# Patient Record
Sex: Male | Born: 1975 | Race: Black or African American | Hispanic: No | Marital: Single | Smoking: Never smoker
Health system: Southern US, Community
[De-identification: ages and names within clinical notes are randomized; demographics above are authoritative.]

## PROBLEM LIST (undated history)

## (undated) DIAGNOSIS — I10 Essential (primary) hypertension: Secondary | ICD-10-CM

## (undated) DIAGNOSIS — J45909 Unspecified asthma, uncomplicated: Secondary | ICD-10-CM

## (undated) DIAGNOSIS — E119 Type 2 diabetes mellitus without complications: Secondary | ICD-10-CM

## (undated) HISTORY — DX: Type 2 diabetes mellitus without complications: E11.9

## (undated) HISTORY — PX: DENTAL SURGERY: SHX609

## (undated) HISTORY — DX: Unspecified asthma, uncomplicated: J45.909

---

## 2015-01-03 ENCOUNTER — Encounter (HOSPITAL_COMMUNITY): Payer: Self-pay | Admitting: Family Medicine

## 2015-01-03 ENCOUNTER — Emergency Department (HOSPITAL_COMMUNITY)
Admission: EM | Admit: 2015-01-03 | Discharge: 2015-01-03 | Disposition: A | Discharge status: Home / Self Care | Payer: Self-pay | Attending: Emergency Medicine | Admitting: Emergency Medicine

## 2015-01-03 DIAGNOSIS — Z79899 Other long term (current) drug therapy: Secondary | ICD-10-CM | POA: Insufficient documentation

## 2015-01-03 DIAGNOSIS — I10 Essential (primary) hypertension: Secondary | ICD-10-CM | POA: Insufficient documentation

## 2015-01-03 HISTORY — DX: Essential (primary) hypertension: I10

## 2015-01-03 LAB — I-STAT CHEM 8, ED
BUN: 11 mg/dL (ref 6–20)
CHLORIDE: 100 mmol/L — AB (ref 101–111)
Calcium, Ion: 1.1 mmol/L — ABNORMAL LOW (ref 1.12–1.23)
Creatinine, Ser: 1.1 mg/dL (ref 0.61–1.24)
GLUCOSE: 102 mg/dL — AB (ref 65–99)
HEMATOCRIT: 44 % (ref 39.0–52.0)
Hemoglobin: 15 g/dL (ref 13.0–17.0)
POTASSIUM: 3.5 mmol/L (ref 3.5–5.1)
SODIUM: 138 mmol/L (ref 135–145)
TCO2: 24 mmol/L (ref 0–100)

## 2015-01-03 MED ORDER — LISINOPRIL-HYDROCHLOROTHIAZIDE 10-12.5 MG PO TABS: 1.0000 | ORAL_TABLET | Freq: Every day | ORAL | Status: DC

## 2015-01-03 MED ORDER — LISINOPRIL 10 MG PO TABS
10.0000 mg | ORAL_TABLET | Freq: Every day | ORAL | Status: DC
  Administered 2015-01-03: 10 mg via ORAL
  Filled 2015-01-03: qty 1

## 2015-01-03 NOTE — ED Provider Notes (Signed)
CSN: [id number]     Arrival date & time 01/03/15  1025 History   First MD Initiated Contact with Patient 01/03/15 1342     Chief Complaint  Patient presents with  . Hypertension   HPI Patient presents to the emergency room for evaluation of high blood pressure. Patient has a history of hypertension. He was living in [place]. When he was living there he was on medications for blood pressure. Patient states he has not been on them for a long period of time. He cannot recall what medications he used to take. He took his blood pressure at [facility] on Friday. His systolic blood pressure was greater than 200. Patient decided to come in for evaluation. Denies having any complaints. He denies chest pain or shortness of breath. He does not have any trouble with numbness or weakness or blurred vision. He otherwise feels fine but knows he shouldn't be taking medications for his blood pressure. He does not have a doctor in this area. Past Medical History  Diagnosis Date  . Hypertension    History reviewed. No pertinent past surgical history. History reviewed. No pertinent family history. History  Substance Use Topics  . Smoking status: Never Smoker   . Smokeless tobacco: Not on file  . Alcohol Use: No    Review of Systems  All other systems reviewed and are negative.     Allergies  Review of patient's allergies indicates no known allergies.  Home Medications   Prior to Admission medications   Medication Sig Start Date End Date Taking? Authorizing Provider  albuterol (PROVENTIL HFA;VENTOLIN HFA) 108 (90 BASE) MCG/ACT inhaler Inhale 1 puff into the lungs every 6 (six) hours as needed for wheezing or shortness of breath.   Yes Historical Provider, MD  budesonide-formoterol (SYMBICORT) 160-4.5 MCG/ACT inhaler Inhale 2 puffs into the lungs 2 (two) times daily.   Yes Historical Provider, MD  lisinopril-hydrochlorothiazide (PRINZIDE,ZESTORETIC) 10-12.5 MG per tablet Take 1 tablet by mouth daily.  01/03/15   Dorie Rank, MD   BP 199/114 mmHg  Pulse 77  Temp(Src) 97.8 F (36.6 C) (Oral)  Resp 18  SpO2 98% Physical Exam  Constitutional: He appears well-developed and well-nourished. No distress.  HENT:  Head: Normocephalic and atraumatic.  Right Ear: External ear normal.  Left Ear: External ear normal.  Eyes: Conjunctivae are normal. Right eye exhibits no discharge. Left eye exhibits no discharge. No scleral icterus.  Neck: Neck supple. No tracheal deviation present.  Cardiovascular: Normal rate, regular rhythm and normal heart sounds.   Pulmonary/Chest: Effort normal and breath sounds normal. No stridor. No respiratory distress. He has no wheezes. He has no rales.  Musculoskeletal: He exhibits no edema.  Neurological: He is alert. Cranial nerve deficit: no gross deficits.  Skin: Skin is warm and dry.  Patches of dry skin  Psychiatric: He has a normal mood and affect.  Nursing note and vitals reviewed.   ED Course  Procedures (including critical care time) Labs Review Labs Reviewed  I-STAT CHEM 8, ED - Abnormal; Notable for the following:    Chloride 100 (*)    Glucose, Bld 102 (*)    Calcium, Ion 1.10 (*)    All other components within normal limits    Imaging Review No results found.   EKG Interpretation   Date/Time:  Monday January 03 2015 14:34:00 EDT Ventricular Rate:  73 PR Interval:  190 QRS Duration: 94 QT Interval:  443 QTC Calculation: 488 R Axis:   -35 Text Interpretation:  Sinus rhythm Left ventricular hypertrophy Borderline  prolonged QT interval No old tracing to compare Confirmed by Diyana Starrett  MD-J,  Oiva Dibari ([id number]) on 01/03/2015 2:56:51 PM      MDM   Final diagnoses:  Essential hypertension    Pt with  asymptomatic hypertension.  Was supposed to be on meds but stopped taking them.  Will give dose of lisinopril.  Rx lisinopril HCTZ.  Referral to [facility].    Dorie Rank, MD 01/03/15 [phone]

## 2015-01-03 NOTE — ED Notes (Signed)
Pt is in stable condition upon d/c and ambulates from ED. 

## 2015-01-03 NOTE — ED Notes (Signed)
Pt sts he checked BP at [facility] and was above 200 sys. sts he feels fine. Denies any other problems.

## 2015-01-03 NOTE — Discharge Instructions (Signed)

## 2016-08-07 ENCOUNTER — Encounter (HOSPITAL_COMMUNITY): Payer: Self-pay | Admitting: Emergency Medicine

## 2016-08-07 ENCOUNTER — Emergency Department (HOSPITAL_COMMUNITY)
Admission: EM | Admit: 2016-08-07 | Discharge: 2016-08-07 | Disposition: A | Discharge status: Home / Self Care | Payer: PRIVATE HEALTH INSURANCE | Attending: Emergency Medicine | Admitting: Emergency Medicine

## 2016-08-07 DIAGNOSIS — Z760 Encounter for issue of repeat prescription: Secondary | ICD-10-CM | POA: Insufficient documentation

## 2016-08-07 DIAGNOSIS — I10 Essential (primary) hypertension: Secondary | ICD-10-CM | POA: Diagnosis not present

## 2016-08-07 MED ORDER — LISINOPRIL-HYDROCHLOROTHIAZIDE 20-25 MG PO TABS: 1.0000 | ORAL_TABLET | Freq: Every day | ORAL | 0 refills | Status: DC

## 2016-08-07 MED ORDER — AMLODIPINE BESYLATE 10 MG PO TABS: 10.0000 mg | ORAL_TABLET | Freq: Every day | ORAL | 0 refills | Status: DC

## 2016-08-07 MED ORDER — CLONIDINE HCL 0.1 MG PO TABS: 0.1000 mg | ORAL_TABLET | Freq: Two times a day (BID) | ORAL | 1 refills | Status: DC

## 2016-08-07 NOTE — ED Notes (Signed)
Pt states all

## 2016-08-07 NOTE — Progress Notes (Signed)
Edward Carr [name], RN, BSN, [name] set up appointment with [name], NP on 1/24 @ 0900.  Spoke with pt at bedside and advised to please arrive 15 min early and take a picture ID and your current medications.  Pt verbalizes understanding of keeping appointment.

## 2016-08-07 NOTE — ED Triage Notes (Signed)
Pt states "Im here to renew my medicine" I had a primary doctor but he shut down in September. Pt states "i just need to fill three blood pressure pills". "ive been out of them since last night". Pt denies symptoms, just requesting  Prescriptions to be filled. Pt takes amlodipine, clonidine, and lisinopril. Pt also requesting resources to find PCP.

## 2016-08-07 NOTE — ED Provider Notes (Signed)
[facility] DEPT Provider Note   CSN: [id number] Arrival date & time: 08/07/16  [id number]     History   Chief Complaint Chief Complaint  Patient presents with  . Medication Refill    HPI Tyvion Torrealba is a 41 y.o. male.  Patient presents to the ED with a chief complaint of needing his BP medications refilled.  He states that he ran out last night.  He doesn't have a PCP.  He denies any symptoms.  There are no modifying factors.   The history is provided by the patient. No language interpreter was used.    Past Medical History:  Diagnosis Date  . Hypertension     There are no active problems to display for this patient.   History reviewed. No pertinent surgical history.     Home Medications    Prior to Admission medications   Medication Sig Start Date End Date Taking? Authorizing Provider  albuterol (PROVENTIL HFA;VENTOLIN HFA) 108 (90 BASE) MCG/ACT inhaler Inhale 1 puff into the lungs every 6 (six) hours as needed for wheezing or shortness of breath.    Historical Provider, MD  budesonide-formoterol (SYMBICORT) 160-4.5 MCG/ACT inhaler Inhale 2 puffs into the lungs 2 (two) times daily.    Historical Provider, MD  lisinopril-hydrochlorothiazide (PRINZIDE,ZESTORETIC) 10-12.5 MG per tablet Take 1 tablet by mouth daily. 01/03/15   Dorie Rank, MD    Family History No family history on file.  Social History Social History  Substance Use Topics  . Smoking status: Never Smoker  . Smokeless tobacco: Not on file  . Alcohol use No     Allergies   Patient has no known allergies.   Review of Systems Review of Systems  Constitutional: Negative for chills and fever.  Respiratory: Negative for shortness of breath.   Cardiovascular: Negative for chest pain.  Gastrointestinal: Negative for constipation, diarrhea, nausea and vomiting.  Genitourinary: Negative for dysuria.     Physical Exam Updated Vital Signs BP (!) 165/105   Pulse 90   Temp 98.2 F (36.8 C) (Oral)    Resp 16   Ht 5\' 5"  (1.651 m)   SpO2 100%   Physical Exam  Constitutional: He is oriented to person, place, and time. No distress.  HENT:  Head: Normocephalic and atraumatic.  Eyes: Conjunctivae and EOM are normal. Pupils are equal, round, and reactive to light.  Neck: No tracheal deviation present.  Cardiovascular: Normal rate.   Pulmonary/Chest: Effort normal. No respiratory distress.  Abdominal: Soft.  Musculoskeletal: Normal range of motion.  Neurological: He is alert and oriented to person, place, and time.  Skin: Skin is warm and dry. He is not diaphoretic.  Psychiatric: Judgment normal.  Nursing note and vitals reviewed.    ED Treatments / Results  Labs (all labs ordered are listed, but only abnormal results are displayed) Labs Reviewed - No data to display  EKG  EKG Interpretation None       Radiology No results found.  Procedures Procedures (including critical care time)  Medications Ordered in ED Medications - No data to display   Initial Impression / Assessment and Plan / ED Course  I have reviewed the triage vital signs and the nursing notes.  Pertinent labs & imaging results that were available during my care of the patient were reviewed by me and considered in my medical decision making (see chart for details).  Clinical Course     Patient here for medication refill.  Case management saw patient and has set up PCP  follow-up.  Meds refilled.  Final Clinical Impressions(s) / ED Diagnoses   Final diagnoses:  Medication refill    New Prescriptions New Prescriptions   No medications on file     Montine Circle, PA-C 08/07/16 [place], MD 08/07/16 2008

## 2016-08-22 ENCOUNTER — Encounter: Payer: Self-pay | Admitting: Family Medicine

## 2016-08-22 ENCOUNTER — Ambulatory Visit (INDEPENDENT_AMBULATORY_CARE_PROVIDER_SITE_OTHER): Payer: PRIVATE HEALTH INSURANCE | Admitting: Family Medicine

## 2016-08-22 DIAGNOSIS — Z23 Encounter for immunization: Secondary | ICD-10-CM

## 2016-08-22 DIAGNOSIS — J45909 Unspecified asthma, uncomplicated: Secondary | ICD-10-CM | POA: Diagnosis not present

## 2016-08-22 DIAGNOSIS — I10 Essential (primary) hypertension: Secondary | ICD-10-CM

## 2016-08-22 DIAGNOSIS — L309 Dermatitis, unspecified: Secondary | ICD-10-CM

## 2016-08-22 HISTORY — DX: Essential (primary) hypertension: I10

## 2016-08-22 LAB — COMPLETE METABOLIC PANEL WITH GFR
ALBUMIN: 4 g/dL (ref 3.6–5.1)
ALK PHOS: 62 U/L (ref 40–115)
ALT: 15 U/L (ref 9–46)
AST: 16 U/L (ref 10–40)
BILIRUBIN TOTAL: 0.6 mg/dL (ref 0.2–1.2)
BUN: 16 mg/dL (ref 7–25)
CO2: 27 mmol/L (ref 20–31)
Calcium: 9.8 mg/dL (ref 8.6–10.3)
Chloride: 100 mmol/L (ref 98–110)
Creat: 1.2 mg/dL (ref 0.60–1.35)
GFR, EST NON AFRICAN AMERICAN: 76 mL/min (ref >=60)
GFR, Est African American: 88 mL/min (ref >=60)
Glucose, Bld: 97 mg/dL (ref 65–99)
Potassium: 4.1 mmol/L (ref 3.5–5.3)
SODIUM: 137 mmol/L (ref 135–146)
TOTAL PROTEIN: 7.2 g/dL (ref 6.1–8.1)

## 2016-08-22 LAB — CBC WITH DIFFERENTIAL/PLATELET
Basophils Absolute: 0 cells/uL (ref 0–200)
Basophils Relative: 0 %
EOS ABS: 258 {cells}/uL (ref 15–500)
Eosinophils Relative: 3 %
HEMATOCRIT: 40.7 % (ref 38.5–50.0)
Hemoglobin: 13.4 g/dL (ref 13.2–17.1)
LYMPHS PCT: 32 %
Lymphs Abs: 2752 cells/uL (ref 850–3900)
MCH: 27 pg (ref 27.0–33.0)
MCHC: 32.9 g/dL (ref 32.0–36.0)
MCV: 82.1 fL (ref 80.0–100.0)
MPV: 8.9 fL (ref 7.5–12.5)
Monocytes Absolute: 516 cells/uL (ref 200–950)
Monocytes Relative: 6 %
NEUTROS PCT: 59 %
Neutro Abs: 5074 cells/uL (ref 1500–7800)
Platelets: 323 10*3/uL (ref 140–400)
RBC: 4.96 MIL/uL (ref 4.20–5.80)
RDW: 15.5 % — AB (ref 11.0–15.0)
WBC: 8.6 10*3/uL (ref 3.8–10.8)

## 2016-08-22 LAB — POCT URINALYSIS DIP (DEVICE)
Glucose, UA: NEGATIVE mg/dL
Hgb urine dipstick: NEGATIVE
Ketones, ur: NEGATIVE mg/dL
LEUKOCYTES UA: NEGATIVE
Nitrite: NEGATIVE
PROTEIN: NEGATIVE mg/dL
Specific Gravity, Urine: 1.025 (ref 1.005–1.030)
Urobilinogen, UA: 0.2 mg/dL (ref 0.0–1.0)
pH: 5.5 (ref 5.0–8.0)

## 2016-08-22 LAB — TSH: TSH: 2.9 mIU/L (ref 0.40–4.50)

## 2016-08-22 LAB — LIPID PANEL
Cholesterol: 146 mg/dL (ref <200)
HDL: 38 mg/dL — AB (ref >40)
LDL CALC: 87 mg/dL (ref <100)
Total CHOL/HDL Ratio: 3.8 Ratio (ref <5.0)
Triglycerides: 107 mg/dL (ref <150)
VLDL: 21 mg/dL (ref <30)

## 2016-08-22 LAB — POCT GLYCOSYLATED HEMOGLOBIN (HGB A1C): HEMOGLOBIN A1C: 5.9

## 2016-08-22 MED ORDER — TRIAMCINOLONE ACETONIDE 0.1 % EX CREA: 1.0000 "application " | TOPICAL_CREAM | Freq: Two times a day (BID) | CUTANEOUS | 3 refills | Status: DC

## 2016-08-22 MED ORDER — ALBUTEROL SULFATE HFA 108 (90 BASE) MCG/ACT IN AERS: 1.0000 | INHALATION_SPRAY | Freq: Four times a day (QID) | RESPIRATORY_TRACT | 1 refills | Status: DC | PRN

## 2016-08-22 MED ORDER — CLONIDINE HCL 0.1 MG PO TABS: 0.1000 mg | ORAL_TABLET | Freq: Two times a day (BID) | ORAL | 1 refills | Status: DC

## 2016-08-22 MED ORDER — AMLODIPINE BESYLATE 10 MG PO TABS: 10.0000 mg | ORAL_TABLET | Freq: Every day | ORAL | 1 refills | Status: DC

## 2016-08-22 MED ORDER — LISINOPRIL-HYDROCHLOROTHIAZIDE 20-25 MG PO TABS: 1.0000 | ORAL_TABLET | Freq: Every day | ORAL | 1 refills | Status: DC

## 2016-08-22 NOTE — Progress Notes (Signed)
Subjective:    Patient ID: Edward Carr, male    DOB: Jan 12, 1976, 41 y.o.   MRN: [id number] Edward Carr, a 41 year old male with a history of hypertension, asthma, and eczema presents to establish care. He says that he was a patient of Dr. Jimmye Norman, general medicine prior to closing.    Hypertension  This is a chronic problem. The current episode started more than 1 year ago. The problem has been gradually improving since onset. The problem is controlled. Pertinent negatives include no blurred vision, chest pain, headaches, malaise/fatigue, neck pain, orthopnea, palpitations, peripheral edema, PND, shortness of breath or sweats. There are no associated agents to hypertension. Risk factors for coronary artery disease include obesity, male gender and sedentary lifestyle. Past treatments include ACE inhibitors and calcium channel blockers. The current treatment provides moderate improvement. Compliance problems include diet.  There is no history of angina, kidney disease, CAD/MI, CVA, heart failure, left ventricular hypertrophy or PVD. There is no history of chronic renal disease, coarctation of the aorta, sleep apnea or a thyroid problem.  Asthma  There is no chest tightness, cough, difficulty breathing, frequent throat clearing, hemoptysis, hoarse voice, shortness of breath, sputum production or wheezing. This is a chronic problem. The current episode started more than 1 year ago. The problem occurs rarely. The problem has been unchanged. Pertinent negatives include no appetite change, chest pain, dyspnea on exertion, ear congestion, ear pain, fever, headaches, malaise/fatigue, nasal congestion, orthopnea, PND, sneezing, sore throat, sweats or weight loss. His symptoms are aggravated by nothing. His symptoms are alleviated by nothing. He reports significant improvement on treatment. His past medical history is significant for asthma.   Past Medical History:  Diagnosis Date  . Asthma   .  Hypertension    Immunization History  Administered Date(s) Administered  . Influenza,inj,Quad PF,36+ Mos 08/22/2016  . Tdap 08/22/2016  No Known Allergies  Social History   Social History  . Marital status: Single    Spouse name: N/A  . Number of children: N/A  . Years of education: N/A   Occupational History  . Not on file.   Social History Main Topics  . Smoking status: Never Smoker  . Smokeless tobacco: Never Used  . Alcohol use No  . Drug use: No  . Sexual activity: No   Other Topics Concern  . Not on file   Social History Narrative  . No narrative on file    Review of Systems  Constitutional: Negative.  Negative for appetite change, fever, malaise/fatigue and weight loss.  HENT: Negative.  Negative for ear pain, hoarse voice, sneezing and sore throat.   Eyes: Negative.  Negative for blurred vision.  Respiratory: Negative.  Negative for cough, hemoptysis, sputum production, shortness of breath and wheezing.   Cardiovascular: Negative.  Negative for chest pain, dyspnea on exertion, palpitations, orthopnea and PND.  Gastrointestinal: Negative.   Endocrine: Negative.  Negative for polydipsia, polyphagia and polyuria.  Genitourinary: Negative.   Musculoskeletal: Negative.  Negative for neck pain.  Skin: Positive for rash.       History of eczema  Allergic/Immunologic: Negative.  Negative for immunocompromised state.  Neurological: Negative.  Negative for headaches.  Hematological: Negative.   Psychiatric/Behavioral: Negative.        Objective:   Physical Exam  Constitutional: He is oriented to person, place, and time. He appears well-developed and well-nourished.  HENT:  Head: Normocephalic and atraumatic.  Right Ear: External ear normal.  Left Ear: External ear normal.  Nose: Nose normal.  Eyes: Conjunctivae and EOM are normal. Pupils are equal, round, and reactive to light.  Neck: Normal range of motion. Neck supple.  Cardiovascular: Normal rate, regular  rhythm, normal heart sounds and intact distal pulses.   Pulmonary/Chest: Effort normal and breath sounds normal.  Abdominal: Soft. Bowel sounds are normal.  Increased abdominal girth  Musculoskeletal: Normal range of motion.  Neurological: He is alert and oriented to person, place, and time. He has normal reflexes.  Skin: Skin is warm and dry. Rash noted. Rash is maculopapular (primarily to face).  Psychiatric: He has a normal mood and affect. His behavior is normal. Judgment and thought content normal.       BP (!) 144/95 (BP Location: Left Arm, Patient Position: Sitting, Cuff Size: Large)   Pulse 87   Temp 98.3 F (36.8 C) (Oral)   Resp 18   Ht 5\' 6"  (1.676 m)   Wt (!) 303 lb 9.6 oz (137.7 kg)   SpO2 99%   BMI 49.00 kg/m  Assessment & Plan:  1. Essential hypertension Blood pressure above goal on current medication regimen. The patient is asked to make an attempt to improve diet and exercise patterns to aid in medical management of this problem. - COMPLETE METABOLIC PANEL WITH GFR - cloNIDine (CATAPRES) 0.1 MG tablet; Take 1 tablet (0.1 mg total) by mouth 2 (two) times daily.  Dispense: 180 tablet; Refill: 1 - lisinopril-hydrochlorothiazide (PRINZIDE,ZESTORETIC) 20-25 MG tablet; Take 1 tablet by mouth daily.  Dispense: 90 tablet; Refill: 1 - amLODipine (NORVASC) 10 MG tablet; Take 1 tablet (10 mg total) by mouth daily.  Dispense: 90 tablet; Refill: 1 - CBC with Differential - Lipid Panel  2. Mild asthma without complication, unspecified whether persistent - albuterol (PROVENTIL HFA;VENTOLIN HFA) 108 (90 Base) MCG/ACT inhaler; Inhale 1 puff into the lungs every 6 (six) hours as needed for wheezing or shortness of breath.  Dispense: 1 Inhaler; Refill: 1  3. Severe obesity (BMI >= 40) (HCC) Recommend a lowfat, low carbohydrate diet divided over 5-6 small meals, increase water intake to 6-8 glasses, and 150 minutes per week of cardiovascular exercise.   - HgB A1c - TSH - Lipid  Panel  4. Need for immunization against influenza  - Flu Vaccine QUAD 36+ mos IM (Fluarix)  5. Need for Tdap vaccination  - Tdap vaccine greater than or equal to 7yo IM  6. Eczema, unspecified type - triamcinolone cream (KENALOG) 0.1 %; Apply 1 application topically 2 (two) times daily.  Dispense: 30 g; Refill: 3   RTC: 3 months for follow up of hypertension   Emmerich Cryer M, FNP    The patient was given clear instructions to go to ER or return to medical center if symptoms do not improve, worsen or new problems develop. The patient verbalized understanding. Will notify patient with laboratory results.

## 2016-08-22 NOTE — Patient Instructions (Addendum)
Obesity, Adult Introduction Obesity is having too much body fat. If you have a BMI of 30 or more, you are obese. BMI is a number that explains how much body fat you have. Obesity is often caused by taking in (consuming) more calories than your body uses. Obesity can cause serious health problems. Changing your lifestyle can help to treat obesity. Follow these instructions at home: Eating and drinking  Follow advice from your doctor about what to eat and drink. Your doctor may tell you to:  Cut down on (limit) fast foods, sweets, and processed snack foods.  Choose low-fat options. For example, choose low-fat milk instead of whole milk.  Eat 5 or more servings of fruits or vegetables every day.  Eat at home more often. This gives you more control over what you eat.  Choose healthy foods when you eat out.  Learn what a healthy portion size is. A portion size is the amount of a certain food that is healthy for you to eat at one time. This is different for each person.  Keep low-fat snacks available.  Avoid sugary drinks. These include soda, fruit juice, iced tea that is sweetened with sugar, and flavored milk.  Eat a healthy breakfast.  Drink enough water to keep your pee (urine) clear or pale yellow.  Do not go without eating for long periods of time (do not fast).  Do not go on popular or trendy diets (fad diets). Physical Activity  Exercise often, as told by your doctor. Ask your doctor:  What types of exercise are safe for you.  How often you should exercise.  Warm up and stretch before being active.  Do slow stretching after being active (cool down).  Rest between times of being active. Lifestyle  Limit how much time you spend in front of your TV, computer, or video game system (be less sedentary).  Find ways to reward yourself that do not involve food.  Limit alcohol intake to no more than 1 drink a day for nonpregnant women and 2 drinks a day for men. One drink  equals 12 oz of beer, 5 oz of wine, or 1 oz of hard liquor. General instructions  Keep a weight loss journal. This can help you keep track of:  The food that you eat.  The exercise that you do.  Take over-the-counter and prescription medicines only as told by your doctor.  Take vitamins and supplements only as told by your doctor.  Think about joining a support group. Your doctor may be able to help with this.  Keep all follow-up visits as told by your doctor. This is important. Contact a doctor if:  You cannot meet your weight loss goal after you have changed your diet and lifestyle for 6 weeks. This information is not intended to replace advice given to you by your health care provider. Make sure you discuss any questions you have with your health care provider. Document Released: 10/08/2011 Document Revised: 12/22/2015 Document Reviewed: 05/04/2015  2017 Elsevier  Asthma, Adult Asthma is a condition of the lungs in which the airways tighten and narrow. Asthma can make it hard to breathe. Asthma cannot be cured, but medicine and lifestyle changes can help control it. Asthma may be started (triggered) by:  Animal skin flakes (dander).  Dust.  Cockroaches.  Pollen.  Mold.  Smoke.  Cleaning products.  Hair sprays or aerosol sprays.  Paint fumes or strong smells.  Cold air, weather changes, and winds.  Crying or laughing  hard.  Stress.  Certain medicines or drugs.  Foods, such as dried fruit, potato chips, and sparkling grape juice.  Infections or conditions (colds, flu).  Exercise.  Certain medical conditions or diseases.  Exercise or tiring activities. Follow these instructions at home:  Take medicine as told by your doctor.  Use a peak flow meter as told by your doctor. A peak flow meter is a tool that measures how well the lungs are working.  Record and keep track of the peak flow meter's readings.  Understand and use the asthma action plan. An  asthma action plan is a written plan for taking care of your asthma and treating your attacks.  To help prevent asthma attacks:  Do not smoke. Stay away from secondhand smoke.  Change your heating and air conditioning filter often.  Limit your use of fireplaces and wood stoves.  Get rid of pests (such as roaches and mice) and their droppings.  Throw away plants if you see mold on them.  Clean your floors. Dust regularly. Use cleaning products that do not smell.  Have someone vacuum when you are not home. Use a vacuum cleaner with a HEPA filter if possible.  Replace carpet with wood, tile, or vinyl flooring. Carpet can trap animal skin flakes and dust.  Use allergy-proof pillows, mattress covers, and box spring covers.  Wash bed sheets and blankets every week in hot water and dry them in a dryer.  Use blankets that are made of polyester or cotton.  Clean bathrooms and kitchens with bleach. If possible, have someone repaint the walls in these rooms with mold-resistant paint. Keep out of the rooms that are being cleaned and painted.  Wash hands often. Contact a doctor if:  You have make a whistling sound when breaking (wheeze), have shortness of breath, or have a cough even if taking medicine to prevent attacks.  The colored mucus you cough up (sputum) is thicker than usual.  The colored mucus you cough up changes from clear or white to yellow, green, gray, or bloody.  You have problems from the medicine you are taking such as:  A rash.  Itching.  Swelling.  Trouble breathing.  You need reliever medicines more than 2-3 times a week.  Your peak flow measurement is still at 50-79% of your personal best after following the action plan for 1 hour.  You have a fever. Get help right away if:  You seem to be worse and are not responding to medicine during an asthma attack.  You are short of breath even at rest.  You get short of breath when doing very little  activity.  You have trouble eating, drinking, or talking.  You have chest pain.  You have a fast heartbeat.  Your lips or fingernails start to turn blue.  You are light-headed, dizzy, or faint.  Your peak flow is less than 50% of your personal best. This information is not intended to replace advice given to you by your health care provider. Make sure you discuss any questions you have with your health care provider. Document Released: 01/02/2008 Document Revised: 12/22/2015 Document Reviewed: 02/12/2013 Elsevier Interactive Patient Education  2017 [employer]  Atopic Dermatitis Atopic dermatitis is a skin disorder that causes inflammation of the skin. This is the most common type of eczema. Eczema is a group of skin conditions that cause the skin to be itchy, red, and swollen. This condition is generally worse during the cooler winter months and often improves during  the warm summer months. Symptoms can vary from person to person. Atopic dermatitis usually starts showing signs in infancy and can last through adulthood. This condition cannot be passed from one person to another (non-contagious), but is more common in families. Atopic dermatitis may not always be present. When it is present, it is called a flare-up. What are the causes? The exact cause of this condition is not known. Flare-ups of the condition may be triggered by:  Contact with something you are sensitive or allergic to.  Stress.  Certain foods.  Extremely hot or cold weather.  Harsh chemicals and soaps.  Dry air.  Chlorine. What increases the risk? This condition is more likely to develop in people who have a personal history or family history of eczema, allergies, asthma, or hay fever. What are the signs or symptoms? Symptoms of this condition include:  Dry, scaly skin.  Red, itchy rash.  Itchiness, which can be severe. This may occur before the skin rash. This can make sleeping difficult.  Skin  thickening and cracking can occur over time. How is this diagnosed? This condition is diagnosed based on your symptoms, a medical history, and a physical exam. How is this treated? There is no cure for this condition, but symptoms can usually be controlled. Treatment focuses on:  Controlling the itching and scratching. You may be given medicines, such as antihistamines or steroid creams.  Limiting exposure to things that you are sensitive or allergic to (allergens).  Recognizing situations that cause stress and developing a plan to manage stress. If your atopic dermatitis does not get better with medicines or is all over your body (widespread) , a treatment using a specific type of light (phototherapy) may be used. Follow these instructions at home: Skin care  Keep your skin well-moisturized. This seals in moisture and help prevent dryness.  Use unscented lotions that have petroleum in them.  Avoid lotions that contain alcohol and water. They can dry the skin.  Keep baths or showers short (less than 5 minutes) in warm water. Do not use hot water.  Use mild, unscented cleansers for bathing. Avoid soap and bubble bath.  Apply a moisturizer to your skin right after a bath or shower.   Do not apply anything to your skin without checking with your health care provider. General instructions  Dress in clothes made of cotton or cotton blends. Dress lightly because heat increases itching.  When washing your clothes, rinse your clothes twice so all of the soap is removed.  Avoid any triggers that can cause a flare-up.  Try to manage your stress.  Keep your fingernails cut short.  Avoid scratching. Scratching makes the rash and itching worse. It may also result in a skin infection (impetigo) due to a break in the skin caused by scratching.  Take or apply over-the-counter and prescription medicines only as told by your health care provider.  Keep all follow-up visits as told by your  health care provider. This is important.  Do not be around people who have cold sores or fever blisters. If you get the infection, it may cause your atopic dermatitis to worsen. Contact a health care provider if:  Your itching interferes with sleep.  Your rash gets worse or is not better within one week of starting treatment.  You have a fever.  You have a rash flare-up after having contact with someone who has cold sores or fever blisters. Get help right away if:  You develop pus or soft  yellow scabs in the rash area. Summary  This condition causes a red rash and itchy, dry, scaly skin.  Treatment focuses on controlling the itching and scratching, limiting exposure to things that you are sensitive or allergic to (allergens), and recognizing situations that cause stress and developing a plan to manage stress.  Keep your skin well-moisturized.  Keep baths or showers less than 5 minutes. This information is not intended to replace advice given to you by your health care provider. Make sure you discuss any questions you have with your health care provider. Document Released: 07/13/2000 Document Revised: 12/22/2015 Document Reviewed: 02/16/2013 Elsevier Interactive Patient Education  2017 [employer].  Managing Your Hypertension Hypertension is commonly called high blood pressure. Blood pressure is a measurement of how strongly your blood is pressing against the walls of your arteries. Arteries are blood vessels that carry blood from your heart throughout your body. Blood pressure does not stay the same. It rises when you are active, excited, or nervous. It lowers when you are sleeping or relaxed. If the numbers that measure your blood pressure stay above normal most of the time, you are at risk for health problems. Hypertension is a long-term (chronic) condition in which blood pressure is elevated. This condition often has no signs or symptoms. The cause of the condition is usually not  known. What are blood pressure readings? A blood pressure reading is recorded as two numbers, such as "120 over 80" (or 120/80). The first ("top") number is called the systolic pressure. It is a measure of the pressure in your arteries as the heart beats. The second ("bottom") number is called the diastolic pressure. It is a measure of the pressure in your arteries as the heart relaxes between beats. What does my blood pressure reading mean? Blood pressure is classified into four stages. Based on your blood pressure reading, your health care provider may use the following stages to determine what type of treatment, if any, is needed. Systolic pressure and diastolic pressure are measured in a unit called mm Hg. Normal  Systolic pressure: below 123456.  Diastolic pressure: below 80. Prehypertension  Systolic pressure: 123456.  Diastolic pressure: [id number]. Hypertension stage 1  Systolic pressure: A999333.  Diastolic pressure: A999333. Hypertension stage 2  Systolic pressure: 0000000 or above.  Diastolic pressure: 123XX123 or above. What health risks are associated with hypertension? Managing your hypertension is an important responsibility. Uncontrolled hypertension can lead to:  A heart attack.  A stroke.  A weakened blood vessel (aneurysm).  Heart failure.  Kidney damage.  Eye damage.  Metabolic syndrome.  Memory and concentration problems. What changes can I make to manage my hypertension? Hypertension can be managed effectively by making lifestyle changes and possibly by taking medicines. Your health care provider will help you come up with a plan to bring your blood pressure within a normal range. Your plan should include the following: Monitoring  Monitor your blood pressure at home as told by your health care provider. Your personal target blood pressure may vary depending on your medical conditions, your age, and other factors.  Have your blood pressure rechecked as told by  your health care provider. Lifestyle  Lose weight if necessary.  Get at least 30-45 minutes of aerobic exercise at least 4 times a week.  Do not use any products that contain nicotine or tobacco, such as cigarettes and e-cigarettes. If you need help quitting, ask your health care provider.  Learn ways to reduce stress.  Control any chronic  conditions, such as high cholesterol or diabetes. Eating and drinking  Follow the DASH diet. This diet is high in fruits, vegetables, and whole grains. It is low in salt, red meat, and added sugars.  Keep your sodium intake below 2,300 mg per day.  Limit alcoholic beverages. Communication  Review all the medicines you take with your health care provider because there may be side effects or interactions.  Talk with your health care provider about your diet, exercise habits, and other lifestyle factors that may be contributing to hypertension.  See your health care provider regularly. Your health care provider can help you create and adjust your plan for managing hypertension. Will I need medicine to control my blood pressure? Your health care provider may prescribe medicine if lifestyle changes are not enough to get your blood pressure under control, and if one of the following is true:  You are 45-13 years of age, and your systolic blood pressure is 140 or higher.  You are 21 years of age or older, and your systolic blood pressure is 150 or higher.  Your diastolic blood pressure is 90 or higher.  You have diabetes, and your systolic blood pressure is over [id number] or your diastolic blood pressure is over 90.  You have kidney disease, and your blood pressure is above 140/90.  You have heart disease or a history of stroke, and your blood pressure is 140/90 or higher. Take medicines only as told by your health care provider. Follow the directions carefully. Blood pressure medicines must be taken as prescribed. The medicine does not work as well when  you skip doses. Skipping doses also puts you at risk for problems. Contact a health care provider if:  You think you are having a reaction to medicines you have taken.  You have repeated (recurrent) headaches.  You feel dizzy.  You have swelling in your ankles.  You have trouble with your vision. Get help right away if:  You develop a severe headache or confusion.  You have unusual weakness or numbness, or you feel faint.  You have severe pain in your chest or abdomen.  You vomit repeatedly.  You have trouble breathing. This information is not intended to replace advice given to you by your health care provider. Make sure you discuss any questions you have with your health care provider. Document Released: 04/09/2012 Document Revised: 03/20/2016 Document Reviewed: 10/14/2015 Elsevier Interactive Patient Education  2017 [employer].

## 2016-11-20 ENCOUNTER — Encounter: Payer: Self-pay | Admitting: Family Medicine

## 2016-11-20 ENCOUNTER — Ambulatory Visit (INDEPENDENT_AMBULATORY_CARE_PROVIDER_SITE_OTHER): Payer: Self-pay | Admitting: Family Medicine

## 2016-11-20 VITALS — BP 129/79 | HR 78 | Temp 98.0°F | Resp 16 | Ht 66.0 in | Wt 301.0 lb

## 2016-11-20 DIAGNOSIS — R7303 Prediabetes: Secondary | ICD-10-CM

## 2016-11-20 DIAGNOSIS — Z114 Encounter for screening for human immunodeficiency virus [HIV]: Secondary | ICD-10-CM

## 2016-11-20 DIAGNOSIS — L309 Dermatitis, unspecified: Secondary | ICD-10-CM

## 2016-11-20 DIAGNOSIS — J45909 Unspecified asthma, uncomplicated: Secondary | ICD-10-CM

## 2016-11-20 DIAGNOSIS — I10 Essential (primary) hypertension: Secondary | ICD-10-CM

## 2016-11-20 LAB — POCT GLYCOSYLATED HEMOGLOBIN (HGB A1C): Hemoglobin A1C: 5.9

## 2016-11-20 LAB — BASIC METABOLIC PANEL WITH GFR
BUN: 13 mg/dL (ref 7–25)
CALCIUM: 9.7 mg/dL (ref 8.6–10.3)
CHLORIDE: 101 mmol/L (ref 98–110)
CO2: 25 mmol/L (ref 20–31)
Creat: 1.18 mg/dL (ref 0.60–1.35)
GFR, Est African American: >89 mL/min (ref >=60)
GFR, Est Non African American: 78 mL/min (ref >=60)
GLUCOSE: 98 mg/dL (ref 65–99)
Potassium: 3.9 mmol/L (ref 3.5–5.3)
SODIUM: 138 mmol/L (ref 135–146)

## 2016-11-20 LAB — POCT URINALYSIS DIP (DEVICE)
GLUCOSE, UA: NEGATIVE mg/dL
Hgb urine dipstick: NEGATIVE
KETONES UR: NEGATIVE mg/dL
Leukocytes, UA: NEGATIVE
Nitrite: NEGATIVE
PH: 5.5 (ref 5.0–8.0)
PROTEIN: NEGATIVE mg/dL
Specific Gravity, Urine: 1.025 (ref 1.005–1.030)
Urobilinogen, UA: 1 mg/dL (ref 0.0–1.0)

## 2016-11-20 MED ORDER — ALBUTEROL SULFATE HFA 108 (90 BASE) MCG/ACT IN AERS: 1.0000 | INHALATION_SPRAY | Freq: Four times a day (QID) | RESPIRATORY_TRACT | 1 refills | Status: DC | PRN

## 2016-11-20 MED ORDER — TRIAMCINOLONE ACETONIDE 0.1 % EX CREA: 1.0000 "application " | TOPICAL_CREAM | Freq: Two times a day (BID) | CUTANEOUS | 3 refills | Status: DC

## 2016-11-20 NOTE — Progress Notes (Signed)
Subjective:    Patient ID: Edward Carr, male    DOB: 01-20-1976, 41 y.o.   MRN: [id number] Mr. Elieser Tetrick, a 41 year old male with a history of hypertension, asthma, and eczema presents for a follow up of chronic conditions.   Hypertension  This is a chronic problem. The current episode started more than 1 year ago. The problem has been gradually improving since onset. The problem is controlled. Pertinent negatives include no blurred vision, chest pain, malaise/fatigue, neck pain, orthopnea, peripheral edema or PND. There are no associated agents to hypertension. Risk factors for coronary artery disease include obesity, male gender and sedentary lifestyle. Past treatments include ACE inhibitors and calcium channel blockers. The current treatment provides moderate improvement. Compliance problems include diet.  There is no history of angina, kidney disease, CAD/MI, CVA, heart failure, left ventricular hypertrophy or PVD. There is no history of chronic renal disease, coarctation of the aorta, sleep apnea or a thyroid problem.  Asthma  There is no chest tightness, difficulty breathing, frequent throat clearing, hoarse voice or sputum production. This is a chronic problem. The current episode started more than 1 year ago. The problem occurs rarely. The problem has been unchanged. Pertinent negatives include no appetite change, chest pain, dyspnea on exertion, malaise/fatigue, orthopnea, PND or sneezing. His symptoms are aggravated by nothing. His symptoms are alleviated by nothing. He reports significant improvement on treatment.   Past Medical History:  Diagnosis Date  . Asthma   . Hypertension    Immunization History  Administered Date(s) Administered  . Influenza,inj,Quad PF,36+ Mos 08/22/2016  . Tdap 08/22/2016  No Known Allergies  Social History   Social History  . Marital status: Single    Spouse name: N/A  . Number of children: N/A  . Years of education: N/A   Occupational History   . Not on file.   Social History Main Topics  . Smoking status: Never Smoker  . Smokeless tobacco: Never Used  . Alcohol use No  . Drug use: No  . Sexual activity: No   Other Topics Concern  . Not on file   Social History Narrative  . No narrative on file    Review of Systems  Constitutional: Negative.  Negative for appetite change and malaise/fatigue.  HENT: Negative.  Negative for hoarse voice and sneezing.   Eyes: Negative.  Negative for blurred vision.  Respiratory: Negative.  Negative for apnea and sputum production.   Cardiovascular: Negative.  Negative for chest pain, dyspnea on exertion, orthopnea, leg swelling and PND.  Gastrointestinal: Negative.   Endocrine: Negative.  Negative for polydipsia, polyphagia and polyuria.  Genitourinary: Negative.   Musculoskeletal: Negative.  Negative for neck pain.  Skin:       History of eczema; primarily to face  Allergic/Immunologic: Negative.  Negative for immunocompromised state.  Neurological: Negative.   Hematological: Negative.   Psychiatric/Behavioral: Negative.        Objective:   Physical Exam  Constitutional: He is oriented to person, place, and time. He appears well-developed and well-nourished.  HENT:  Head: Normocephalic and atraumatic.  Right Ear: External ear normal.  Left Ear: External ear normal.  Nose: Nose normal.  Eyes: Conjunctivae and EOM are normal. Pupils are equal, round, and reactive to light.  Neck: Normal range of motion. Neck supple.  Cardiovascular: Normal rate, regular rhythm, normal heart sounds and intact distal pulses.   Pulmonary/Chest: Effort normal and breath sounds normal.  Abdominal: Soft. Bowel sounds are normal.  Increased abdominal girth  Musculoskeletal: Normal range of motion.  Neurological: He is alert and oriented to person, place, and time. He has normal reflexes.  Skin: Skin is warm and dry. Rash noted. Rash is maculopapular (primarily to face).  Hypopigmented areas to  face. Erythematous macular rash to face.   Psychiatric: He has a normal mood and affect. His behavior is normal. Judgment and thought content normal.       BP 129/79 (BP Location: Left Arm, Patient Position: Sitting, Cuff Size: Large)   Pulse 78   Temp 98 F (36.7 C) (Oral)   Resp 16   Ht 5\' 6"  (1.676 m)   Wt (!) 301 lb (136.5 kg)   SpO2 98%   BMI 48.58 kg/m  Assessment & Plan:  1. Essential hypertension Blood pressure is at goal on current medication regimen. Will continue medications at current dosages. - BASIC METABOLIC PANEL WITH GFR - Microalbumin/Creatinine Ratio, Urine  2. Prediabetes Recommend a lowfat, low carbohydrate diet divided over 5-6 small meals, increase water intake to 6-8 glasses, and 150 minutes per week of cardiovascular exercise.   - HgB E9H - BASIC METABOLIC PANEL WITH GFR - Microalbumin/Creatinine Ratio, Urine  3. Mild asthma without complication, unspecified whether persistent - albuterol (PROVENTIL HFA;VENTOLIN HFA) 108 (90 Base) MCG/ACT inhaler; Inhale 1 puff into the lungs every 6 (six) hours as needed for wheezing or shortness of breath.  Dispense: 1 Inhaler; Refill: 1  4. Eczema, unspecified type - triamcinolone cream (KENALOG) 0.1 %; Apply 1 application topically 2 (two) times daily.  Dispense: 30 g; Refill: 3 - Ambulatory referral to Dermatology  5. Screening for HIV (human immunodeficiency virus) - HIV antibody (with reflex)  6. Severe obesity (BMI >= 40) (HCC) Recommend a lowfat, low carbohydrate diet divided over 5-6 small meals, increase water intake to 6-8 glasses, and 150 minutes per week of cardiovascular exercise.      [name]  MSN, FNP-C [facility] [address] [place], [place] [address] [phone]

## 2016-11-20 NOTE — Patient Instructions (Addendum)
DASH Eating Plan DASH stands for "Dietary Approaches to Stop Hypertension." The DASH eating plan is a healthy eating plan that has been shown to reduce high blood pressure (hypertension). It may also reduce your risk for type 2 diabetes, heart disease, and stroke. The DASH eating plan may also help with weight loss. What are tips for following this plan? General guidelines   Avoid eating more than 2,300 mg (milligrams) of salt (sodium) a day. If you have hypertension, you may need to reduce your sodium intake to 1,500 mg a day.  Limit alcohol intake to no more than 1 drink a day for nonpregnant women and 2 drinks a day for men. One drink equals 12 oz of beer, 5 oz of wine, or 1 oz of hard liquor.  Work with your health care provider to maintain a healthy body weight or to lose weight. Ask what an ideal weight is for you.  Get at least 30 minutes of exercise that causes your heart to beat faster (aerobic exercise) most days of the week. Activities may include walking, swimming, or biking.  Work with your health care provider or diet and nutrition specialist (dietitian) to adjust your eating plan to your individual calorie needs. Reading food labels   Check food labels for the amount of sodium per serving. Choose foods with less than 5 percent of the Daily Value of sodium. Generally, foods with less than 300 mg of sodium per serving fit into this eating plan.  To find whole grains, look for the word "whole" as the first word in the ingredient list. Shopping   Buy products labeled as "low-sodium" or "no salt added."  Buy fresh foods. Avoid canned foods and premade or frozen meals. Cooking   Avoid adding salt when cooking. Use salt-free seasonings or herbs instead of table salt or sea salt. Check with your health care provider or pharmacist before using salt substitutes.  Do not fry foods. Cook foods using healthy methods such as baking, boiling, grilling, and broiling instead.  Cook with  heart-healthy oils, such as olive, canola, soybean, or sunflower oil. Meal planning    Eat a balanced diet that includes:  5 or more servings of fruits and vegetables each day. At each meal, try to fill half of your plate with fruits and vegetables.  Up to 6-8 servings of whole grains each day.  Less than 6 oz of lean meat, poultry, or fish each day. A 3-oz serving of meat is about the same size as a deck of cards. One egg equals 1 oz.  2 servings of low-fat dairy each day.  A serving of nuts, seeds, or beans 5 times each week.  Heart-healthy fats. Healthy fats called Omega-3 fatty acids are found in foods such as flaxseeds and coldwater fish, like sardines, salmon, and mackerel.  Limit how much you eat of the following:  Canned or prepackaged foods.  Food that is high in trans fat, such as fried foods.  Food that is high in saturated fat, such as fatty meat.  Sweets, desserts, sugary drinks, and other foods with added sugar.  Full-fat dairy products.  Do not salt foods before eating.  Try to eat at least 2 vegetarian meals each week.  Eat more home-cooked food and less restaurant, buffet, and fast food.  When eating at a restaurant, ask that your food be prepared with less salt or no salt, if possible. What foods are recommended? The items listed may not be a complete list. Talk   with your dietitian about what dietary choices are best for you. Grains  Whole-grain or whole-wheat bread. Whole-grain or whole-wheat pasta. Brown rice. [place]. Bulgur. Whole-grain and low-sodium cereals. Pita bread. Low-fat, low-sodium crackers. Whole-wheat flour tortillas. Vegetables  Fresh or frozen vegetables (raw, steamed, roasted, or grilled). Low-sodium or reduced-sodium tomato and vegetable juice. Low-sodium or reduced-sodium tomato sauce and tomato paste. Low-sodium or reduced-sodium canned vegetables. Fruits  All fresh, dried, or frozen fruit. Canned fruit in natural juice  (without added sugar). Meat and other protein foods  Skinless chicken or [place]. Ground chicken or [place]. Pork with fat trimmed off. Fish and seafood. Egg whites. Dried beans, peas, or lentils. Unsalted nuts, nut butters, and seeds. Unsalted canned beans. Lean cuts of beef with fat trimmed off. Low-sodium, lean deli meat. Dairy  Low-fat (1%) or fat-free (skim) milk. Fat-free, low-fat, or reduced-fat cheeses. Nonfat, low-sodium ricotta or cottage cheese. Low-fat or nonfat yogurt. Low-fat, low-sodium cheese. Fats and oils  Soft margarine without trans fats. Vegetable oil. Low-fat, reduced-fat, or light mayonnaise and salad dressings (reduced-sodium). Canola, safflower, olive, soybean, and sunflower oils. Avocado. Seasoning and other foods  Herbs. Spices. Seasoning mixes without salt. Unsalted popcorn and pretzels. Fat-free sweets. What foods are not recommended? The items listed may not be a complete list. Talk with your dietitian about what dietary choices are best for you. Grains  Baked goods made with fat, such as croissants, muffins, or some breads. Dry pasta or rice meal packs. Vegetables  Creamed or fried vegetables. Vegetables in a cheese sauce. Regular canned vegetables (not low-sodium or reduced-sodium). Regular canned tomato sauce and paste (not low-sodium or reduced-sodium). Regular tomato and vegetable juice (not low-sodium or reduced-sodium). Angie Fava. Olives. Fruits  Canned fruit in a light or heavy syrup. Fried fruit. Fruit in cream or butter sauce. Meat and other protein foods  Fatty cuts of meat. Ribs. Fried meat. Berniece Salines. Sausage. Bologna and other processed lunch meats. Salami. Fatback. Hotdogs. Bratwurst. Salted nuts and seeds. Canned beans with added salt. Canned or smoked fish. Whole eggs or egg yolks. Chicken or [place] with skin. Dairy  Whole or 2% milk, cream, and half-and-half. Whole or full-fat cream cheese. Whole-fat or sweetened yogurt. Full-fat cheese. Nondairy creamers.  Whipped toppings. Processed cheese and cheese spreads. Fats and oils  Butter. Stick margarine. Lard. Shortening. Ghee. Bacon fat. Tropical oils, such as coconut, palm kernel, or palm oil. Seasoning and other foods  Salted popcorn and pretzels. Onion salt, garlic salt, seasoned salt, table salt, and sea salt. Worcestershire sauce. Tartar sauce. Barbecue sauce. Teriyaki sauce. Soy sauce, including reduced-sodium. Steak sauce. Canned and packaged gravies. Fish sauce. Oyster sauce. Cocktail sauce. Horseradish that you find on the shelf. Ketchup. Mustard. Meat flavorings and tenderizers. Bouillon cubes. Hot sauce and Tabasco sauce. Premade or packaged marinades. Premade or packaged taco seasonings. Relishes. Regular salad dressings. Where to find more information:  National Heart, Lung, and [place]: https://wilson-eaton.com/  American Heart Association: www.heart.org Summary  The DASH eating plan is a healthy eating plan that has been shown to reduce high blood pressure (hypertension). It may also reduce your risk for type 2 diabetes, heart disease, and stroke.  With the DASH eating plan, you should limit salt (sodium) intake to 2,300 mg a day. If you have hypertension, you may need to reduce your sodium intake to 1,500 mg a day.  When on the DASH eating plan, aim to eat more fresh fruits and vegetables, whole grains, lean proteins, low-fat dairy, and heart-healthy fats.  Work  with your health care provider or diet and nutrition specialist (dietitian) to adjust your eating plan to your individual calorie needs. This information is not intended to replace advice given to you by your health care provider. Make sure you discuss any questions you have with your health care provider. Document Released: 07/05/2011 Document Revised: 07/09/2016 Document Reviewed: 07/09/2016 Elsevier Interactive Patient Education  2017 Elsevier Inc.  Prediabetes Prediabetes is the condition of having a blood sugar (blood  glucose) level that is higher than it should be, but not high enough for you to be diagnosed with type 2 diabetes. Having prediabetes puts you at risk for developing type 2 diabetes (type 2 diabetes mellitus). Prediabetes may be called impaired glucose tolerance or impaired fasting glucose. Prediabetes usually does not cause symptoms. Your health care provider can diagnose this condition with blood tests. You may be tested for prediabetes if you are overweight and if you have at least one other risk factor for prediabetes. Risk factors for prediabetes include:  Having a family member with type 2 diabetes.  Being overweight or obese.  Being older than age 19.  Being of American-Indian, African-American, Hispanic/Latino, or Asian/Pacific Islander descent.  Having an inactive (sedentary) lifestyle.  Having a history of gestational diabetes or polycystic ovarian syndrome (PCOS).  Having low levels of good cholesterol (HDL-C) or high levels of blood fats (triglycerides).  Having high blood pressure. What is blood glucose and how is blood glucose measured?   Blood glucose refers to the amount of glucose in your bloodstream. Glucose comes from eating foods that contain sugars and starches (carbohydrates) that the body breaks down into glucose. Your blood glucose level may be measured in mg/dL (milligrams per deciliter) or mmol/L (millimoles per liter).Your blood glucose may be checked with one or more of the following blood tests:  A fasting blood glucose (FBG) test. You will not be allowed to eat (you will fast) for at least 8 hours before a blood sample is taken.  A normal range for FBG is 70-100 mg/dl (3.9-5.6 mmol/L).  An A1c (hemoglobin A1c) blood test. This test provides information about blood glucose control over the previous 2?43months.  An oral glucose tolerance test (OGTT). This test measures your blood glucose twice:  After fasting. This is your baseline level.  Two hours after  you drink a beverage that contains glucose. You may be diagnosed with prediabetes:  If your FBG is 100?125 mg/dL (5.6-6.9 mmol/L).  If your A1c level is 5.7?6.4%.  If your OGGT result is 140?199 mg/dL (7.8-11 mmol/L). These blood tests may be repeated to confirm your diagnosis. What happens if blood glucose is too high? The pancreas produces a hormone (insulin) that helps move glucose from the bloodstream into cells. When cells in the body do not respond properly to insulin that the body makes (insulin resistance), excess glucose builds up in the blood instead of going into cells. As a result, high blood glucose (hyperglycemia) can develop, which can cause many complications. This is a symptom of prediabetes. What can happen if blood glucose stays higher than normal for a long time? Having high blood glucose for a long time is dangerous. Too much glucose in your blood can damage your nerves and blood vessels. Long-term damage can lead to complications from diabetes, which may include:  Heart disease.  Stroke.  Blindness.  Kidney disease.  Depression.  Poor circulation in the feet and legs, which could lead to surgical removal (amputation) in severe cases. How can prediabetes be  prevented from turning into type 2 diabetes?   To help prevent type 2 diabetes, take the following actions:  Be physically active.  Do moderate-intensity physical activity for at least 30 minutes on at least 5 days of the week, or as much as told by your health care provider. This could be brisk walking, biking, or water aerobics.  Ask your health care provider what activities are safe for you. A mix of physical activities may be best, such as walking, swimming, cycling, and strength training.  Lose weight as told by your health care provider.  Losing 5-7% of your body weight can reverse insulin resistance.  Your health care provider can determine how much weight loss is best for you and can help you  lose weight safely.  Follow a healthy meal plan. This includes eating lean proteins, complex carbohydrates, fresh fruits and vegetables, low-fat dairy products, and healthy fats.  Follow instructions from your health care provider about eating or drinking restrictions.  Make an appointment to see a diet and nutrition specialist (registered dietitian) to help you create a healthy eating plan that is right for you.  Do not smoke or use any tobacco products, such as cigarettes, chewing tobacco, and e-cigarettes. If you need help quitting, ask your health care provider.  Take over-the-counter and prescription medicines as told by your health care provider. You may be prescribed medicines that help lower the risk of type 2 diabetes. This information is not intended to replace advice given to you by your health care provider. Make sure you discuss any questions you have with your health care provider. Document Released: 11/07/2015 Document Revised: 12/22/2015 Document Reviewed: 09/06/2015 Elsevier Interactive Patient Education  2017 [place] called impaired glucose tolerance or impaired fasting glucose-is a condition that causes blood sugar (blood glucose) levels to be higher than normal. Following a healthy diet can help to keep prediabetes under control. It can also help to lower the risk of type 2 diabetes and heart disease, which are increased in people who have prediabetes. Along with regular exercise, a healthy diet:  Promotes weight loss.  Helps to control blood sugar levels.  Helps to improve the way that the body uses insulin. What do I need to know about this eating plan?  Use the glycemic index (GI) to plan your meals. The index tells you how quickly a food will raise your blood sugar. Choose low-GI foods. These foods take a longer time to raise blood sugar.  Pay close attention to the amount of carbohydrates in the food that you eat.  Carbohydrates increase blood sugar levels.  Keep track of how many calories you take in. Eating the right amount of calories will help you to achieve a healthy weight. Losing about 7 percent of your starting weight can help to prevent type 2 diabetes.  You may want to follow a Mediterranean diet. This diet includes a lot of vegetables, lean meats or fish, whole grains, fruits, and healthy oils and fats. What foods can I eat? Grains  Whole grains, such as whole-wheat or whole-grain breads, crackers, cereals, and pasta. Unsweetened oatmeal. Bulgur. Barley. Quinoa. Brown rice. Corn or whole-wheat flour tortillas or taco shells. Vegetables  Lettuce. Spinach. Peas. Beets. Cauliflower. Cabbage. Broccoli. Carrots. Tomatoes. Squash. Eggplant. Herbs. Peppers. Onions. Cucumbers. Brussels sprouts. Fruits  Berries. Bananas. Apples. Oranges. Grapes. Papaya. Mango. Pomegranate. Kiwi. Grapefruit. Cherries. Meats and Other Protein Sources  Seafood. Lean meats, such as chicken and [place] or lean cuts of  pork and beef. Tofu. Eggs. Nuts. Beans. Dairy  Low-fat or fat-free dairy products, such as yogurt, cottage cheese, and cheese. Beverages  Water. Tea. Coffee. Sugar-free or diet soda. Seltzer water. Milk. Milk alternatives, such as soy or almond milk. Condiments  Mustard. Relish. Low-fat, low-sugar ketchup. Low-fat, low-sugar barbecue sauce. Low-fat or fat-free mayonnaise. Sweets and Desserts  Sugar-free or low-fat pudding. Sugar-free or low-fat ice cream and other frozen treats. Fats and Oils  Avocado. Walnuts. Olive oil. The items listed above may not be a complete list of recommended foods or beverages. Contact your dietitian for more options.  What foods are not recommended? Grains  Refined white flour and flour products, such as bread, pasta, snack foods, and cereals. Beverages  Sweetened drinks, such as sweet iced tea and soda. Sweets and Desserts  Baked goods, such as cake, cupcakes, pastries,  cookies, and cheesecake. The items listed above may not be a complete list of foods and beverages to avoid. Contact your dietitian for more information.  This information is not intended to replace advice given to you by your health care provider. Make sure you discuss any questions you have with your health care provider. Document Released: 11/30/2014 Document Revised: 12/22/2015 Document Reviewed: 08/11/2014 Elsevier Interactive Patient Education  2017 [employer].

## 2016-11-21 LAB — HIV ANTIBODY (ROUTINE TESTING W REFLEX): HIV 1&2 Ab, 4th Generation: NONREACTIVE

## 2016-11-21 LAB — MICROALBUMIN / CREATININE URINE RATIO
Creatinine, Urine: 485 mg/dL — ABNORMAL HIGH (ref 20–370)
MICROALB UR: 1 mg/dL
MICROALB/CREAT RATIO: 2 ug/mg{creat} (ref <30)

## 2016-11-29 ENCOUNTER — Telehealth: Payer: Self-pay

## 2016-11-29 NOTE — Telephone Encounter (Signed)
Called [facility] in [place] and they state he was scheduled for 12/21/2016 but has cancelled the appointment. I called and spoke with patient advised that we sent this referral [facility] because they are the only dermatology around that would accept his insurance. Patient states he will call back and re-schedule the appointment. Thanks!

## 2017-02-19 ENCOUNTER — Encounter: Payer: Self-pay | Admitting: Family Medicine

## 2017-02-19 ENCOUNTER — Ambulatory Visit (INDEPENDENT_AMBULATORY_CARE_PROVIDER_SITE_OTHER): Payer: Self-pay | Admitting: Family Medicine

## 2017-02-19 VITALS — BP 148/104 | HR 88 | Temp 98.1°F | Resp 18 | Ht 66.0 in | Wt 294.0 lb

## 2017-02-19 DIAGNOSIS — I10 Essential (primary) hypertension: Secondary | ICD-10-CM

## 2017-02-19 LAB — POCT URINALYSIS DIP (DEVICE)
Bilirubin Urine: NEGATIVE
GLUCOSE, UA: NEGATIVE mg/dL
Hgb urine dipstick: NEGATIVE
Leukocytes, UA: NEGATIVE
NITRITE: NEGATIVE
PH: 5.5 (ref 5.0–8.0)
Protein, ur: NEGATIVE mg/dL
Specific Gravity, Urine: >=1.03 (ref 1.005–1.030)
Urobilinogen, UA: 0.2 mg/dL (ref 0.0–1.0)

## 2017-02-19 MED ORDER — CLONIDINE HCL 0.1 MG PO TABS: 0.1000 mg | ORAL_TABLET | Freq: Three times a day (TID) | ORAL | 1 refills | Status: DC

## 2017-02-19 NOTE — Progress Notes (Signed)
Subjective:    Patient ID: Tudor Chandley, male    DOB: Sep 17, 1975, 41 y.o.   MRN: [id number]  HPI  Mr. Jian Hodgman, a 41 year old male with a history of hypertension presents for a follow up. He says that he has been taking medications consistently. He does not exercise routinely or follow a lowfat, low sodium diet. He denies blurred vision, dizziness, chest pain, heart palpitation, shortness of breath or lower extremity edema. Briggs's cardiovascular risk factors include morbid obesity and sedentary lifestyle.   Past Medical History:  Diagnosis Date  . Asthma   . Hypertension    Immunization History  Administered Date(s) Administered  . Influenza,inj,Quad PF,36+ Mos 08/22/2016  . Tdap 08/22/2016  No Known Allergies  Social History   Social History  . Marital status: Single    Spouse name: N/A  . Number of children: N/A  . Years of education: N/A   Occupational History  . Not on file.   Social History Main Topics  . Smoking status: Never Smoker  . Smokeless tobacco: Never Used  . Alcohol use No  . Drug use: No  . Sexual activity: No   Other Topics Concern  . Not on file   Social History Narrative  . No narrative on file    Review of Systems  Constitutional: Negative.  Negative for appetite change and malaise/fatigue.  HENT: Negative.  Negative for hoarse voice and sneezing.   Eyes: Negative.  Negative for blurred vision.  Respiratory: Negative.  Negative for apnea and sputum production.   Cardiovascular: Negative.  Negative for chest pain, dyspnea on exertion, orthopnea, leg swelling and PND.  Gastrointestinal: Negative.   Endocrine: Negative.  Negative for polydipsia, polyphagia and polyuria.  Genitourinary: Negative.   Musculoskeletal: Negative.  Negative for neck pain.  Skin:       History of eczema; primarily to face  Allergic/Immunologic: Negative.  Negative for immunocompromised state.  Neurological: Negative.   Hematological: Negative.    Psychiatric/Behavioral: Negative.        Objective:   Physical Exam  Constitutional: He is oriented to person, place, and time. He appears well-developed and well-nourished.  Eyes: Pupils are equal, round, and reactive to light.  Cardiovascular: Normal rate, regular rhythm, normal heart sounds and intact distal pulses.   Pulmonary/Chest: Effort normal and breath sounds normal.  Abdominal: Soft. Bowel sounds are normal.  Increased abdominal girth  Musculoskeletal: Normal range of motion.  Neurological: He is alert and oriented to person, place, and time. He has normal reflexes.  Skin: Skin is warm and dry.  Psychiatric: He has a normal mood and affect. His behavior is normal. Judgment and thought content normal.       BP (!) 156/102 (BP Location: Right Arm, Patient Position: Sitting, Cuff Size: Large) Comment: manual  Pulse 88   Temp 98.1 F (36.7 C) (Oral)   Resp 18   Ht 5\' 6"  (1.676 m)   Wt 294 lb (133.4 kg)   SpO2 96%   BMI 47.45 kg/m  Assessment & Plan:   Essential hypertension Blood pressure is above goal on current medication regimen.  Repeated urinalysis, no proteinuria present.  Mr. Vandenberghe is taking all medications consistently and blood pressure is not controlled. Will increased Clonidine to 0.1 mg TID. Will continue Lisinopril-hydrochlorothiazide and metoprolol and previously prescribed.  Recommend a lowfat, low carbohydrate diet divided over 5-6 small meals, increase water intake to 6-8 glasses, and 150 minutes per week of cardiovascular exercise.   Continue medication,  monitor blood pressure at home. Continue DASH diet. Reminder to go to the ER if any CP, SOB, nausea, dizziness, severe HA, changes vision/speech, left arm numbness and tingling and jaw pain. - cloNIDine (CATAPRES) 0.1 MG tablet; Take 1 tablet (0.1 mg total) by mouth 3 (three) times daily.  Dispense: 180 tablet; Refill: 1   RTC: 1 week for bp check and 1 month for hypertension   [name]   MSN, FNP-C [facility] [address] [place], [place] [address] [phone]

## 2017-02-19 NOTE — Patient Instructions (Addendum)
Blood pressure is above goal, will increase Clonidine 0.1 mg to three times per day for greater blood pressure control.   Resume all other home medications  Will follow up in 1 month for blood pressure check   DASH Eating Plan DASH stands for "Dietary Approaches to Stop Hypertension." The DASH eating plan is a healthy eating plan that has been shown to reduce high blood pressure (hypertension). It may also reduce your risk for type 2 diabetes, heart disease, and stroke. The DASH eating plan may also help with weight loss. What are tips for following this plan? General guidelines  Avoid eating more than 2,300 mg (milligrams) of salt (sodium) a day. If you have hypertension, you may need to reduce your sodium intake to 1,500 mg a day.  Limit alcohol intake to no more than 1 drink a day for nonpregnant women and 2 drinks a day for men. One drink equals 12 oz of beer, 5 oz of wine, or 1 oz of hard liquor.  Work with your health care provider to maintain a healthy body weight or to lose weight. Ask what an ideal weight is for you.  Get at least 30 minutes of exercise that causes your heart to beat faster (aerobic exercise) most days of the week. Activities may include walking, swimming, or biking.  Work with your health care provider or diet and nutrition specialist (dietitian) to adjust your eating plan to your individual calorie needs. Reading food labels  Check food labels for the amount of sodium per serving. Choose foods with less than 5 percent of the Daily Value of sodium. Generally, foods with less than 300 mg of sodium per serving fit into this eating plan.  To find whole grains, look for the word "whole" as the first word in the ingredient list. Shopping  Buy products labeled as "low-sodium" or "no salt added."  Buy fresh foods. Avoid canned foods and premade or frozen meals. Cooking  Avoid adding salt when cooking. Use salt-free seasonings or herbs instead of table salt or sea  salt. Check with your health care provider or pharmacist before using salt substitutes.  Do not fry foods. Cook foods using healthy methods such as baking, boiling, grilling, and broiling instead.  Cook with heart-healthy oils, such as olive, canola, soybean, or sunflower oil. Meal planning   Eat a balanced diet that includes: ? 5 or more servings of fruits and vegetables each day. At each meal, try to fill half of your plate with fruits and vegetables. ? Up to 6-8 servings of whole grains each day. ? Less than 6 oz of lean meat, poultry, or fish each day. A 3-oz serving of meat is about the same size as a deck of cards. One egg equals 1 oz. ? 2 servings of low-fat dairy each day. ? A serving of nuts, seeds, or beans 5 times each week. ? Heart-healthy fats. Healthy fats called Omega-3 fatty acids are found in foods such as flaxseeds and coldwater fish, like sardines, salmon, and mackerel.  Limit how much you eat of the following: ? Canned or prepackaged foods. ? Food that is high in trans fat, such as fried foods. ? Food that is high in saturated fat, such as fatty meat. ? Sweets, desserts, sugary drinks, and other foods with added sugar. ? Full-fat dairy products.  Do not salt foods before eating.  Try to eat at least 2 vegetarian meals each week.  Eat more home-cooked food and less restaurant, buffet, and fast  food.  When eating at a restaurant, ask that your food be prepared with less salt or no salt, if possible. What foods are recommended? The items listed may not be a complete list. Talk with your dietitian about what dietary choices are best for you. Grains Whole-grain or whole-wheat bread. Whole-grain or whole-wheat pasta. Brown rice. [place]. Bulgur. Whole-grain and low-sodium cereals. Pita bread. Low-fat, low-sodium crackers. Whole-wheat flour tortillas. Vegetables Fresh or frozen vegetables (raw, steamed, roasted, or grilled). Low-sodium or reduced-sodium tomato  and vegetable juice. Low-sodium or reduced-sodium tomato sauce and tomato paste. Low-sodium or reduced-sodium canned vegetables. Fruits All fresh, dried, or frozen fruit. Canned fruit in natural juice (without added sugar). Meat and other protein foods Skinless chicken or [place]. Ground chicken or [place]. Pork with fat trimmed off. Fish and seafood. Egg whites. Dried beans, peas, or lentils. Unsalted nuts, nut butters, and seeds. Unsalted canned beans. Lean cuts of beef with fat trimmed off. Low-sodium, lean deli meat. Dairy Low-fat (1%) or fat-free (skim) milk. Fat-free, low-fat, or reduced-fat cheeses. Nonfat, low-sodium ricotta or cottage cheese. Low-fat or nonfat yogurt. Low-fat, low-sodium cheese. Fats and oils Soft margarine without trans fats. Vegetable oil. Low-fat, reduced-fat, or light mayonnaise and salad dressings (reduced-sodium). Canola, safflower, olive, soybean, and sunflower oils. Avocado. Seasoning and other foods Herbs. Spices. Seasoning mixes without salt. Unsalted popcorn and pretzels. Fat-free sweets. What foods are not recommended? The items listed may not be a complete list. Talk with your dietitian about what dietary choices are best for you. Grains Baked goods made with fat, such as croissants, muffins, or some breads. Dry pasta or rice meal packs. Vegetables Creamed or fried vegetables. Vegetables in a cheese sauce. Regular canned vegetables (not low-sodium or reduced-sodium). Regular canned tomato sauce and paste (not low-sodium or reduced-sodium). Regular tomato and vegetable juice (not low-sodium or reduced-sodium). Angie Fava. Olives. Fruits Canned fruit in a light or heavy syrup. Fried fruit. Fruit in cream or butter sauce. Meat and other protein foods Fatty cuts of meat. Ribs. Fried meat. Berniece Salines. Sausage. Bologna and other processed lunch meats. Salami. Fatback. Hotdogs. Bratwurst. Salted nuts and seeds. Canned beans with added salt. Canned or smoked fish. Whole eggs  or egg yolks. Chicken or [place] with skin. Dairy Whole or 2% milk, cream, and half-and-half. Whole or full-fat cream cheese. Whole-fat or sweetened yogurt. Full-fat cheese. Nondairy creamers. Whipped toppings. Processed cheese and cheese spreads. Fats and oils Butter. Stick margarine. Lard. Shortening. Ghee. Bacon fat. Tropical oils, such as coconut, palm kernel, or palm oil. Seasoning and other foods Salted popcorn and pretzels. Onion salt, garlic salt, seasoned salt, table salt, and sea salt. Worcestershire sauce. Tartar sauce. Barbecue sauce. Teriyaki sauce. Soy sauce, including reduced-sodium. Steak sauce. Canned and packaged gravies. Fish sauce. Oyster sauce. Cocktail sauce. Horseradish that you find on the shelf. Ketchup. Mustard. Meat flavorings and tenderizers. Bouillon cubes. Hot sauce and Tabasco sauce. Premade or packaged marinades. Premade or packaged taco seasonings. Relishes. Regular salad dressings. Where to find more information:  National Heart, Lung, and [place]: https://wilson-eaton.com/  American Heart Association: www.heart.org Summary  The DASH eating plan is a healthy eating plan that has been shown to reduce high blood pressure (hypertension). It may also reduce your risk for type 2 diabetes, heart disease, and stroke.  With the DASH eating plan, you should limit salt (sodium) intake to 2,300 mg a day. If you have hypertension, you may need to reduce your sodium intake to 1,500 mg a day.  When on the DASH  eating plan, aim to eat more fresh fruits and vegetables, whole grains, lean proteins, low-fat dairy, and heart-healthy fats.  Work with your health care provider or diet and nutrition specialist (dietitian) to adjust your eating plan to your individual calorie needs. This information is not intended to replace advice given to you by your health care provider. Make sure you discuss any questions you have with your health care provider. Document Released: 07/05/2011  Document Revised: 07/09/2016 Document Reviewed: 07/09/2016 Elsevier Interactive Patient Education  2017 [employer].

## 2017-03-22 ENCOUNTER — Ambulatory Visit: Payer: Self-pay

## 2017-03-22 VITALS — BP 118/80

## 2017-03-22 DIAGNOSIS — I10 Essential (primary) hypertension: Secondary | ICD-10-CM

## 2017-05-22 ENCOUNTER — Ambulatory Visit (INDEPENDENT_AMBULATORY_CARE_PROVIDER_SITE_OTHER): Payer: Self-pay | Admitting: Family Medicine

## 2017-05-22 ENCOUNTER — Encounter: Payer: Self-pay | Admitting: Family Medicine

## 2017-05-22 VITALS — BP 162/98 | HR 81 | Temp 98.5°F | Resp 16 | Ht 66.0 in | Wt 301.0 lb

## 2017-05-22 DIAGNOSIS — Z23 Encounter for immunization: Secondary | ICD-10-CM

## 2017-05-22 DIAGNOSIS — I10 Essential (primary) hypertension: Secondary | ICD-10-CM

## 2017-05-22 DIAGNOSIS — R9431 Abnormal electrocardiogram [ECG] [EKG]: Secondary | ICD-10-CM

## 2017-05-22 DIAGNOSIS — Z6841 Body Mass Index (BMI) 40.0 and over, adult: Secondary | ICD-10-CM

## 2017-05-22 LAB — COMPLETE METABOLIC PANEL WITH GFR
AG Ratio: 1.4 (calc) (ref 1.0–2.5)
ALKALINE PHOSPHATASE (APISO): 62 U/L (ref 40–115)
ALT: 18 U/L (ref 9–46)
AST: 19 U/L (ref 10–40)
Albumin: 4.2 g/dL (ref 3.6–5.1)
BUN: 14 mg/dL (ref 7–25)
CO2: 28 mmol/L (ref 20–32)
Calcium: 9.6 mg/dL (ref 8.6–10.3)
Chloride: 99 mmol/L (ref 98–110)
Creat: 1.22 mg/dL (ref 0.60–1.35)
GFR, EST NON AFRICAN AMERICAN: 75 mL/min/{1.73_m2} (ref >=60)
GFR, Est African American: 87 mL/min/{1.73_m2} (ref >=60)
GLOBULIN: 3 g/dL (ref 1.9–3.7)
GLUCOSE: 98 mg/dL (ref 65–99)
Potassium: 3.8 mmol/L (ref 3.5–5.3)
Sodium: 138 mmol/L (ref 135–146)
TOTAL PROTEIN: 7.2 g/dL (ref 6.1–8.1)
Total Bilirubin: 0.8 mg/dL (ref 0.2–1.2)

## 2017-05-22 LAB — POCT URINALYSIS DIP (DEVICE)
GLUCOSE, UA: NEGATIVE mg/dL
HGB URINE DIPSTICK: NEGATIVE
Leukocytes, UA: NEGATIVE
Nitrite: NEGATIVE
Protein, ur: 30 mg/dL — AB
Specific Gravity, Urine: >=1.03 (ref 1.005–1.030)
Urobilinogen, UA: 1 mg/dL (ref 0.0–1.0)
pH: 5.5 (ref 5.0–8.0)

## 2017-05-22 MED ORDER — CLONIDINE HCL 0.1 MG PO TABS
0.1000 mg | ORAL_TABLET | Freq: Once | ORAL | Status: AC
  Administered 2017-05-22: 0.1 mg via ORAL

## 2017-05-22 NOTE — Progress Notes (Signed)
Subjective:    Patient ID: Edward Carr, male    DOB: 27-Jan-1976, 41 y.o.   MRN: [id number]  HPI Edward Carr, a 41 year old male with a history of hypertension presents for a follow up of hypertension. Patient is not exercising routinely or following a lowfat, low cholesterol diet. He does not check blood pressures at home. Body mass index is 48.58 kg/m. He endorses periodic fatigue.Patient denies chest pain, dyspnea, irregular heart beat, lower extremity edema, palpitations, syncope and tachypnea.  Cardiovascular risk factors include: hypertension, obesity (BMI >= 30 kg/m2) and sedentary lifestyle.   Past Medical History:  Diagnosis Date  . Asthma   . Hypertension    Social History   Social History  . Marital status: Single    Spouse name: N/A  . Number of children: N/A  . Years of education: N/A   Occupational History  . Not on file.   Social History Main Topics  . Smoking status: Never Smoker  . Smokeless tobacco: Never Used  . Alcohol use No  . Drug use: No  . Sexual activity: No   Other Topics Concern  . Not on file   Social History Narrative  . No narrative on file   Immunization History  Administered Date(s) Administered  . Influenza,inj,Quad PF,6+ Mos 08/22/2016  . Tdap 08/22/2016    Review of Systems  Constitutional: Positive for unexpected weight change. Negative for diaphoresis, fatigue and fever.  HENT: Negative.   Eyes: Negative.  Negative for photophobia and visual disturbance.  Respiratory: Negative.   Cardiovascular: Negative.  Negative for leg swelling.  Gastrointestinal: Negative.   Endocrine: Negative for polydipsia, polyphagia and polyuria.  Genitourinary: Negative.   Musculoskeletal: Negative.   Skin: Negative.   Allergic/Immunologic: Negative for immunocompromised state.  Neurological: Negative.   Hematological: Negative.   Psychiatric/Behavioral: Negative.        Objective:   Physical Exam  Constitutional: He is oriented to  person, place, and time.  HENT:  Head: Normocephalic and atraumatic.  Right Ear: External ear normal.  Left Ear: External ear normal.  Nose: Nose normal.  Mouth/Throat: Oropharynx is clear and moist.  Eyes: Pupils are equal, round, and reactive to light. Conjunctivae and EOM are normal.  Neck: Normal range of motion. Neck supple.  Cardiovascular: Normal rate, regular rhythm, normal heart sounds and intact distal pulses.   Pulmonary/Chest: Effort normal and breath sounds normal.  Abdominal: Soft. Bowel sounds are normal.  Neurological: He is alert and oriented to person, place, and time. He has normal reflexes.  Skin: Skin is warm and dry.  Psychiatric: He has a normal mood and affect. His behavior is normal. Judgment and thought content normal.       BP (!) 162/98 Comment: manually  Pulse 81   Temp 98.5 F (36.9 C) (Oral)   Resp 16   Ht 5\' 6"  (1.676 m)   Wt (!) 301 lb (136.5 kg)   SpO2 96%   BMI 48.58 kg/m  Assessment & Plan:  1. Essential hypertension Clonidone 0.1 mg administered. Diastolic bp decreased. Discussed the importance of taking medication consistently.  - Continue medication, monitor blood pressure at home. Continue DASH diet. Reminder to go to the ER if any CP, SOB, nausea, dizziness, severe HA, changes vision/speech, left arm numbness and tingling and jaw pain.  Proteinuria, will continue ACE inhibitor.   - COMPLETE METABOLIC PANEL WITH GFR - EKG 12-Lead - cloNIDine (CATAPRES) tablet 0.1 mg; Take 1 tablet (0.1 mg total) by mouth once.  2. Morbid obesity with BMI of 45.0-49.9, adult (HCC) Goal to decrease weight by 10 pounds by 3 month follow up.  Discussed diet and exercise regimen at length Recommend a lowfat, low carbohydrate diet divided over 5-6 small meals, increase water intake to 6-8 glasses, and 150 minutes per week of cardiovascular exercise.   3. Abnormal EKG Reviewed EKG, consistent with previous EKG.   4. Influenza vaccination given - Flu  Vaccine QUAD 6+ mos PF IM (Fluarix Quad PF)   RTC: 3 months for hypertension and morbid obesity.  1 week for bp check   [name]  MSN, FNP-C [facility] [address] [employer] [place] [place], [place] [id number] [phone]

## 2017-05-22 NOTE — Patient Instructions (Addendum)
- Continue medication, monitor blood pressure at home. Continue DASH diet. Reminder to go to the ER if any CP, SOB, nausea, dizziness, severe HA, changes vision/speech, left arm numbness and tingling and jaw pain.  Start an 1800 calorie low fat, low carbohydrate diet.  Exercise 150 minutes per week.   Goal is a 10 pound weight loss in 3 months.           DASH Eating Plan DASH stands for "Dietary Approaches to Stop Hypertension." The DASH eating plan is a healthy eating plan that has been shown to reduce high blood pressure (hypertension). It may also reduce your risk for type 2 diabetes, heart disease, and stroke. The DASH eating plan may also help with weight loss. What are tips for following this plan? General guidelines  Avoid eating more than 2,300 mg (milligrams) of salt (sodium) a day. If you have hypertension, you may need to reduce your sodium intake to 1,500 mg a day.  Limit alcohol intake to no more than 1 drink a day for nonpregnant women and 2 drinks a day for men. One drink equals 12 oz of beer, 5 oz of wine, or 1 oz of hard liquor.  Work with your health care provider to maintain a healthy body weight or to lose weight. Ask what an ideal weight is for you.  Get at least 30 minutes of exercise that causes your heart to beat faster (aerobic exercise) most days of the week. Activities may include walking, swimming, or biking.  Work with your health care provider or diet and nutrition specialist (dietitian) to adjust your eating plan to your individual calorie needs. Reading food labels  Check food labels for the amount of sodium per serving. Choose foods with less than 5 percent of the Daily Value of sodium. Generally, foods with less than 300 mg of sodium per serving fit into this eating plan.  To find whole grains, look for the word "whole" as the first word in the ingredient list. Shopping  Buy products labeled as "low-sodium" or "no salt added."  Buy fresh  foods. Avoid canned foods and premade or frozen meals. Cooking  Avoid adding salt when cooking. Use salt-free seasonings or herbs instead of table salt or sea salt. Check with your health care provider or pharmacist before using salt substitutes.  Do not fry foods. Cook foods using healthy methods such as baking, boiling, grilling, and broiling instead.  Cook with heart-healthy oils, such as olive, canola, soybean, or sunflower oil. Meal planning   Eat a balanced diet that includes: ? 5 or more servings of fruits and vegetables each day. At each meal, try to fill half of your plate with fruits and vegetables. ? Up to 6-8 servings of whole grains each day. ? Less than 6 oz of lean meat, poultry, or fish each day. A 3-oz serving of meat is about the same size as a deck of cards. One egg equals 1 oz. ? 2 servings of low-fat dairy each day. ? A serving of nuts, seeds, or beans 5 times each week. ? Heart-healthy fats. Healthy fats called Omega-3 fatty acids are found in foods such as flaxseeds and coldwater fish, like sardines, salmon, and mackerel.  Limit how much you eat of the following: ? Canned or prepackaged foods. ? Food that is high in trans fat, such as fried foods. ? Food that is high in saturated fat, such as fatty meat. ? Sweets, desserts, sugary drinks, and other foods with added sugar. ?  Full-fat dairy products.  Do not salt foods before eating.  Try to eat at least 2 vegetarian meals each week.  Eat more home-cooked food and less restaurant, buffet, and fast food.  When eating at a restaurant, ask that your food be prepared with less salt or no salt, if possible. What foods are recommended? The items listed may not be a complete list. Talk with your dietitian about what dietary choices are best for you. Grains Whole-grain or whole-wheat bread. Whole-grain or whole-wheat pasta. Brown rice. [place]row. Bulgur. Whole-grain and low-sodium cereals. Pita bread. Low-fat,  low-sodium crackers. Whole-wheat flour tortillas. Vegetables Fresh or frozen vegetables (raw, steamed, roasted, or grilled). Low-sodium or reduced-sodium tomato and vegetable juice. Low-sodium or reduced-sodium tomato sauce and tomato paste. Low-sodium or reduced-sodium canned vegetables. Fruits All fresh, dried, or frozen fruit. Canned fruit in natural juice (without added sugar). Meat and other protein foods Skinless chicken or [place]. Ground chicken or [place]. Pork with fat trimmed off. Fish and seafood. Egg whites. Dried beans, peas, or lentils. Unsalted nuts, nut butters, and seeds. Unsalted canned beans. Lean cuts of beef with fat trimmed off. Low-sodium, lean deli meat. Dairy Low-fat (1%) or fat-free (skim) milk. Fat-free, low-fat, or reduced-fat cheeses. Nonfat, low-sodium ricotta or cottage cheese. Low-fat or nonfat yogurt. Low-fat, low-sodium cheese. Fats and oils Soft margarine without trans fats. Vegetable oil. Low-fat, reduced-fat, or light mayonnaise and salad dressings (reduced-sodium). Canola, safflower, olive, soybean, and sunflower oils. Avocado. Seasoning and other foods Herbs. Spices. Seasoning mixes without salt. Unsalted popcorn and pretzels. Fat-free sweets. What foods are not recommended? The items listed may not be a complete list. Talk with your dietitian about what dietary choices are best for you. Grains Baked goods made with fat, such as croissants, muffins, or some breads. Dry pasta or rice meal packs. Vegetables Creamed or fried vegetables. Vegetables in a cheese sauce. Regular canned vegetables (not low-sodium or reduced-sodium). Regular canned tomato sauce and paste (not low-sodium or reduced-sodium). Regular tomato and vegetable juice (not low-sodium or reduced-sodium). Angie Fava. Olives. Fruits Canned fruit in a light or heavy syrup. Fried fruit. Fruit in cream or butter sauce. Meat and other protein foods Fatty cuts of meat. Ribs. Fried meat. Berniece Salines. Sausage.  Bologna and other processed lunch meats. Salami. Fatback. Hotdogs. Bratwurst. Salted nuts and seeds. Canned beans with added salt. Canned or smoked fish. Whole eggs or egg yolks. Chicken or [place] with skin. Dairy Whole or 2% milk, cream, and half-and-half. Whole or full-fat cream cheese. Whole-fat or sweetened yogurt. Full-fat cheese. Nondairy creamers. Whipped toppings. Processed cheese and cheese spreads. Fats and oils Butter. Stick margarine. Lard. Shortening. Ghee. Bacon fat. Tropical oils, such as coconut, palm kernel, or palm oil. Seasoning and other foods Salted popcorn and pretzels. Onion salt, garlic salt, seasoned salt, table salt, and sea salt. Worcestershire sauce. Tartar sauce. Barbecue sauce. Teriyaki sauce. Soy sauce, including reduced-sodium. Steak sauce. Canned and packaged gravies. Fish sauce. Oyster sauce. Cocktail sauce. Horseradish that you find on the shelf. Ketchup. Mustard. Meat flavorings and tenderizers. Bouillon cubes. Hot sauce and Tabasco sauce. Premade or packaged marinades. Premade or packaged taco seasonings. Relishes. Regular salad dressings. Where to find more information:  National Heart, Lung, and [place]: https://wilson-eaton.com/  American Heart Association: www.heart.org Summary  The DASH eating plan is a healthy eating plan that has been shown to reduce high blood pressure (hypertension). It may also reduce your risk for type 2 diabetes, heart disease, and stroke.  With the DASH eating plan, you  should limit salt (sodium) intake to 2,300 mg a day. If you have hypertension, you may need to reduce your sodium intake to 1,500 mg a day.  When on the DASH eating plan, aim to eat more fresh fruits and vegetables, whole grains, lean proteins, low-fat dairy, and heart-healthy fats.  Work with your health care provider or diet and nutrition specialist (dietitian) to adjust your eating plan to your individual calorie needs. This information is not intended to  replace advice given to you by your health care provider. Make sure you discuss any questions you have with your health care provider. Document Released: 07/05/2011 Document Revised: 07/09/2016 Document Reviewed: 07/09/2016 Elsevier Interactive Patient Education  2017 [employer].

## 2017-05-29 ENCOUNTER — Ambulatory Visit: Payer: Self-pay

## 2017-05-29 VITALS — BP 136/78

## 2017-05-29 DIAGNOSIS — I10 Essential (primary) hypertension: Secondary | ICD-10-CM

## 2017-08-22 ENCOUNTER — Ambulatory Visit (INDEPENDENT_AMBULATORY_CARE_PROVIDER_SITE_OTHER): Payer: Self-pay | Admitting: Family Medicine

## 2017-08-22 ENCOUNTER — Encounter: Payer: Self-pay | Admitting: Family Medicine

## 2017-08-22 VITALS — BP 135/84 | HR 81 | Temp 98.3°F | Resp 16 | Ht 66.0 in | Wt 308.0 lb

## 2017-08-22 DIAGNOSIS — R7303 Prediabetes: Secondary | ICD-10-CM

## 2017-08-22 DIAGNOSIS — I10 Essential (primary) hypertension: Secondary | ICD-10-CM

## 2017-08-22 DIAGNOSIS — Z6841 Body Mass Index (BMI) 40.0 and over, adult: Secondary | ICD-10-CM

## 2017-08-22 LAB — POCT URINALYSIS DIP (DEVICE)
Glucose, UA: NEGATIVE mg/dL
Leukocytes, UA: NEGATIVE
Nitrite: NEGATIVE
Protein, ur: NEGATIVE mg/dL
UROBILINOGEN UA: 1 mg/dL (ref 0.0–1.0)
pH: 5.5 (ref 5.0–8.0)

## 2017-08-22 LAB — POCT GLYCOSYLATED HEMOGLOBIN (HGB A1C): HEMOGLOBIN A1C: 6.1

## 2017-08-22 NOTE — Patient Instructions (Addendum)
Blood pressure is at goal on current medication regimen.  No medication changes warranted on today. We have discussed target BP range and blood pressure goal. I have advised patient to check BP regularly and to call us back or report to clinic if the numbers are consistently higher than 140/90. We discussed the importance of compliance with medical therapy and DASH diet recommended, consequences of uncontrolled hypertension discussed.      Prediabetes Eating Plan Prediabetes-also called impaired glucose tolerance or impaired fasting glucose-is a condition that causes blood sugar (blood glucose) levels to be higher than normal. Following a healthy diet can help to keep prediabetes under control. It can also help to lower the risk of type 2 diabetes and heart disease, which are increased in people who have prediabetes. Along with regular exercise, a healthy diet:  Promotes weight loss.  Helps to control blood sugar levels.  Helps to improve the way that the body uses insulin.  What do I need to know about this eating plan?  Use the glycemic index (GI) to plan your meals. The index tells you how quickly a food will raise your blood sugar. Choose low-GI foods. These foods take a longer time to raise blood sugar.  Pay close attention to the amount of carbohydrates in the food that you eat. Carbohydrates increase blood sugar levels.  Keep track of how many calories you take in. Eating the right amount of calories will help you to achieve a healthy weight. Losing about 7 percent of your starting weight can help to prevent type 2 diabetes.  You may want to follow a Mediterranean diet. This diet includes a lot of vegetables, lean meats or fish, whole grains, fruits, and healthy oils and fats. What foods can I eat? Grains Whole grains, such as whole-wheat or whole-grain breads, crackers, cereals, and pasta. Unsweetened oatmeal. Bulgur. Barley. Quinoa. Brown rice. Corn or whole-wheat flour tortillas or  taco shells. Vegetables Lettuce. Spinach. Peas. Beets. Cauliflower. Cabbage. Broccoli. Carrots. Tomatoes. Squash. Eggplant. Herbs. Peppers. Onions. Cucumbers. Brussels sprouts. Fruits Berries. Bananas. Apples. Oranges. Grapes. Papaya. Mango. Pomegranate. Kiwi. Grapefruit. Cherries. Meats and Other Protein Sources Seafood. Lean meats, such as chicken and [place] or lean cuts of pork and beef. Tofu. Eggs. Nuts. Beans. Dairy Low-fat or fat-free dairy products, such as yogurt, cottage cheese, and cheese. Beverages Water. Tea. Coffee. Sugar-free or diet soda. Seltzer water. Milk. Milk alternatives, such as soy or almond milk. Condiments Mustard. Relish. Low-fat, low-sugar ketchup. Low-fat, low-sugar barbecue sauce. Low-fat or fat-free mayonnaise. Sweets and Desserts Sugar-free or low-fat pudding. Sugar-free or low-fat ice cream and other frozen treats. Fats and Oils Avocado. Walnuts. Olive oil. The items listed above may not be a complete list of recommended foods or beverages. Contact your dietitian for more options. What foods are not recommended? Grains Refined white flour and flour products, such as bread, pasta, snack foods, and cereals. Beverages Sweetened drinks, such as sweet iced tea and soda. Sweets and Desserts Baked goods, such as cake, cupcakes, pastries, cookies, and cheesecake. The items listed above may not be a complete list of foods and beverages to avoid. Contact your dietitian for more information. This information is not intended to replace advice given to you by your health care provider. Make sure you discuss any questions you have with your health care provider. Document Released: 11/30/2014 Document Revised: 12/22/2015 Document Reviewed: 08/11/2014 Elsevier Interactive Patient Education  2017 [employer].

## 2017-08-22 NOTE — Progress Notes (Signed)
Subjective:    Patient ID: Edward Carr, male    DOB: September 17, 1975, 42 y.o.   MRN: [id number]  HPI Edward Carr, a 42 year old male with a history of hypertension and prediabetes presents for a 3 month follow up. Patient is not exercising routinely or following a lowfat, low cholesterol diet. He does not check blood pressures at home. Body mass index is 49.71 kg/m. He endorses periodic fatigue.Patient denies chest pain, dyspnea, irregular heart beat, lower extremity edema, palpitations, syncope and tachypnea.  Cardiovascular risk factors include: hypertension, obesity (BMI >= 30 kg/m2) and sedentary lifestyle.   Past Medical History:  Diagnosis Date  . Asthma   . Hypertension    Social History   Socioeconomic History  . Marital status: Single    Spouse name: Not on file  . Number of children: Not on file  . Years of education: Not on file  . Highest education level: Not on file  Social Needs  . Financial resource strain: Not on file  . Food insecurity - worry: Not on file  . Food insecurity - inability: Not on file  . Transportation needs - medical: Not on file  . Transportation needs - non-medical: Not on file  Occupational History  . Not on file  Tobacco Use  . Smoking status: Never Smoker  . Smokeless tobacco: Never Used  Substance and Sexual Activity  . Alcohol use: No  . Drug use: No  . Sexual activity: No  Other Topics Concern  . Not on file  Social History Narrative  . Not on file   Immunization History  Administered Date(s) Administered  . Influenza,inj,Quad PF,6+ Mos 08/22/2016, 05/22/2017  . Tdap 08/22/2016    Review of Systems  Constitutional: Positive for unexpected weight change. Negative for diaphoresis, fatigue and fever.  HENT: Negative.   Eyes: Negative.  Negative for photophobia and visual disturbance.  Respiratory: Negative.   Cardiovascular: Negative.  Negative for leg swelling.  Gastrointestinal: Negative.   Endocrine: Negative for  polydipsia, polyphagia and polyuria.  Genitourinary: Negative.   Musculoskeletal: Negative.   Skin: Negative.   Allergic/Immunologic: Negative for immunocompromised state.  Neurological: Negative.   Hematological: Negative.   Psychiatric/Behavioral: Negative.        Objective:   Physical Exam  Constitutional: He is oriented to person, place, and time.  HENT:  Head: Normocephalic and atraumatic.  Right Ear: External ear normal.  Left Ear: External ear normal.  Nose: Nose normal.  Mouth/Throat: Oropharynx is clear and moist.  Eyes: Conjunctivae and EOM are normal. Pupils are equal, round, and reactive to light.  Neck: Normal range of motion. Neck supple.  Cardiovascular: Normal rate, regular rhythm, normal heart sounds and intact distal pulses.  Pulmonary/Chest: Effort normal and breath sounds normal.  Abdominal: Soft. Bowel sounds are normal.  Neurological: He is alert and oriented to person, place, and time. He has normal reflexes.  Skin: Skin is warm and dry.  Psychiatric: He has a normal mood and affect. His behavior is normal. Judgment and thought content normal.       BP 135/84 (BP Location: Left Arm, Patient Position: Sitting, Cuff Size: Large)   Pulse 81   Temp 98.3 F (36.8 C) (Oral)   Resp 16   Ht 5\' 6"  (1.676 m)   Wt (!) 308 lb (139.7 kg)   SpO2 98%   BMI 49.71 kg/m  Assessment & Plan:  1. Essential hypertension Blood pressure is at goal on current medication regimen.  No changes warranted  on today.  Reviewed urinalysis, no proteinuria present.  Will review renal functioning as results become available - Basic Metabolic Panel  2. Morbid obesity with BMI of 45.0-49.9, adult ([facility]) Goal to decrease weight by 10 pounds by 6 month follow up.  Discussed diet and exercise regimen at length Recommend a lowfat, low carbohydrate diet divided over 5-6 small meals, increase water intake to 6-8 glasses, and 150 minutes per week of cardiovascular exercise.   3.  Prediabetes The patient is asked to make an attempt to improve diet and exercise patterns to aid in medical management of this problem. - HgB A1c   RTC: 6 months for hypertension   [name]  MSN, FNP-C [facility] [address] [place], [place] [address] [phone]

## 2017-08-23 ENCOUNTER — Telehealth: Payer: Self-pay

## 2017-08-23 ENCOUNTER — Other Ambulatory Visit: Payer: Self-pay | Admitting: Family Medicine

## 2017-08-23 DIAGNOSIS — R7989 Other specified abnormal findings of blood chemistry: Secondary | ICD-10-CM

## 2017-08-23 LAB — BASIC METABOLIC PANEL
BUN / CREAT RATIO: 10 (ref 9–20)
BUN: 13 mg/dL (ref 6–20)
CALCIUM: 10 mg/dL (ref 8.7–10.2)
CHLORIDE: 98 mmol/L (ref 96–106)
CO2: 26 mmol/L (ref 20–29)
Creatinine, Ser: 1.35 mg/dL — ABNORMAL HIGH (ref 0.76–1.27)
GFR calc Af Amer: 76 mL/min/{1.73_m2} (ref >59)
GFR calc non Af Amer: 66 mL/min/{1.73_m2} (ref >59)
Glucose: 100 mg/dL — ABNORMAL HIGH (ref 65–99)
Potassium: 4.1 mmol/L (ref 3.5–5.2)
SODIUM: 143 mmol/L (ref 134–144)

## 2017-08-23 NOTE — Telephone Encounter (Signed)
-----   Message from Dorena Dew, [place] sent at 08/23/2017  8:27 AM EST ----- Regarding: Lab results Please inform patient that serum creatinine level, and indicator of kidney function is mildly elevated.  Will maintain tight control of hypertension.  Please take medications daily as prescribed.  He also has prediabetes.  Recommend a carbohydrate modify diet, which involves decreasing the amount of rice, pasta, cookies, cake, etc.  Also, cut out soda and sweet juices.  Recommend a low impact cardiovascular exercise routine 3-5 days/week for 30 minutes.  We will recheck creatinine level in 3 months.  Please schedule patient for lab appointment. Thanks

## 2017-08-23 NOTE — Telephone Encounter (Signed)
Called and spoke with patient. Advised that creatinine level is mildly elevated. Advised that this could be due to hypertension. Advised to maintain tight control of hypertension and to take medications daily as prescribed. Also advised that he has pre diabetes and to start a carb modified diet by decreasing rice, pasta, cookies, cake, soda, and sweet juices. Recommended patient start a low impact cardio exercise 3 to 5 times weekly for 30 minutes. Advised that we will recheck creatinine level in 3 months and asked that he keep next scheduled appointment. Thanks!

## 2017-09-17 ENCOUNTER — Encounter (HOSPITAL_COMMUNITY): Payer: Self-pay

## 2017-09-17 ENCOUNTER — Other Ambulatory Visit: Payer: Self-pay

## 2017-09-17 ENCOUNTER — Emergency Department (HOSPITAL_COMMUNITY)
Admission: EM | Admit: 2017-09-17 | Discharge: 2017-09-17 | Disposition: A | Discharge status: Home / Self Care | Payer: Self-pay | Attending: Emergency Medicine | Admitting: Emergency Medicine

## 2017-09-17 DIAGNOSIS — J45909 Unspecified asthma, uncomplicated: Secondary | ICD-10-CM

## 2017-09-17 DIAGNOSIS — Z79899 Other long term (current) drug therapy: Secondary | ICD-10-CM | POA: Insufficient documentation

## 2017-09-17 DIAGNOSIS — Z760 Encounter for issue of repeat prescription: Secondary | ICD-10-CM | POA: Insufficient documentation

## 2017-09-17 DIAGNOSIS — I10 Essential (primary) hypertension: Secondary | ICD-10-CM | POA: Insufficient documentation

## 2017-09-17 MED ORDER — ALBUTEROL SULFATE HFA 108 (90 BASE) MCG/ACT IN AERS
2.0000 | INHALATION_SPRAY | Freq: Once | RESPIRATORY_TRACT | Status: AC
  Administered 2017-09-17: 2 via RESPIRATORY_TRACT
  Filled 2017-09-17: qty 6.7

## 2017-09-17 MED ORDER — ALBUTEROL SULFATE HFA 108 (90 BASE) MCG/ACT IN AERS
1.0000 | INHALATION_SPRAY | Freq: Four times a day (QID) | RESPIRATORY_TRACT | 0 refills | Status: DC | PRN
Start: 2017-09-17 — End: 2019-04-15

## 2017-09-17 NOTE — ED Provider Notes (Signed)
[facility] EMERGENCY DEPARTMENT Provider Note   CSN: [id number] Arrival date & time: 09/17/17  1244     History   Chief Complaint Chief Complaint  Patient presents with  . Cough  . Medication Refill    HPI  Edward Carr is a 42 y.o. Male refill of his albuterol inhaler.  Patient reports his asthma has been intermittently acting up, he has had to use his inhaler 2 days of the last week, problem is now improving.  He reports weather changing often triggers his asthma, denies any other alleviating or aggravating factors.  Patient reports he was having some productive cough and wheezing last night, use the last dose of his albuterol inhaler.  Patient reports his breathing has been improved today, denies any coughing, shortness of breath, chest pain, fevers.  Denies URI symptoms. Reports he has not been able to see his PCP to get a refill.       Past Medical History:  Diagnosis Date  . Asthma   . Hypertension     Patient Active Problem List   Diagnosis Date Noted  . Influenza vaccination given 05/22/2017  . Essential hypertension 08/22/2016  . Asthma 08/22/2016  . Eczema 08/22/2016    Past Surgical History:  Procedure Laterality Date  . DENTAL SURGERY         Home Medications    Prior to Admission medications   Medication Sig Start Date End Date Taking? Authorizing Provider  albuterol (PROVENTIL HFA;VENTOLIN HFA) 108 (90 Base) MCG/ACT inhaler Inhale 1 puff into the lungs every 6 (six) hours as needed for wheezing or shortness of breath. 11/20/16   Dorena Dew, FNP  amLODipine (NORVASC) 10 MG tablet Take 1 tablet (10 mg total) by mouth daily. 08/22/16   Dorena Dew, FNP  cloNIDine (CATAPRES) 0.1 MG tablet Take 1 tablet (0.1 mg total) by mouth 3 (three) times daily. 02/19/17   Dorena Dew, FNP  lisinopril-hydrochlorothiazide (PRINZIDE,ZESTORETIC) 20-25 MG tablet Take 1 tablet by mouth daily. 08/22/16   Dorena Dew, FNP    triamcinolone cream (KENALOG) 0.1 % Apply 1 application topically 2 (two) times daily. 11/20/16   Dorena Dew, FNP    Family History Family History  Problem Relation Age of Onset  . Diabetes Mother   . Cancer Father   . Cancer Sister     Social History Social History   Tobacco Use  . Smoking status: Never Smoker  . Smokeless tobacco: Never Used  Substance Use Topics  . Alcohol use: No  . Drug use: No     Allergies   Patient has no known allergies.   Review of Systems Review of Systems  Constitutional: Negative for chills and fever.  HENT: Negative for congestion, rhinorrhea and sore throat.   Respiratory: Positive for cough and wheezing. Negative for chest tightness and shortness of breath.   Cardiovascular: Negative for chest pain.  Gastrointestinal: Negative for nausea and vomiting.  Skin: Negative for rash.  Neurological: Negative for dizziness and light-headedness.     Physical Exam Updated Vital Signs BP (!) 151/102 (BP Location: Right Arm) Comment: Hx HTN  Pulse 74   Temp 98.3 F (36.8 C) (Oral)   Resp 18   Ht 5\' 6"  (1.676 m)   Wt 136.1 kg (300 lb)   SpO2 97%   BMI 48.42 kg/m   Physical Exam  Constitutional: He appears well-developed and well-nourished. No distress.  HENT:  Head: Normocephalic and atraumatic.  Mouth/Throat: Oropharynx is clear  and moist.  Eyes: Right eye exhibits no discharge. Left eye exhibits no discharge.  Cardiovascular: Normal rate, regular rhythm and normal heart sounds.  Pulmonary/Chest: Effort normal and breath sounds normal. No stridor. No respiratory distress. He has no wheezes. He has no rales.  Respirations equal and unlabored, patient able to speak in full sentences, lungs clear to auscultation bilaterally  Abdominal: Soft. Bowel sounds are normal. He exhibits no distension. There is no tenderness.  Neurological: He is alert. Coordination normal.  Skin: Skin is warm and dry. Capillary refill takes less than 2  seconds. He is not diaphoretic.  Psychiatric: He has a normal mood and affect. His behavior is normal.  Nursing note and vitals reviewed.    ED Treatments / Results  Labs (all labs ordered are listed, but only abnormal results are displayed) Labs Reviewed - No data to display  EKG  EKG Interpretation None       Radiology No results found.  Procedures Procedures (including critical care time)  Medications Ordered in ED Medications  albuterol (PROVENTIL HFA;VENTOLIN HFA) 108 (90 Base) MCG/ACT inhaler 2 puff (2 puffs Inhalation Given 09/17/17 1456)     Initial Impression / Assessment and Plan / ED Course  I have reviewed the triage vital signs and the nursing notes.  Pertinent labs & imaging results that were available during my care of the patient were reviewed by me and considered in my medical decision making (see chart for details).  Presents requesting refill of albuterol inhaler after using his last dose last night.  Patient reports he had some productive coughing and wheezing last night, but this is much improved today.  On exam patient is mildly hypertensive, has not taken his blood pressure medication yet today, vitals otherwise normal, no evidence of respiratory distress.  On exam lungs are clear bilaterally with good air movement.  No active coughing.  Patient provided with albuterol inhaler here in the emergency department as well as a prescription.  Patient counseled to follow-up with his primary care doctor for continued management of his asthma as well as blood pressure recheck.  Strict return precautions discussed.  Patient expressed understanding and is in agreement with plan.  In no acute distress at discharge.  Final Clinical Impressions(s) / ED Diagnoses   Final diagnoses:  Medication refill  Uncomplicated asthma, unspecified asthma severity, unspecified whether persistent    ED Discharge Orders        Ordered    albuterol (PROVENTIL HFA;VENTOLIN HFA) 108  (90 Base) MCG/ACT inhaler  Every 6 hours PRN     09/17/17 1443       Jacqlyn Larsen, PA-C 09/17/17 1522    Daleen Bo, MD 09/19/17 [phone]

## 2017-09-17 NOTE — ED Triage Notes (Signed)
Per Pt, Pt is coming from home with complaints of need for Albuterol refill. He used his last dose last night. Pt had a cough last night and is getting up flem, but denies any other symptoms.

## 2017-09-17 NOTE — Discharge Instructions (Addendum)
Please continue to use your albuterol inhaler needed, if you have difficulty breathing, shortness of breath, chest pain, worsening productive cough, fevers or chills please return to the ED otherwise follow-up with your primary care doctor later this week for continued management of your asthma.

## 2018-02-19 ENCOUNTER — Ambulatory Visit (INDEPENDENT_AMBULATORY_CARE_PROVIDER_SITE_OTHER): Payer: PRIVATE HEALTH INSURANCE | Admitting: Family Medicine

## 2018-02-19 ENCOUNTER — Encounter: Payer: Self-pay | Admitting: Family Medicine

## 2018-02-19 VITALS — BP 140/87 | HR 90 | Temp 98.7°F | Resp 16 | Ht 66.0 in | Wt 300.0 lb

## 2018-02-19 DIAGNOSIS — I10 Essential (primary) hypertension: Secondary | ICD-10-CM | POA: Diagnosis not present

## 2018-02-19 DIAGNOSIS — J45909 Unspecified asthma, uncomplicated: Secondary | ICD-10-CM

## 2018-02-19 DIAGNOSIS — R200 Anesthesia of skin: Secondary | ICD-10-CM

## 2018-02-19 DIAGNOSIS — R202 Paresthesia of skin: Secondary | ICD-10-CM

## 2018-02-19 DIAGNOSIS — Z6841 Body Mass Index (BMI) 40.0 and over, adult: Secondary | ICD-10-CM

## 2018-02-19 LAB — POCT URINALYSIS DIPSTICK
Bilirubin, UA: NEGATIVE
Blood, UA: NEGATIVE
Glucose, UA: NEGATIVE
Ketones, UA: NEGATIVE
Leukocytes, UA: NEGATIVE
Nitrite, UA: NEGATIVE
Protein, UA: NEGATIVE
Spec Grav, UA: 1.02 (ref 1.010–1.025)
Urobilinogen, UA: 0.2 E.U./dL
pH, UA: 6.5 (ref 5.0–8.0)

## 2018-02-19 LAB — POCT GLYCOSYLATED HEMOGLOBIN (HGB A1C): Hemoglobin A1C: 5.9 % — AB (ref 4.0–5.6)

## 2018-02-19 MED ORDER — AMLODIPINE BESYLATE 10 MG PO TABS: 10.0000 mg | ORAL_TABLET | Freq: Every day | ORAL | 1 refills | Status: DC

## 2018-02-19 MED ORDER — CLONIDINE HCL 0.1 MG PO TABS: 0.1000 mg | ORAL_TABLET | Freq: Three times a day (TID) | ORAL | 1 refills | Status: DC

## 2018-02-19 MED ORDER — LISINOPRIL-HYDROCHLOROTHIAZIDE 20-25 MG PO TABS: 1.0000 | ORAL_TABLET | Freq: Every day | ORAL | 1 refills | Status: DC

## 2018-02-19 NOTE — Progress Notes (Signed)
Subjective:    Patient here for follow-up of elevated blood pressure.  He is exercising and cutting down on fried and salty foods.  adherent to a low-salt diet.  Blood pressure is well controlled at home. Cardiac symptoms: none. Patient denies: chest pain, dyspnea, fatigue, lower extremity edema and palpitations. Cardiovascular risk factors: diabetes mellitus, dyslipidemia, hypertension, male gender, obesity (BMI >= 30 kg/m2) and sedentary lifestyle. Use of agents associated with hypertension: none and asthma medications. . History of target organ damage: none. Patient states that he has an area on the left thigh that "goes numb" intermittently. Patient states that there are no alleviating or aggravating factors identified. Has been happening for several months. Denies injury to the area.   The following portions of the patient's history were reviewed and updated as appropriate: allergies, current medications, past family history, past medical history, past social history, past surgical history and problem list.  Review of Systems Pertinent items noted in HPI and remainder of comprehensive ROS otherwise negative.     Objective:    BP 140/87 (BP Location: Left Arm, Patient Position: Sitting, Cuff Size: Large)   Pulse 90   Temp 98.7 F (37.1 C) (Oral)   Resp 16   Ht 5\' 6"  (1.676 m)   Wt 300 lb (136.1 kg)   SpO2 98%   BMI 48.42 kg/m   General Appearance:    Alert, cooperative, no distress, appears stated age  Head:    Normocephalic, without obvious abnormality, atraumatic               Neck:   Supple, symmetrical, trachea midline, no adenopathy;       thyroid:  No enlargement/tenderness/nodules; no carotid   bruit or JVD  Back:     Symmetric, no curvature, ROM normal, no CVA tenderness  Lungs:     Clear to auscultation bilaterally, respirations unlabored  Chest wall:    No tenderness or deformity  Heart:    Regular rate and rhythm, S1 and S2 normal, no murmur, rub   or gallop   Abdomen:     Soft, non-tender, bowel sounds active all four quadrants,    no masses, no organomegaly         Extremities:   Extremities normal, atraumatic, no cyanosis or edema  Pulses:   2+ and symmetric all extremities  Skin:   Skin discoloration noted on face.   Lymph nodes:   Cervical, supraclavicular, and axillary nodes normal  Neurologic:   CNII-XII intact. Normal strength, sensation and reflexes      throughout      Assessment:  The primary encounter diagnosis was Mild asthma without complication, unspecified whether persistent. Diagnoses of Essential hypertension, Morbid obesity with BMI of 45.0-49.9, adult ([place]), and Numbness and tingling of left leg were also pertinent to this visit. Plan:   1. Essential hypertension Continue with current medications - lisinopril-hydrochlorothiazide (PRINZIDE,ZESTORETIC) 20-25 MG tablet; Take 1 tablet by mouth daily.  Dispense: 90 tablet; Refill: 1 - amLODipine (NORVASC) 10 MG tablet; Take 1 tablet (10 mg total) by mouth daily.  Dispense: 90 tablet; Refill: 1 - cloNIDine (CATAPRES) 0.1 MG tablet; Take 1 tablet (0.1 mg total) by mouth 3 (three) times daily.  Dispense: 180 tablet; Refill: 1 - Lipid panel - Comprehensive metabolic panel;  - Microalbumin / creatinine urine ratio;   2. Mild asthma without complication, unspecified whether persistent Discussed using inhaler and calling the office prior to the medication running out and not using the ED unless there is  a serious medical issue such as an asthma attack, chest pain, etc.   3. Morbid obesity with BMI of 45.0-49.9, adult ([place]) We discussed carbohydrate modified diet and increasing exercise and water intake.  A1c:5.9 - Lipid panel

## 2018-02-19 NOTE — Patient Instructions (Signed)
Hypertension Hypertension, commonly called high blood pressure, is when the force of blood pumping through the arteries is too strong. The arteries are the blood vessels that carry blood from the heart throughout the body. Hypertension forces the heart to work harder to pump blood and may cause arteries to become narrow or stiff. Having untreated or uncontrolled hypertension can cause heart attacks, strokes, kidney disease, and other problems. A blood pressure reading consists of a higher number over a lower number. Ideally, your blood pressure should be below 120/80. The first ("top") number is called the systolic pressure. It is a measure of the pressure in your arteries as your heart beats. The second ("bottom") number is called the diastolic pressure. It is a measure of the pressure in your arteries as the heart relaxes. What are the causes? The cause of this condition is not known. What increases the risk? Some risk factors for high blood pressure are under your control. Others are not. Factors you can change  Smoking.  Having type 2 diabetes mellitus, high cholesterol, or both.  Not getting enough exercise or physical activity.  Being overweight.  Having too much fat, sugar, calories, or salt (sodium) in your diet.  Drinking too much alcohol. Factors that are difficult or impossible to change  Having chronic kidney disease.  Having a family history of high blood pressure.  Age. Risk increases with age.  Race. You may be at higher risk if you are African-American.  Gender. Men are at higher risk than women before age 45. After age 65, women are at higher risk than men.  Having obstructive sleep apnea.  Stress. What are the signs or symptoms? Extremely high blood pressure (hypertensive crisis) may cause:  Headache.  Anxiety.  Shortness of breath.  Nosebleed.  Nausea and vomiting.  Severe chest pain.  Jerky movements you cannot control (seizures).  How is this  diagnosed? This condition is diagnosed by measuring your blood pressure while you are seated, with your arm resting on a surface. The cuff of the blood pressure monitor will be placed directly against the skin of your upper arm at the level of your heart. It should be measured at least twice using the same arm. Certain conditions can cause a difference in blood pressure between your right and left arms. Certain factors can cause blood pressure readings to be lower or higher than normal (elevated) for a short period of time:  When your blood pressure is higher when you are in a health care provider's office than when you are at home, this is called white coat hypertension. Most people with this condition do not need medicines.  When your blood pressure is higher at home than when you are in a health care provider's office, this is called masked hypertension. Most people with this condition may need medicines to control blood pressure.  If you have a high blood pressure reading during one visit or you have normal blood pressure with other risk factors:  You may be asked to return on a different day to have your blood pressure checked again.  You may be asked to monitor your blood pressure at home for 1 week or longer.  If you are diagnosed with hypertension, you may have other blood or imaging tests to help your health care provider understand your overall risk for other conditions. How is this treated? This condition is treated by making healthy lifestyle changes, such as eating healthy foods, exercising more, and reducing your alcohol intake. Your   Your health care provider may prescribe medicine if lifestyle changes are not enough to get your blood pressure under control, and if:  Your systolic blood pressure is above 130.  Your diastolic blood pressure is above 80.  Your personal target blood pressure may vary depending on your medical conditions, your age, and other factors. Follow these  instructions at home: Eating and drinking  Eat a diet that is high in fiber and potassium, and low in sodium, added sugar, and fat. An example eating plan is called the DASH (Dietary Approaches to Stop Hypertension) diet. To eat this way: ? Eat plenty of fresh fruits and vegetables. Try to fill half of your plate at each meal with fruits and vegetables. ? Eat whole grains, such as whole wheat pasta, brown rice, or whole grain bread. Fill about one quarter of your plate with whole grains. ? Eat or drink low-fat dairy products, such as skim milk or low-fat yogurt. ? Avoid fatty cuts of meat, processed or cured meats, and poultry with skin. Fill about one quarter of your plate with lean proteins, such as fish, chicken without skin, beans, eggs, and tofu. ? Avoid premade and processed foods. These tend to be higher in sodium, added sugar, and fat.  Reduce your daily sodium intake. Most people with hypertension should eat less than 1,500 mg of sodium a day.  Limit alcohol intake to no more than 1 drink a day for nonpregnant women and 2 drinks a day for men. One drink equals 12 oz of beer, 5 oz of wine, or 1 oz of hard liquor. Lifestyle  Work with your health care provider to maintain a healthy body weight or to lose weight. Ask what an ideal weight is for you.  Get at least 30 minutes of exercise that causes your heart to beat faster (aerobic exercise) most days of the week. Activities may include walking, swimming, or biking.  Include exercise to strengthen your muscles (resistance exercise), such as pilates or lifting weights, as part of your weekly exercise routine. Try to do these types of exercises for 30 minutes at least 3 days a week.  Do not use any products that contain nicotine or tobacco, such as cigarettes and e-cigarettes. If you need help quitting, ask your health care provider.  Monitor your blood pressure at home as told by your health care provider.  Keep all follow-up visits as  told by your health care provider. This is important. Medicines  Take over-the-counter and prescription medicines only as told by your health care provider. Follow directions carefully. Blood pressure medicines must be taken as prescribed.  Do not skip doses of blood pressure medicine. Doing this puts you at risk for problems and can make the medicine less effective.  Ask your health care provider about side effects or reactions to medicines that you should watch for. Contact a health care provider if:  You think you are having a reaction to a medicine you are taking.  You have headaches that keep coming back (recurring).  You feel dizzy.  You have swelling in your ankles.  You have trouble with your vision. Get help right away if:  You develop a severe headache or confusion.  You have unusual weakness or numbness.  You feel faint.  You have severe pain in your chest or abdomen.  You vomit repeatedly.  You have trouble breathing. Summary  Hypertension is when the force of blood pumping through your arteries is too strong. If this condition is  controlled, it may put you at risk for serious complications.  Your personal target blood pressure may vary depending on your medical conditions, your age, and other factors. For most people, a normal blood pressure is less than 120/80.  Hypertension is treated with lifestyle changes, medicines, or a combination of both. Lifestyle changes include weight loss, eating a healthy, low-sodium diet, exercising more, and limiting alcohol. This information is not intended to replace advice given to you by your health care provider. Make sure you discuss any questions you have with your health care provider. Document Released: 07/16/2005 Document Revised: 06/13/2016 Document Reviewed: 06/13/2016 Elsevier Interactive Patient Education  2018 [employer] DASH Eating Plan DASH stands for "Dietary Approaches to Stop Hypertension." The DASH  eating plan is a healthy eating plan that has been shown to reduce high blood pressure (hypertension). It may also reduce your risk for type 2 diabetes, heart disease, and stroke. The DASH eating plan may also help with weight loss. What are tips for following this plan? General guidelines  Avoid eating more than 2,300 mg (milligrams) of salt (sodium) a day. If you have hypertension, you may need to reduce your sodium intake to 1,500 mg a day.  Limit alcohol intake to no more than 1 drink a day for nonpregnant women and 2 drinks a day for men. One drink equals 12 oz of beer, 5 oz of wine, or 1 oz of hard liquor.  Work with your health care provider to maintain a healthy body weight or to lose weight. Ask what an ideal weight is for you.  Get at least 30 minutes of exercise that causes your heart to beat faster (aerobic exercise) most days of the week. Activities may include walking, swimming, or biking.  Work with your health care provider or diet and nutrition specialist (dietitian) to adjust your eating plan to your individual calorie needs. Reading food labels  Check food labels for the amount of sodium per serving. Choose foods with less than 5 percent of the Daily Value of sodium. Generally, foods with less than 300 mg of sodium per serving fit into this eating plan.  To find whole grains, look for the word "whole" as the first word in the ingredient list. Shopping  Buy products labeled as "low-sodium" or "no salt added."  Buy fresh foods. Avoid canned foods and premade or frozen meals. Cooking  Avoid adding salt when cooking. Use salt-free seasonings or herbs instead of table salt or sea salt. Check with your health care provider or pharmacist before using salt substitutes.  Do not fry foods. Cook foods using healthy methods such as baking, boiling, grilling, and broiling instead.  Cook with heart-healthy oils, such as olive, canola, soybean, or sunflower oil. Meal  planning   Eat a balanced diet that includes: ? 5 or more servings of fruits and vegetables each day. At each meal, try to fill half of your plate with fruits and vegetables. ? Up to 6-8 servings of whole grains each day. ? Less than 6 oz of lean meat, poultry, or fish each day. A 3-oz serving of meat is about the same size as a deck of cards. One egg equals 1 oz. ? 2 servings of low-fat dairy each day. ? A serving of nuts, seeds, or beans 5 times each week. ? Heart-healthy fats. Healthy fats called Omega-3 fatty acids are found in foods such as flaxseeds and coldwater fish, like sardines, salmon, and mackerel.  Limit how much you eat of the   following: ? Canned or prepackaged foods. ? Food that is high in trans fat, such as fried foods. ? Food that is high in saturated fat, such as fatty meat. ? Sweets, desserts, sugary drinks, and other foods with added sugar. ? Full-fat dairy products.  Do not salt foods before eating.  Try to eat at least 2 vegetarian meals each week.  Eat more home-cooked food and less restaurant, buffet, and fast food.  When eating at a restaurant, ask that your food be prepared with less salt or no salt, if possible. What foods are recommended? The items listed may not be a complete list. Talk with your dietitian about what dietary choices are best for you. Grains Whole-grain or whole-wheat bread. Whole-grain or whole-wheat pasta. Brown rice. Oatmeal. Quinoa. Bulgur. Whole-grain and low-sodium cereals. Pita bread. Low-fat, low-sodium crackers. Whole-wheat flour tortillas. Vegetables Fresh or frozen vegetables (raw, steamed, roasted, or grilled). Low-sodium or reduced-sodium tomato and vegetable juice. Low-sodium or reduced-sodium tomato sauce and tomato paste. Low-sodium or reduced-sodium canned vegetables. Fruits All fresh, dried, or frozen fruit. Canned fruit in natural juice (without added sugar). Meat and other protein foods Skinless chicken or turkey.  Ground chicken or turkey. Pork with fat trimmed off. Fish and seafood. Egg whites. Dried beans, peas, or lentils. Unsalted nuts, nut butters, and seeds. Unsalted canned beans. Lean cuts of beef with fat trimmed off. Low-sodium, lean deli meat. Dairy Low-fat (1%) or fat-free (skim) milk. Fat-free, low-fat, or reduced-fat cheeses. Nonfat, low-sodium ricotta or cottage cheese. Low-fat or nonfat yogurt. Low-fat, low-sodium cheese. Fats and oils Soft margarine without trans fats. Vegetable oil. Low-fat, reduced-fat, or light mayonnaise and salad dressings (reduced-sodium). Canola, safflower, olive, soybean, and sunflower oils. Avocado. Seasoning and other foods Herbs. Spices. Seasoning mixes without salt. Unsalted popcorn and pretzels. Fat-free sweets. What foods are not recommended? The items listed may not be a complete list. Talk with your dietitian about what dietary choices are best for you. Grains Baked goods made with fat, such as croissants, muffins, or some breads. Dry pasta or rice meal packs. Vegetables Creamed or fried vegetables. Vegetables in a cheese sauce. Regular canned vegetables (not low-sodium or reduced-sodium). Regular canned tomato sauce and paste (not low-sodium or reduced-sodium). Regular tomato and vegetable juice (not low-sodium or reduced-sodium). Pickles. Olives. Fruits Canned fruit in a light or heavy syrup. Fried fruit. Fruit in cream or butter sauce. Meat and other protein foods Fatty cuts of meat. Ribs. Fried meat. Bacon. Sausage. Bologna and other processed lunch meats. Salami. Fatback. Hotdogs. Bratwurst. Salted nuts and seeds. Canned beans with added salt. Canned or smoked fish. Whole eggs or egg yolks. Chicken or turkey with skin. Dairy Whole or 2% milk, cream, and half-and-half. Whole or full-fat cream cheese. Whole-fat or sweetened yogurt. Full-fat cheese. Nondairy creamers. Whipped toppings. Processed cheese and cheese spreads. Fats and oils Butter. Stick  margarine. Lard. Shortening. Ghee. Bacon fat. Tropical oils, such as coconut, palm kernel, or palm oil. Seasoning and other foods Salted popcorn and pretzels. Onion salt, garlic salt, seasoned salt, table salt, and sea salt. Worcestershire sauce. Tartar sauce. Barbecue sauce. Teriyaki sauce. Soy sauce, including reduced-sodium. Steak sauce. Canned and packaged gravies. Fish sauce. Oyster sauce. Cocktail sauce. Horseradish that you find on the shelf. Ketchup. Mustard. Meat flavorings and tenderizers. Bouillon cubes. Hot sauce and Tabasco sauce. Premade or packaged marinades. Premade or packaged taco seasonings. Relishes. Regular salad dressings. Where to find more information:  National Heart, Lung, and Blood Institute: www.nhlbi.nih.gov  American Heart Association: www.heart.org   Summary  The DASH eating plan is a healthy eating plan that has been shown to reduce high blood pressure (hypertension). It may also reduce your risk for type 2 diabetes, heart disease, and stroke.  With the DASH eating plan, you should limit salt (sodium) intake to 2,300 mg a day. If you have hypertension, you may need to reduce your sodium intake to 1,500 mg a day.  When on the DASH eating plan, aim to eat more fresh fruits and vegetables, whole grains, lean proteins, low-fat dairy, and heart-healthy fats.  Work with your health care provider or diet and nutrition specialist (dietitian) to adjust your eating plan to your individual calorie needs. This information is not intended to replace advice given to you by your health care provider. Make sure you discuss any questions you have with your health care provider. Document Released: 07/05/2011 Document Revised: 07/09/2016 Document Reviewed: 07/09/2016 Elsevier Interactive Patient Education  2018 Elsevier Inc.  

## 2018-02-19 NOTE — Progress Notes (Signed)
Subjective:    Patient here for follow-up of elevated blood pressure.  He is exercising and cutting down on fried and salty foods.  adherent to a low-salt diet.  Blood pressure is well controlled at home. Cardiac symptoms: none. Patient denies: chest pain, dyspnea, fatigue, lower extremity edema and palpitations. Cardiovascular risk factors: diabetes mellitus, dyslipidemia, hypertension, male gender, obesity (BMI >= 30 kg/m2) and sedentary lifestyle. Use of agents associated with hypertension: none and asthma medications. . History of target organ damage: none.  The following portions of the patient's history were reviewed and updated as appropriate: allergies, current medications, past family history, past medical history, past social history, past surgical history and problem list.  Review of Systems Pertinent items noted in HPI and remainder of comprehensive ROS otherwise negative.     Objective:    BP 140/87 (BP Location: Left Arm, Patient Position: Sitting, Cuff Size: Large)   Pulse 90   Temp 98.7 F (37.1 C) (Oral)   Resp 16   Ht 5\' 6"  (1.676 m)   Wt 300 lb (136.1 kg)   SpO2 98%   BMI 48.42 kg/m   General Appearance:    Alert, cooperative, no distress, appears stated age  Head:    Normocephalic, without obvious abnormality, atraumatic  Eyes:    PERRL, conjunctiva/corneas clear, EOM's intact, fundi    benign, both eyes       Ears:    Normal TM's and external ear canals, both ears  Nose:   Nares normal, septum midline, mucosa normal, no drainage    or sinus tenderness  Throat:   Lips, mucosa, and tongue normal; teeth and gums normal  Neck:   Supple, symmetrical, trachea midline, no adenopathy;       thyroid:  No enlargement/tenderness/nodules; no carotid   bruit or JVD  Back:     Symmetric, no curvature, ROM normal, no CVA tenderness  Lungs:     Clear to auscultation bilaterally, respirations unlabored  Chest wall:    No tenderness or deformity  Heart:    Regular rate and  rhythm, S1 and S2 normal, no murmur, rub   or gallop  Abdomen:     Soft, non-tender, bowel sounds active all four quadrants,    no masses, no organomegaly  Genitalia:    Normal male without lesion, discharge or tenderness  Rectal:    Normal tone, normal prostate, no masses or tenderness;   guaiac negative stool  Extremities:   Extremities normal, atraumatic, no cyanosis or edema  Pulses:   2+ and symmetric all extremities  Skin:   Skin color, texture, turgor normal, no rashes or lesions  Lymph nodes:   Cervical, supraclavicular, and axillary nodes normal  Neurologic:   CNII-XII intact. Normal strength, sensation and reflexes      throughout      Assessment:  The primary encounter diagnosis was Mild asthma without complication, unspecified whether persistent. Diagnoses of Essential hypertension, Morbid obesity with BMI of 45.0-49.9, adult ([place]), and Numbness and tingling of left leg were also pertinent to this visit. Plan:   1. Essential hypertension Continue with current medications - lisinopril-hydrochlorothiazide (PRINZIDE,ZESTORETIC) 20-25 MG tablet; Take 1 tablet by mouth daily.  Dispense: 90 tablet; Refill: 1 - amLODipine (NORVASC) 10 MG tablet; Take 1 tablet (10 mg total) by mouth daily.  Dispense: 90 tablet; Refill: 1 - cloNIDine (CATAPRES) 0.1 MG tablet; Take 1 tablet (0.1 mg total) by mouth 3 (three) times daily.  Dispense: 180 tablet; Refill: 1 - Lipid panel; Future -  Comprehensive metabolic panel; Future - Microalbumin / creatinine urine ratio; Future  2. Mild asthma without complication, unspecified whether persistent Discussed using inhaler and calling the office prior to the medication running out and not using the ED unless there is a serious medical issue such as an asthma attack, chest pain, etc.   3. Morbid obesity with BMI of 45.0-49.9, adult ([place]) We discussed carbohydrate modified diet and increasing exercise and water intake.  A1c - Lipid panel; Future

## 2018-02-20 ENCOUNTER — Other Ambulatory Visit: Payer: Self-pay | Admitting: Family Medicine

## 2018-02-20 DIAGNOSIS — R200 Anesthesia of skin: Secondary | ICD-10-CM

## 2018-02-20 DIAGNOSIS — R202 Paresthesia of skin: Secondary | ICD-10-CM

## 2018-02-20 LAB — LIPID PANEL
Chol/HDL Ratio: 4.5 ratio (ref 0.0–5.0)
Cholesterol, Total: 166 mg/dL (ref 100–199)
HDL: 37 mg/dL — ABNORMAL LOW (ref >39)
LDL Calculated: 105 mg/dL — ABNORMAL HIGH (ref 0–99)
Triglycerides: 118 mg/dL (ref 0–149)
VLDL Cholesterol Cal: 24 mg/dL (ref 5–40)

## 2018-02-20 LAB — COMPREHENSIVE METABOLIC PANEL
ALT: 16 IU/L (ref 0–44)
AST: 17 IU/L (ref 0–40)
Albumin/Globulin Ratio: 1.4 (ref 1.2–2.2)
Albumin: 4.3 g/dL (ref 3.5–5.5)
Alkaline Phosphatase: 78 IU/L (ref 39–117)
BUN/Creatinine Ratio: 11 (ref 9–20)
BUN: 13 mg/dL (ref 6–20)
Bilirubin Total: 0.7 mg/dL (ref 0.0–1.2)
CO2: 25 mmol/L (ref 20–29)
Calcium: 9.8 mg/dL (ref 8.7–10.2)
Chloride: 97 mmol/L (ref 96–106)
Creatinine, Ser: 1.15 mg/dL (ref 0.76–1.27)
GFR calc Af Amer: 92 mL/min/{1.73_m2} (ref >59)
GFR calc non Af Amer: 80 mL/min/{1.73_m2} (ref >59)
Globulin, Total: 3.1 g/dL (ref 1.5–4.5)
Glucose: 100 mg/dL — ABNORMAL HIGH (ref 65–99)
Potassium: 3.3 mmol/L — ABNORMAL LOW (ref 3.5–5.2)
Sodium: 138 mmol/L (ref 134–144)
Total Protein: 7.4 g/dL (ref 6.0–8.5)

## 2018-02-20 LAB — VITAMIN D 25 HYDROXY (VIT D DEFICIENCY, FRACTURES): Vit D, 25-Hydroxy: 11.3 ng/mL — ABNORMAL LOW (ref 30.0–100.0)

## 2018-02-20 LAB — VITAMIN B12: Vitamin B-12: 219 pg/mL — ABNORMAL LOW (ref 232–1245)

## 2018-02-20 LAB — MICROALBUMIN / CREATININE URINE RATIO
Creatinine, Urine: 196.4 mg/dL
Microalb/Creat Ratio: 2.9 mg/g creat (ref 0.0–30.0)
Microalbumin, Urine: 5.7 ug/mL

## 2018-02-20 MED ORDER — CYANOCOBALAMIN 500 MCG PO TABS: 500.0000 ug | ORAL_TABLET | Freq: Every day | ORAL | 2 refills | Status: AC

## 2018-02-20 MED ORDER — VITAMIN D (ERGOCALCIFEROL) 1.25 MG (50000 UNIT) PO CAPS: 50000.0000 [IU] | ORAL_CAPSULE | ORAL | 0 refills | Status: AC

## 2018-02-20 NOTE — Progress Notes (Signed)
Added vitamin d and b12

## 2018-02-21 ENCOUNTER — Telehealth: Payer: Self-pay

## 2018-02-21 NOTE — Telephone Encounter (Signed)
Called and spoke with patient, advised that vitamin d and b12 were low and that a supplement has been sent into the pharmacy for this. Patient verbalized understanding. Thanks!

## 2018-02-21 NOTE — Telephone Encounter (Signed)
-----   Message from Lanae Boast, [place] sent at 02/20/2018  5:16 PM EDT ----- Both the vitamin d and b12 are low. Will send in a supplement. No other medication changes.

## 2018-08-22 ENCOUNTER — Encounter: Payer: Self-pay | Admitting: Family Medicine

## 2018-08-22 ENCOUNTER — Ambulatory Visit (INDEPENDENT_AMBULATORY_CARE_PROVIDER_SITE_OTHER): Payer: Self-pay | Admitting: Family Medicine

## 2018-08-22 VITALS — BP 138/92 | HR 85 | Temp 98.4°F | Resp 16 | Ht 66.0 in | Wt 318.0 lb

## 2018-08-22 DIAGNOSIS — I10 Essential (primary) hypertension: Secondary | ICD-10-CM

## 2018-08-22 DIAGNOSIS — Z23 Encounter for immunization: Secondary | ICD-10-CM

## 2018-08-22 LAB — POCT URINALYSIS DIPSTICK
Bilirubin, UA: NEGATIVE
Blood, UA: NEGATIVE
Glucose, UA: NEGATIVE
Ketones, UA: NEGATIVE
Leukocytes, UA: NEGATIVE
Nitrite, UA: NEGATIVE
Protein, UA: NEGATIVE
Spec Grav, UA: 1.02 (ref 1.010–1.025)
Urobilinogen, UA: 0.2 E.U./dL
pH, UA: 5.5 (ref 5.0–8.0)

## 2018-08-22 MED ORDER — LISINOPRIL-HYDROCHLOROTHIAZIDE 20-25 MG PO TABS: 1.0000 | ORAL_TABLET | Freq: Every day | ORAL | 1 refills | Status: DC

## 2018-08-22 MED ORDER — CLONIDINE HCL 0.1 MG PO TABS: 0.2000 mg | ORAL_TABLET | Freq: Every day | ORAL | 1 refills | Status: DC

## 2018-08-22 MED ORDER — AMLODIPINE BESYLATE 10 MG PO TABS: 10.0000 mg | ORAL_TABLET | Freq: Every day | ORAL | 1 refills | Status: DC

## 2018-08-22 NOTE — Progress Notes (Signed)
[facility]   Progress Note: General Provider: Lanae Boast, FNP  SUBJECTIVE:   Edward [facility] is a 43 y.o. male who  has a past medical history of Asthma and Hypertension.. Patient presents today for Hypertension; Asthma; and Numbness (in arms at night ) Patient presents for follow-up on hypertension.  He states that he does not take clonidine 3 times a day as previously prescribed.  There are some days that he just takes all 3 pills prior to going to bed.  Patient states that he is compliant with the amlodipine and lisinopril HCTZ combination pill. Patient with history of asthma.  He states that he is well controlled on the albuterol inhaler.  Denies chest pain shortness of breath dizziness or leg swelling today. Patient also states that he is having bilateral tingling of the hands especially at night.  He does report doing a repetitive job with his hands.  Review of Systems  Constitutional: Negative.   HENT: Negative.   Eyes: Negative.   Respiratory: Negative.   Cardiovascular: Negative.   Gastrointestinal: Negative.   Genitourinary: Negative.   Musculoskeletal: Negative.   Skin: Negative.   Neurological: Positive for tingling (Bilateral hands especially at night).  Psychiatric/Behavioral: Negative.      OBJECTIVE: BP (!) 142/98 (BP Location: Left Arm, Patient Position: Sitting, Cuff Size: Large) Comment: manually  Pulse 85   Temp 98.4 F (36.9 C) (Oral)   Resp 16   Ht 5\' 6"  (1.676 m)   Wt (!) 318 lb (144.2 kg)   SpO2 98%   BMI 51.33 kg/m   Wt Readings from Last 3 Encounters:  08/22/18 (!) 318 lb (144.2 kg)  02/19/18 300 lb (136.1 kg)  09/17/17 300 lb (136.1 kg)     Physical Exam Vitals signs and nursing note reviewed.  Constitutional:      General: He is not in acute distress.    Appearance: He is well-developed.  HENT:     Head: Normocephalic and atraumatic.  Eyes:     Conjunctiva/sclera: Conjunctivae normal.       Pupils: Pupils are equal, round, and reactive to light.  Neck:     Musculoskeletal: Normal range of motion.  Cardiovascular:     Rate and Rhythm: Normal rate and regular rhythm.     Heart sounds: Normal heart sounds.  Pulmonary:     Effort: Pulmonary effort is normal. No respiratory distress.     Breath sounds: Normal breath sounds.  Abdominal:     General: Bowel sounds are normal. There is no distension.     Palpations: Abdomen is soft.  Musculoskeletal: Normal range of motion.  Skin:    General: Skin is warm and dry.  Neurological:     Mental Status: He is alert and oriented to person, place, and time.  Psychiatric:        Mood and Affect: Mood normal.        Behavior: Behavior normal.        Thought Content: Thought content normal.        Judgment: Judgment normal.     ASSESSMENT/PLAN:   1. Essential hypertension Would like to taper patient off of clonidine due to noncompliance with medication because of 3 times daily dosing.  Patient advised to start taking two 0.1 mg pills at night prior to bed x1 week and then decrease to 0.1 mg nightly for 1 week.  He is advised to return to the clinic after this to discuss discontinuation.  Consider atenolol to help to wean, the patient is asthmatic.  Currently he is doing well on his medications.  We will continue to monitor. - Urinalysis Dipstick - lisinopril-hydrochlorothiazide (PRINZIDE,ZESTORETIC) 20-25 MG tablet; Take 1 tablet by mouth daily.  Dispense: 90 tablet; Refill: 1 - amLODipine (NORVASC) 10 MG tablet; Take 1 tablet (10 mg total) by mouth daily.  Dispense: 90 tablet; Refill: 1 - cloNIDine (CATAPRES) 0.1 MG tablet; Take 2 tablets (0.2 mg total) by mouth at bedtime. X 1 week then decrease to one tablet QHS.  Dispense: 60 tablet; Refill: 1  The patient is asked to make an attempt to improve diet and exercise patterns to aid in medical management of this problem.   Return in about 2 weeks (around 09/05/2018) for HTN.     The patient was given clear instructions to go to ER or return to medical center if symptoms do not improve, worsen or new problems develop. The patient verbalized understanding and agreed with plan of care.   Ms. Doug Sou. Nathaneil Canary, FNP-BC Patient [facility] [address] [place], [place] [address] [phone]

## 2018-08-22 NOTE — Patient Instructions (Addendum)
Take 2 ibuprofen at night before bed to help with the numbness and tingling of your wrists.  Remember to take 2 clonidine at night for 1 week and then 1 pill at night.    DASH Eating Plan DASH stands for "Dietary Approaches to Stop Hypertension." The DASH eating plan is a healthy eating plan that has been shown to reduce high blood pressure (hypertension). It may also reduce your risk for type 2 diabetes, heart disease, and stroke. The DASH eating plan may also help with weight loss. What are tips for following this plan?  General guidelines  Avoid eating more than 2,300 mg (milligrams) of salt (sodium) a day. If you have hypertension, you may need to reduce your sodium intake to 1,500 mg a day.  Limit alcohol intake to no more than 1 drink a day for nonpregnant women and 2 drinks a day for men. One drink equals 12 oz of beer, 5 oz of wine, or 1 oz of hard liquor.  Work with your health care provider to maintain a healthy body weight or to lose weight. Ask what an ideal weight is for you.  Get at least 30 minutes of exercise that causes your heart to beat faster (aerobic exercise) most days of the week. Activities may include walking, swimming, or biking.  Work with your health care provider or diet and nutrition specialist (dietitian) to adjust your eating plan to your individual calorie needs. Reading food labels   Check food labels for the amount of sodium per serving. Choose foods with less than 5 percent of the Daily Value of sodium. Generally, foods with less than 300 mg of sodium per serving fit into this eating plan.  To find whole grains, look for the word "whole" as the first word in the ingredient list. Shopping  Buy products labeled as "low-sodium" or "no salt added."  Buy fresh foods. Avoid canned foods and premade or frozen meals. Cooking  Avoid adding salt when cooking. Use salt-free seasonings or herbs instead of table salt or sea salt. Check with your health care  provider or pharmacist before using salt substitutes.  Do not fry foods. Cook foods using healthy methods such as baking, boiling, grilling, and broiling instead.  Cook with heart-healthy oils, such as olive, canola, soybean, or sunflower oil. Meal planning  Eat a balanced diet that includes: ? 5 or more servings of fruits and vegetables each day. At each meal, try to fill half of your plate with fruits and vegetables. ? Up to 6-8 servings of whole grains each day. ? Less than 6 oz of lean meat, poultry, or fish each day. A 3-oz serving of meat is about the same size as a deck of cards. One egg equals 1 oz. ? 2 servings of low-fat dairy each day. ? A serving of nuts, seeds, or beans 5 times each week. ? Heart-healthy fats. Healthy fats called Omega-3 fatty acids are found in foods such as flaxseeds and coldwater fish, like sardines, salmon, and mackerel.  Limit how much you eat of the following: ? Canned or prepackaged foods. ? Food that is high in trans fat, such as fried foods. ? Food that is high in saturated fat, such as fatty meat. ? Sweets, desserts, sugary drinks, and other foods with added sugar. ? Full-fat dairy products.  Do not salt foods before eating.  Try to eat at least 2 vegetarian meals each week.  Eat more home-cooked food and less restaurant, buffet, and fast food.  When eating at a restaurant, ask that your food be prepared with less salt or no salt, if possible. What foods are recommended? The items listed may not be a complete list. Talk with your dietitian about what dietary choices are best for you. Grains Whole-grain or whole-wheat bread. Whole-grain or whole-wheat pasta. Brown rice. [place]. Bulgur. Whole-grain and low-sodium cereals. Pita bread. Low-fat, low-sodium crackers. Whole-wheat flour tortillas. Vegetables Fresh or frozen vegetables (raw, steamed, roasted, or grilled). Low-sodium or reduced-sodium tomato and vegetable juice. Low-sodium or  reduced-sodium tomato sauce and tomato paste. Low-sodium or reduced-sodium canned vegetables. Fruits All fresh, dried, or frozen fruit. Canned fruit in natural juice (without added sugar). Meat and other protein foods Skinless chicken or [place]. Ground chicken or [place]. Pork with fat trimmed off. Fish and seafood. Egg whites. Dried beans, peas, or lentils. Unsalted nuts, nut butters, and seeds. Unsalted canned beans. Lean cuts of beef with fat trimmed off. Low-sodium, lean deli meat. Dairy Low-fat (1%) or fat-free (skim) milk. Fat-free, low-fat, or reduced-fat cheeses. Nonfat, low-sodium ricotta or cottage cheese. Low-fat or nonfat yogurt. Low-fat, low-sodium cheese. Fats and oils Soft margarine without trans fats. Vegetable oil. Low-fat, reduced-fat, or light mayonnaise and salad dressings (reduced-sodium). Canola, safflower, olive, soybean, and sunflower oils. Avocado. Seasoning and other foods Herbs. Spices. Seasoning mixes without salt. Unsalted popcorn and pretzels. Fat-free sweets. What foods are not recommended? The items listed may not be a complete list. Talk with your dietitian about what dietary choices are best for you. Grains Baked goods made with fat, such as croissants, muffins, or some breads. Dry pasta or rice meal packs. Vegetables Creamed or fried vegetables. Vegetables in a cheese sauce. Regular canned vegetables (not low-sodium or reduced-sodium). Regular canned tomato sauce and paste (not low-sodium or reduced-sodium). Regular tomato and vegetable juice (not low-sodium or reduced-sodium). Angie Fava. Olives. Fruits Canned fruit in a light or heavy syrup. Fried fruit. Fruit in cream or butter sauce. Meat and other protein foods Fatty cuts of meat. Ribs. Fried meat. Berniece Salines. Sausage. Bologna and other processed lunch meats. Salami. Fatback. Hotdogs. Bratwurst. Salted nuts and seeds. Canned beans with added salt. Canned or smoked fish. Whole eggs or egg yolks. Chicken or [place]  with skin. Dairy Whole or 2% milk, cream, and half-and-half. Whole or full-fat cream cheese. Whole-fat or sweetened yogurt. Full-fat cheese. Nondairy creamers. Whipped toppings. Processed cheese and cheese spreads. Fats and oils Butter. Stick margarine. Lard. Shortening. Ghee. Bacon fat. Tropical oils, such as coconut, palm kernel, or palm oil. Seasoning and other foods Salted popcorn and pretzels. Onion salt, garlic salt, seasoned salt, table salt, and sea salt. Worcestershire sauce. Tartar sauce. Barbecue sauce. Teriyaki sauce. Soy sauce, including reduced-sodium. Steak sauce. Canned and packaged gravies. Fish sauce. Oyster sauce. Cocktail sauce. Horseradish that you find on the shelf. Ketchup. Mustard. Meat flavorings and tenderizers. Bouillon cubes. Hot sauce and Tabasco sauce. Premade or packaged marinades. Premade or packaged taco seasonings. Relishes. Regular salad dressings. Where to find more information:  National Heart, Lung, and [place]: https://wilson-eaton.com/  American Heart Association: www.heart.org Summary  The DASH eating plan is a healthy eating plan that has been shown to reduce high blood pressure (hypertension). It may also reduce your risk for type 2 diabetes, heart disease, and stroke.  With the DASH eating plan, you should limit salt (sodium) intake to 2,300 mg a day. If you have hypertension, you may need to reduce your sodium intake to 1,500 mg a day.  When on the DASH eating plan,  aim to eat more fresh fruits and vegetables, whole grains, lean proteins, low-fat dairy, and heart-healthy fats.  Work with your health care provider or diet and nutrition specialist (dietitian) to adjust your eating plan to your individual calorie needs. This information is not intended to replace advice given to you by your health care provider. Make sure you discuss any questions you have with your health care provider. Document Released: 07/05/2011 Document Revised: 07/09/2016  Document Reviewed: 07/09/2016 Elsevier Interactive Patient Education  2019 [employer].

## 2018-09-05 ENCOUNTER — Ambulatory Visit (INDEPENDENT_AMBULATORY_CARE_PROVIDER_SITE_OTHER): Payer: Self-pay | Admitting: Family Medicine

## 2018-09-05 VITALS — BP 115/80 | HR 75 | Temp 98.3°F | Resp 16 | Ht 66.0 in | Wt 317.0 lb

## 2018-09-05 DIAGNOSIS — I10 Essential (primary) hypertension: Secondary | ICD-10-CM

## 2018-09-05 LAB — POCT URINALYSIS DIPSTICK
Bilirubin, UA: NEGATIVE
Blood, UA: NEGATIVE
Glucose, UA: NEGATIVE
Ketones, UA: NEGATIVE
Leukocytes, UA: NEGATIVE
Nitrite, UA: NEGATIVE
Protein, UA: NEGATIVE
Spec Grav, UA: 1.02 (ref 1.010–1.025)
Urobilinogen, UA: 0.2 E.U./dL
pH, UA: 5.5 (ref 5.0–8.0)

## 2018-09-05 MED ORDER — CLONIDINE HCL 0.1 MG PO TABS: 0.1000 mg | ORAL_TABLET | Freq: Every day | ORAL | 1 refills | Status: DC

## 2018-09-05 NOTE — Progress Notes (Signed)
  Patient [facility]   Progress Note: General Provider: Lanae Boast, FNP  SUBJECTIVE:   Edward Carr is a 43 y.o. male who  has a past medical history of Asthma and Hypertension.. Patient presents today for Hypertension   Patient is doing well on current medications. He reports compliance with medications and denies side effects of medications. He would like to continue on his current regimen. Patient reports sleeping well with decreased clonidine.  Review of Systems  Constitutional: Negative.   HENT: Negative.   Eyes: Negative.   Respiratory: Negative.   Cardiovascular: Negative.   Gastrointestinal: Negative.   Genitourinary: Negative.   Musculoskeletal: Negative.   Skin: Negative.   Neurological: Negative.   Psychiatric/Behavioral: Negative.      OBJECTIVE: BP 115/80 (BP Location: Left Arm, Patient Position: Sitting, Cuff Size: Large)   Pulse 75   Temp 98.3 F (36.8 C) (Oral)   Resp 16   Ht 5\' 6"  (1.676 m)   Wt (!) 317 lb (143.8 kg)   SpO2 98%   BMI 51.17 kg/m   Wt Readings from Last 3 Encounters:  09/05/18 (!) 317 lb (143.8 kg)  08/22/18 (!) 318 lb (144.2 kg)  02/19/18 300 lb (136.1 kg)     Physical Exam Vitals signs and nursing note reviewed.  Constitutional:      General: He is not in acute distress.    Appearance: He is well-developed.  HENT:     Head: Normocephalic and atraumatic.  Eyes:     Conjunctiva/sclera: Conjunctivae normal.     Pupils: Pupils are equal, round, and reactive to light.  Neck:     Musculoskeletal: Normal range of motion.  Cardiovascular:     Rate and Rhythm: Normal rate and regular rhythm.     Heart sounds: Normal heart sounds.  Pulmonary:     Effort: Pulmonary effort is normal. No respiratory distress.     Breath sounds: Normal breath sounds.  Abdominal:     General: Bowel sounds are normal. There is no distension.     Palpations: Abdomen is soft.  Musculoskeletal: Normal range of  motion.  Skin:    General: Skin is warm and dry.  Neurological:     Mental Status: He is alert and oriented to person, place, and time.  Psychiatric:        Mood and Affect: Mood normal.        Behavior: Behavior normal.        Thought Content: Thought content normal.        Judgment: Judgment normal.     ASSESSMENT/PLAN:   1. Essential hypertension Decreased clonidine to 0.1mg  QHS. Weaning off the medications. Patient to continue with other HTN medications as previously prescribed.  - Urinalysis Dipstick - cloNIDine (CATAPRES) 0.1 MG tablet; Take 1 tablet (0.1 mg total) by mouth at bedtime. X 1 week then decrease to one tablet QHS.  Dispense: 30 tablet; Refill: 1     Return in about 3 months (around 12/04/2018) for HTN.    The patient was given clear instructions to go to ER or return to medical center if symptoms do not improve, worsen or new problems develop. The patient verbalized understanding and agreed with plan of care.   Ms. Doug Sou. Nathaneil Canary, FNP-BC [facility] [address] [place], [place] [address] [phone]

## 2018-09-05 NOTE — Patient Instructions (Signed)

## 2018-09-08 ENCOUNTER — Other Ambulatory Visit: Payer: Self-pay | Admitting: Family Medicine

## 2018-09-08 DIAGNOSIS — I10 Essential (primary) hypertension: Secondary | ICD-10-CM

## 2018-09-08 MED ORDER — CLONIDINE HCL 0.1 MG PO TABS: 0.1000 mg | ORAL_TABLET | Freq: Every day | ORAL | 1 refills | Status: DC

## 2018-09-25 ENCOUNTER — Encounter: Payer: Self-pay | Admitting: Family Medicine

## 2018-12-04 ENCOUNTER — Ambulatory Visit: Payer: PRIVATE HEALTH INSURANCE | Admitting: Family Medicine

## 2019-01-01 ENCOUNTER — Telehealth: Payer: Self-pay

## 2019-01-01 NOTE — Telephone Encounter (Signed)
Called to do COVID -19 Screening. Patient preferred to have visit via telephone. Thanks!

## 2019-01-02 ENCOUNTER — Ambulatory Visit (INDEPENDENT_AMBULATORY_CARE_PROVIDER_SITE_OTHER): Payer: Self-pay | Admitting: Family Medicine

## 2019-01-02 ENCOUNTER — Encounter: Payer: Self-pay | Admitting: Family Medicine

## 2019-01-02 ENCOUNTER — Other Ambulatory Visit: Payer: Self-pay

## 2019-01-02 DIAGNOSIS — I10 Essential (primary) hypertension: Secondary | ICD-10-CM

## 2019-01-02 MED ORDER — CLONIDINE HCL 0.1 MG PO TABS: 0.0500 mg | ORAL_TABLET | Freq: Every day | ORAL | 0 refills | Status: DC

## 2019-01-02 NOTE — Progress Notes (Signed)
  Patient [facility]  Virtual Visit via Telephone Note  I connected with Padraig Nhan on 01/02/19 at  3:20 PM EDT by telephone and verified that I am speaking with the correct person using two identifiers.   I discussed the limitations, risks, security and privacy concerns of performing an evaluation and management service by telephone and the availability of in person appointments. I also discussed with the patient that there may be a patient responsible charge related to this service. The patient expressed understanding and agreed to proceed.   History of Present Illness: Edward Carr  has a past medical history of Asthma and Hypertension. He has been weaning off of clonidine. He is now taking 0.1 mg QHS. He is not monitoring his BP at home due to not having a device. He does not endorse a heart healthy diet or regular physical activity.  Observations/Objective: Patient with regular voice tone, rate and rhythm. Speaking calmly and is in no apparent distress.    Assessment and Plan: 1. Essential hypertension Continue to wean off clonidine. No other medication changes warranted at the present time.  - cloNIDine (CATAPRES) 0.1 MG tablet; Take 0.5 tablets (0.05 mg total) by mouth at bedtime. X 2 weeks. Then 1/2 pill by mouth every other night x 2 weeks then d/c  Dispense: 30 tablet; Refill: 0     Follow Up Instructions:  We discussed hand washing, using hand sanitizer when soap and water are not available, only going out when absolutely necessary, and social distancing. Explained to patient that he is immunocompromised and will need to take precautions during this time.   I discussed the assessment and treatment plan with the patient. The patient was provided an opportunity to ask questions and all were answered. The patient agreed with the plan and demonstrated an understanding of the instructions.   The patient was advised to call back or seek an  in-person evaluation if the symptoms worsen or if the condition fails to improve as anticipated.  I provided 10 minutes of non-face-to-face time during this encounter.  Ms. Andr L. Nathaneil Canary, FNP-BC [facility] [address] [place], [place] [address] [phone]

## 2019-04-08 ENCOUNTER — Encounter (HOSPITAL_COMMUNITY): Payer: Self-pay

## 2019-04-08 ENCOUNTER — Encounter (HOSPITAL_COMMUNITY): Payer: Self-pay | Admitting: *Deleted

## 2019-04-08 ENCOUNTER — Ambulatory Visit: Payer: PRIVATE HEALTH INSURANCE | Admitting: Family Medicine

## 2019-04-15 ENCOUNTER — Ambulatory Visit (INDEPENDENT_AMBULATORY_CARE_PROVIDER_SITE_OTHER): Payer: PRIVATE HEALTH INSURANCE | Admitting: Family Medicine

## 2019-04-15 ENCOUNTER — Encounter: Payer: Self-pay | Admitting: Family Medicine

## 2019-04-15 ENCOUNTER — Other Ambulatory Visit: Payer: Self-pay

## 2019-04-15 VITALS — BP 133/83 | HR 84 | Temp 98.1°F | Resp 16 | Ht 66.0 in | Wt 319.0 lb

## 2019-04-15 DIAGNOSIS — J45909 Unspecified asthma, uncomplicated: Secondary | ICD-10-CM | POA: Diagnosis not present

## 2019-04-15 DIAGNOSIS — Z6841 Body Mass Index (BMI) 40.0 and over, adult: Secondary | ICD-10-CM | POA: Diagnosis not present

## 2019-04-15 DIAGNOSIS — I10 Essential (primary) hypertension: Secondary | ICD-10-CM | POA: Diagnosis not present

## 2019-04-15 DIAGNOSIS — Z23 Encounter for immunization: Secondary | ICD-10-CM

## 2019-04-15 LAB — POCT URINALYSIS DIPSTICK
Bilirubin, UA: NEGATIVE
Blood, UA: NEGATIVE
Glucose, UA: NEGATIVE
Ketones, UA: NEGATIVE
Leukocytes, UA: NEGATIVE
Nitrite, UA: NEGATIVE
Protein, UA: NEGATIVE
Spec Grav, UA: 1.025 (ref 1.010–1.025)
Urobilinogen, UA: 0.2 E.U./dL
pH, UA: 5.5 (ref 5.0–8.0)

## 2019-04-15 NOTE — Progress Notes (Signed)
Patient [facility]   Progress Note: General Provider: Lanae Boast, FNP  SUBJECTIVE:   Edward Carr is a 43 y.o. male who  has a past medical history of Asthma and Hypertension.. Patient presents today for Hypertension and Leg Swelling (both legs )   Patient reports that he had swelling in his bilateral lower extremities a few days prior to this visit. He states that he is compliant with his medications. He does not follow a heart heatlhy low sodium or carb modified diet. He is not currently exercising on a regular basis. He states that he is weaning off of clonidine and will be completely off of it by the next visit. He denies any problems with or asthma exacerbations.    Review of Systems  Constitutional: Negative.   HENT: Negative.   Eyes: Negative.   Respiratory: Negative.   Cardiovascular: Positive for leg swelling.  Gastrointestinal: Negative.   Genitourinary: Negative.   Musculoskeletal: Negative.   Skin: Negative.   Neurological: Negative.   Psychiatric/Behavioral: Negative.      OBJECTIVE: BP 133/83 (BP Location: Left Arm, Patient Position: Sitting, Cuff Size: Large)   Pulse 84   Temp 98.1 F (36.7 C) (Oral)   Resp 16   Ht 5\' 6"  (1.676 m)   Wt (!) 319 lb (144.7 kg)   SpO2 99%   BMI 51.49 kg/m   Wt Readings from Last 3 Encounters:  04/15/19 (!) 319 lb (144.7 kg)  09/05/18 (!) 317 lb (143.8 kg)  08/22/18 (!) 318 lb (144.2 kg)     Physical Exam Vitals signs and nursing note reviewed.  Constitutional:      General: He is not in acute distress.    Appearance: Normal appearance.  HENT:     Head: Normocephalic and atraumatic.  Eyes:     Extraocular Movements: Extraocular movements intact.     Conjunctiva/sclera: Conjunctivae normal.     Pupils: Pupils are equal, round, and reactive to light.  Cardiovascular:     Rate and Rhythm: Normal rate and regular rhythm.     Heart sounds: No murmur.  Pulmonary:     Effort:  Pulmonary effort is normal.     Breath sounds: Normal breath sounds.  Musculoskeletal:        General: Swelling (non pitting edema bilateral lower extremities. ) present.  Skin:    General: Skin is warm and dry.  Neurological:     Mental Status: He is alert and oriented to person, place, and time.  Psychiatric:        Mood and Affect: Mood normal.        Behavior: Behavior normal.        Thought Content: Thought content normal.        Judgment: Judgment normal.     ASSESSMENT/PLAN:  1. Essential hypertension Discussed further cardiac testing and evaluation due to edema of lower extremities and history of hypertension. Discussed DASH diet and elevation of feet and legs when seated.  - Urinalysis Dipstick - Ambulatory referral to Cardiology - Lipid Panel - Comprehensive metabolic panel  2. Mild asthma without complication, unspecified whether persistent No medication changes warranted at the present time.    3. Morbid obesity with BMI of 45.0-49.9, adult [facility]) The patient is asked to make an attempt to improve diet and exercise patterns to aid in medical management of this problem.  - Lipid Panel - Hemoglobin A1c -pending    No follow-ups on file.    The  patient was given clear instructions to go to ER or return to medical center if symptoms do not improve, worsen or new problems develop. The patient verbalized understanding and agreed with plan of care.   Ms. Doug Sou. Nathaneil Canary, FNP-BC [facility] [address] [place], [place] [address] [phone]

## 2019-04-15 NOTE — Patient Instructions (Signed)

## 2019-04-16 ENCOUNTER — Telehealth: Payer: Self-pay

## 2019-04-16 LAB — COMPREHENSIVE METABOLIC PANEL
ALT: 23 IU/L (ref 0–44)
AST: 20 IU/L (ref 0–40)
Albumin/Globulin Ratio: 1.5 (ref 1.2–2.2)
Albumin: 4.4 g/dL (ref 4.0–5.0)
Alkaline Phosphatase: 70 IU/L (ref 39–117)
BUN/Creatinine Ratio: 9 (ref 9–20)
BUN: 10 mg/dL (ref 6–24)
Bilirubin Total: 0.6 mg/dL (ref 0.0–1.2)
CO2: 23 mmol/L (ref 20–29)
Calcium: 9.7 mg/dL (ref 8.7–10.2)
Chloride: 98 mmol/L (ref 96–106)
Creatinine, Ser: 1.09 mg/dL (ref 0.76–1.27)
GFR calc Af Amer: 98 mL/min/{1.73_m2} (ref >59)
GFR calc non Af Amer: 84 mL/min/{1.73_m2} (ref >59)
Globulin, Total: 3 g/dL (ref 1.5–4.5)
Glucose: 97 mg/dL (ref 65–99)
Potassium: 3.8 mmol/L (ref 3.5–5.2)
Sodium: 139 mmol/L (ref 134–144)
Total Protein: 7.4 g/dL (ref 6.0–8.5)

## 2019-04-16 LAB — LIPID PANEL
Chol/HDL Ratio: 4.8 ratio (ref 0.0–5.0)
Cholesterol, Total: 149 mg/dL (ref 100–199)
HDL: 31 mg/dL — ABNORMAL LOW (ref >39)
LDL Chol Calc (NIH): 92 mg/dL (ref 0–99)
Triglycerides: 146 mg/dL (ref 0–149)
VLDL Cholesterol Cal: 26 mg/dL (ref 5–40)

## 2019-04-16 LAB — HEMOGLOBIN A1C
Est. average glucose Bld gHb Est-mCnc: 140 mg/dL
Hgb A1c MFr Bld: 6.5 % — ABNORMAL HIGH (ref 4.8–5.6)

## 2019-04-16 NOTE — Telephone Encounter (Signed)
-----   Message from Lanae Boast, [place] sent at 04/16/2019  9:05 AM EDT ----- Please call patient and see if he can come in to discuss his recent lab work. His A1C was 6.5 which means he has diabetes. I think that it will be more beneficial if he has face to face encounter with a provider to discuss this within the next 1-2 weeks.

## 2019-04-16 NOTE — Telephone Encounter (Signed)
Called and spoke with patient. Advised that cholesterol was good but hgba1c was elevated. Asked that patient make an appointment to come in for this. He was transferred to the front desk for an appointment.

## 2019-04-28 ENCOUNTER — Ambulatory Visit (INDEPENDENT_AMBULATORY_CARE_PROVIDER_SITE_OTHER): Payer: PRIVATE HEALTH INSURANCE | Admitting: Family Medicine

## 2019-04-28 ENCOUNTER — Other Ambulatory Visit: Payer: Self-pay

## 2019-04-28 ENCOUNTER — Encounter: Payer: Self-pay | Admitting: Family Medicine

## 2019-04-28 VITALS — BP 138/90 | HR 81 | Temp 97.7°F | Resp 16 | Ht 66.0 in | Wt 319.0 lb

## 2019-04-28 DIAGNOSIS — R7303 Prediabetes: Secondary | ICD-10-CM

## 2019-04-28 DIAGNOSIS — G609 Hereditary and idiopathic neuropathy, unspecified: Secondary | ICD-10-CM | POA: Diagnosis not present

## 2019-04-28 DIAGNOSIS — Z6841 Body Mass Index (BMI) 40.0 and over, adult: Secondary | ICD-10-CM

## 2019-04-28 DIAGNOSIS — E119 Type 2 diabetes mellitus without complications: Secondary | ICD-10-CM

## 2019-04-28 LAB — POCT URINALYSIS DIPSTICK
Bilirubin, UA: NEGATIVE
Blood, UA: NEGATIVE
Glucose, UA: NEGATIVE
Ketones, UA: NEGATIVE
Leukocytes, UA: NEGATIVE
Nitrite, UA: NEGATIVE
Protein, UA: NEGATIVE
Spec Grav, UA: >=1.03 — AB (ref 1.010–1.025)
Urobilinogen, UA: 0.2 E.U./dL
pH, UA: 5.5 (ref 5.0–8.0)

## 2019-04-28 LAB — POCT GLYCOSYLATED HEMOGLOBIN (HGB A1C): Hemoglobin A1C: 6.5 % — AB (ref 4.0–5.6)

## 2019-04-28 MED ORDER — METFORMIN HCL 500 MG PO TABS: 500.0000 mg | ORAL_TABLET | Freq: Two times a day (BID) | ORAL | 3 refills | Status: DC

## 2019-04-28 MED ORDER — GABAPENTIN 100 MG PO CAPS: 100.0000 mg | ORAL_CAPSULE | Freq: Every day | ORAL | 3 refills | Status: DC

## 2019-04-28 NOTE — Patient Instructions (Signed)
Carbohydrate Counting for Diabetes Mellitus, Adult  Carbohydrate counting is a method of keeping track of how many carbohydrates you eat. Eating carbohydrates naturally increases the amount of sugar (glucose) in the blood. Counting how many carbohydrates you eat helps keep your blood glucose within normal limits, which helps you manage your diabetes (diabetes mellitus). It is important to know how many carbohydrates you can safely have in each meal. This is different for every person. A diet and nutrition specialist (registered dietitian) can help you make a meal plan and calculate how many carbohydrates you should have at each meal and snack. Carbohydrates are found in the following foods:  Grains, such as breads and cereals.  Dried beans and soy products.  Starchy vegetables, such as potatoes, peas, and corn.  Fruit and fruit juices.  Milk and yogurt.  Sweets and snack foods, such as cake, cookies, candy, chips, and soft drinks. How do I count carbohydrates? There are two ways to count carbohydrates in food. You can use either of the methods or a combination of both. Reading "Nutrition Facts" on packaged food The "Nutrition Facts" list is included on the labels of almost all packaged foods and beverages in the [place] It includes:  The serving size.  Information about nutrients in each serving, including the grams (g) of carbohydrate per serving. To use the "Nutrition Facts":  Decide how many servings you will have.  Multiply the number of servings by the number of carbohydrates per serving.  The resulting number is the total amount of carbohydrates that you will be having. Learning standard serving sizes of other foods When you eat carbohydrate foods that are not packaged or do not include "Nutrition Facts" on the label, you need to measure the servings in order to count the amount of carbohydrates:  Measure the foods that you will eat with a food scale or measuring cup, if needed.   Decide how many standard-size servings you will eat.  Multiply the number of servings by 15. Most carbohydrate-rich foods have about 15 g of carbohydrates per serving. ? For example, if you eat 8 oz (170 g) of strawberries, you will have eaten 2 servings and 30 g of carbohydrates (2 servings x 15 g = 30 g).  For foods that have more than one food mixed, such as soups and casseroles, you must count the carbohydrates in each food that is included. The following list contains standard serving sizes of common carbohydrate-rich foods. Each of these servings has about 15 g of carbohydrates:   hamburger bun or  English muffin.   oz (15 mL) syrup.   oz (14 g) jelly.  1 slice of bread.  1 six-inch tortilla.  3 oz (85 g) cooked rice or pasta.  4 oz (113 g) cooked dried beans.  4 oz (113 g) starchy vegetable, such as peas, corn, or potatoes.  4 oz (113 g) hot cereal.  4 oz (113 g) mashed potatoes or  of a large baked potato.  4 oz (113 g) canned or frozen fruit.  4 oz (120 mL) fruit juice.  4-6 crackers.  6 chicken nuggets.  6 oz (170 g) unsweetened dry cereal.  6 oz (170 g) plain fat-free yogurt or yogurt sweetened with artificial sweeteners.  8 oz (240 mL) milk.  8 oz (170 g) fresh fruit or one small piece of fruit.  24 oz (680 g) popped popcorn. Example of carbohydrate counting Sample meal  3 oz (85 g) chicken breast.  6 oz (170 g)   brown rice.  4 oz (113 g) corn.  8 oz (240 mL) milk.  8 oz (170 g) strawberries with sugar-free whipped topping. Carbohydrate calculation 1. Identify the foods that contain carbohydrates: ? Rice. ? Corn. ? Milk. ? Strawberries. 2. Calculate how many servings you have of each food: ? 2 servings rice. ? 1 serving corn. ? 1 serving milk. ? 1 serving strawberries. 3. Multiply each number of servings by 15 g: ? 2 servings rice x 15 g = 30 g. ? 1 serving corn x 15 g = 15 g. ? 1 serving milk x 15 g = 15 g. ? 1 serving  strawberries x 15 g = 15 g. 4. Add together all of the amounts to find the total grams of carbohydrates eaten: ? 30 g + 15 g + 15 g + 15 g = 75 g of carbohydrates total. Summary  Carbohydrate counting is a method of keeping track of how many carbohydrates you eat.  Eating carbohydrates naturally increases the amount of sugar (glucose) in the blood.  Counting how many carbohydrates you eat helps keep your blood glucose within normal limits, which helps you manage your diabetes.  A diet and nutrition specialist (registered dietitian) can help you make a meal plan and calculate how many carbohydrates you should have at each meal and snack. This information is not intended to replace advice given to you by your health care provider. Make sure you discuss any questions you have with your health care provider. Document Released: 07/16/2005 Document Revised: 02/07/2017 Document Reviewed: 12/28/2015 Elsevier Patient Education  2020 [employer]. Diabetes Mellitus and Exercise Exercising regularly is important for your overall health, especially when you have diabetes (diabetes mellitus). Exercising is not only about losing weight. It has many other health benefits, such as increasing muscle strength and bone density and reducing body fat and stress. This leads to improved fitness, flexibility, and endurance, all of which result in better overall health. Exercise has additional benefits for people with diabetes, including:  Reducing appetite.  Helping to lower and control blood glucose.  Lowering blood pressure.  Helping to control amounts of fatty substances (lipids) in the blood, such as cholesterol and triglycerides.  Helping the body to respond better to insulin (improving insulin sensitivity).  Reducing how much insulin the body needs.  Decreasing the risk for heart disease by: ? Lowering cholesterol and triglyceride levels. ? Increasing the levels of good cholesterol. ? Lowering blood  glucose levels. What is my activity plan? Your health care provider or certified diabetes educator can help you make a plan for the type and frequency of exercise (activity plan) that works for you. Make sure that you:  Do at least 150 minutes of moderate-intensity or vigorous-intensity exercise each week. This could be brisk walking, biking, or water aerobics. ? Do stretching and strength exercises, such as yoga or weightlifting, at least 2 times a week. ? Spread out your activity over at least 3 days of the week.  Get some form of physical activity every day. ? Do not go more than 2 days in a row without some kind of physical activity. ? Avoid being inactive for more than 30 minutes at a time. Take frequent breaks to walk or stretch.  Choose a type of exercise or activity that you enjoy, and set realistic goals.  Start slowly, and gradually increase the intensity of your exercise over time. What do I need to know about managing my diabetes?   Check your blood glucose  before and after exercising. ? If your blood glucose is 240 mg/dL (13.3 mmol/L) or higher before you exercise, check your urine for ketones. If you have ketones in your urine, do not exercise until your blood glucose returns to normal. ? If your blood glucose is 100 mg/dL (5.6 mmol/L) or lower, eat a snack containing 15-20 grams of carbohydrate. Check your blood glucose 15 minutes after the snack to make sure that your level is above 100 mg/dL (5.6 mmol/L) before you start your exercise.  Know the symptoms of low blood glucose (hypoglycemia) and how to treat it. Your risk for hypoglycemia increases during and after exercise. Common symptoms of hypoglycemia can include: ? Hunger. ? Anxiety. ? Sweating and feeling clammy. ? Confusion. ? Dizziness or feeling light-headed. ? Increased heart rate or palpitations. ? Blurry vision. ? Tingling or numbness around the mouth, lips, or tongue. ? Tremors or shakes. ? Irritability.   Keep a rapid-acting carbohydrate snack available before, during, and after exercise to help prevent or treat hypoglycemia.  Avoid injecting insulin into areas of the body that are going to be exercised. For example, avoid injecting insulin into: ? The arms, when playing tennis. ? The legs, when jogging.  Keep records of your exercise habits. Doing this can help you and your health care provider adjust your diabetes management plan as needed. Write down: ? Food that you eat before and after you exercise. ? Blood glucose levels before and after you exercise. ? The type and amount of exercise you have done. ? When your insulin is expected to peak, if you use insulin. Avoid exercising at times when your insulin is peaking.  When you start a new exercise or activity, work with your health care provider to make sure the activity is safe for you, and to adjust your insulin, medicines, or food intake as needed.  Drink plenty of water while you exercise to prevent dehydration or heat stroke. Drink enough fluid to keep your urine clear or pale yellow. Summary  Exercising regularly is important for your overall health, especially when you have diabetes (diabetes mellitus).  Exercising has many health benefits, such as increasing muscle strength and bone density and reducing body fat and stress.  Your health care provider or certified diabetes educator can help you make a plan for the type and frequency of exercise (activity plan) that works for you.  When you start a new exercise or activity, work with your health care provider to make sure the activity is safe for you, and to adjust your insulin, medicines, or food intake as needed. This information is not intended to replace advice given to you by your health care provider. Make sure you discuss any questions you have with your health care provider. Document Released: 10/06/2003 Document Revised: 02/07/2017 Document Reviewed: 12/26/2015 Elsevier  Patient Education  2020 [employer]. Diabetes Basics  Diabetes (diabetes mellitus) is a long-term (chronic) disease. It occurs when the body does not properly use sugar (glucose) that is released from food after you eat. Diabetes may be caused by one or both of these problems:  Your pancreas does not make enough of a hormone called insulin.  Your body does not react in a normal way to insulin that it makes. Insulin lets sugars (glucose) go into cells in your body. This gives you energy. If you have diabetes, sugars cannot get into cells. This causes high blood sugar (hyperglycemia). Follow these instructions at home: How is diabetes treated? You may need to  take insulin or other diabetes medicines daily to keep your blood sugar in balance. Take your diabetes medicines every day as told by your doctor. List your diabetes medicines here: Diabetes medicines  Name of medicine: ______________________________ ? Amount (dose): _______________ Time (a.m./p.m.): _______________ Notes: ___________________________________  Name of medicine: ______________________________ ? Amount (dose): _______________ Time (a.m./p.m.): _______________ Notes: ___________________________________  Name of medicine: ______________________________ ? Amount (dose): _______________ Time (a.m./p.m.): _______________ Notes: ___________________________________ If you use insulin, you will learn how to give yourself insulin by injection. You may need to adjust the amount based on the food that you eat. List the types of insulin you use here: Insulin  Insulin type: ______________________________ ? Amount (dose): _______________ Time (a.m./p.m.): _______________ Notes: ___________________________________  Insulin type: ______________________________ ? Amount (dose): _______________ Time (a.m./p.m.): _______________ Notes: ___________________________________  Insulin type: ______________________________ ? Amount (dose):  _______________ Time (a.m./p.m.): _______________ Notes: ___________________________________  Insulin type: ______________________________ ? Amount (dose): _______________ Time (a.m./p.m.): _______________ Notes: ___________________________________  Insulin type: ______________________________ ? Amount (dose): _______________ Time (a.m./p.m.): _______________ Notes: ___________________________________ How do I manage my blood sugar?  Check your blood sugar levels using a blood glucose monitor as directed by your doctor. Your doctor will set treatment goals for you. Generally, you should have these blood sugar levels:  Before meals (preprandial): 80-130 mg/dL (4.4-7.2 mmol/L).  After meals (postprandial): below 180 mg/dL (10 mmol/L).  A1c level: less than 7%. Write down the times that you will check your blood sugar levels: Blood sugar checks  Time: _______________ Notes: ___________________________________  Time: _______________ Notes: ___________________________________  Time: _______________ Notes: ___________________________________  Time: _______________ Notes: ___________________________________  Time: _______________ Notes: ___________________________________  Time: _______________ Notes: ___________________________________  What do I need to know about low blood sugar? Low blood sugar is called hypoglycemia. This is when blood sugar is at or below 70 mg/dL (3.9 mmol/L). Symptoms may include:  Feeling: ? Hungry. ? Worried or nervous (anxious). ? Sweaty and clammy. ? Confused. ? Dizzy. ? Sleepy. ? Sick to your stomach (nauseous).  Having: ? A fast heartbeat. ? A headache. ? A change in your vision. ? Tingling or no feeling (numbness) around the mouth, lips, or tongue. ? Jerky movements that you cannot control (seizure).  Having trouble with: ? Moving (coordination). ? Sleeping. ? Passing out (fainting). ? Getting upset easily (irritability). Treating low  blood sugar To treat low blood sugar, eat or drink something sugary right away. If you can think clearly and swallow safely, follow the 15:15 rule:  Take 15 grams of a fast-acting carb (carbohydrate). Talk with your doctor about how much you should take.  Some fast-acting carbs are: ? Sugar tablets (glucose pills). Take 3-4 glucose pills. ? 6-8 pieces of hard candy. ? 4-6 oz (120-150 mL) of fruit juice. ? 4-6 oz (120-150 mL) of regular (not diet) soda. ? 1 Tbsp (15 mL) honey or sugar.  Check your blood sugar 15 minutes after you take the carb.  If your blood sugar is still at or below 70 mg/dL (3.9 mmol/L), take 15 grams of a carb again.  If your blood sugar does not go above 70 mg/dL (3.9 mmol/L) after 3 tries, get help right away.  After your blood sugar goes back to normal, eat a meal or a snack within 1 hour. Treating very low blood sugar If your blood sugar is at or below 54 mg/dL (3 mmol/L), you have very low blood sugar (severe hypoglycemia). This is an emergency. Do not wait to see if the symptoms will go away.  Get medical help right away. Call your local emergency services (911 in the [place] Do not drive yourself to the hospital. Questions to ask your health care provider  Do I need to meet with a diabetes educator?  What equipment will I need to care for myself at home?  What diabetes medicines do I need? When should I take them?  How often do I need to check my blood sugar?  What number can I call if I have questions?  When is my next doctor's visit?  Where can I find a support group for people with diabetes? Where to find more information  American Diabetes Association: www.diabetes.org  American Association of Diabetes Educators: www.diabeteseducator.org/patient-resources Contact a doctor if:  Your blood sugar is at or above 240 mg/dL (13.3 mmol/L) for 2 days in a row.  You have been sick or have had a fever for 2 days or more, and you are not getting better.   You have any of these problems for more than 6 hours: ? You cannot eat or drink. ? You feel sick to your stomach (nauseous). ? You throw up (vomit). ? You have watery poop (diarrhea). Get help right away if:  Your blood sugar is lower than 54 mg/dL (3 mmol/L).  You get confused.  You have trouble: ? Thinking clearly. ? Breathing. Summary  Diabetes (diabetes mellitus) is a long-term (chronic) disease. It occurs when the body does not properly use sugar (glucose) that is released from food after digestion.  Take insulin and diabetes medicines as told.  Check your blood sugar every day, as often as told.  Keep all follow-up visits as told by your doctor. This is important. This information is not intended to replace advice given to you by your health care provider. Make sure you discuss any questions you have with your health care provider. Document Released: 10/18/2017 Document Revised: 09/05/2018 Document Reviewed: 10/18/2017 Elsevier Patient Education  2020 [employer].

## 2019-04-28 NOTE — Progress Notes (Signed)
Established Patient Office Visit  Subjective:  Patient ID: Edward Carr, male    DOB: 08-Sep-1975  Age: 43 y.o. MRN: [id number]  CC:  Chief Complaint  Patient presents with  . Diabetes    new dx.     HPI Edward Carr a 43 year old male with a medical history significant for essential hypertension, morbid obesity, and mild intermittent asthma presents for new diagnosis of type 2 diabetes mellitus. Patient was seen by PCP on 04/15/2019 and his hemoglobin a1C had increased to 6.5, which is consistent with type 2 diabetes mellitus. Patient says that he has a family history of DMII and hypertension. Patient endorses numbness and tingling to left lower extremity. His symptoms have included polydipsia. He denies dizziness, blurred vision, polyphasia, polyuria, or foot ulcerations.   Edward Carr also has a history of essential hypertension. He has been taking all medications consistently and blood pressure is controlled at home. He says that he typically eats 1-2 large meals throughout the day. He does not drink much water. Patient does not exercise consistently and is mostly sedentary, which increases his cardiac risk factors. He is a non smoker.     Past Medical History:  Diagnosis Date  . Asthma   . Hypertension     Past Surgical History:  Procedure Laterality Date  . DENTAL SURGERY      Family History  Problem Relation Age of Onset  . Diabetes Mother   . Cancer Father   . Cancer Sister     Social History   Socioeconomic History  . Marital status: Single    Spouse name: Not on file  . Number of children: Not on file  . Years of education: Not on file  . Highest education level: Not on file  Occupational History  . Not on file  Social Needs  . Financial resource strain: Not on file  . Food insecurity    Worry: Not on file    Inability: Not on file  . Transportation needs    Medical: Not on file    Non-medical: Not on file  Tobacco Use  . Smoking status: Never Smoker   . Smokeless tobacco: Never Used  Substance and Sexual Activity  . Alcohol use: No  . Drug use: No  . Sexual activity: Never  Lifestyle  . Physical activity    Days per week: Not on file    Minutes per session: Not on file  . Stress: Not on file  Relationships  . Social Herbalist on phone: Not on file    Gets together: Not on file    Attends religious service: Not on file    Active member of club or organization: Not on file    Attends meetings of clubs or organizations: Not on file    Relationship status: Not on file  . Intimate partner violence    Fear of current or ex partner: Not on file    Emotionally abused: Not on file    Physically abused: Not on file    Forced sexual activity: Not on file  Other Topics Concern  . Not on file  Social History Narrative  . Not on file    Outpatient Medications Prior to Visit  Medication Sig Dispense Refill  . albuterol (PROVENTIL HFA;VENTOLIN HFA) 108 (90 Base) MCG/ACT inhaler Inhale 1 puff into the lungs every 6 (six) hours as needed for wheezing or shortness of breath. 1 Inhaler 1  . amLODipine (NORVASC) 10 MG  tablet Take 1 tablet (10 mg total) by mouth daily. 90 tablet 1  . cloNIDine (CATAPRES) 0.1 MG tablet Take 0.5 tablets (0.05 mg total) by mouth at bedtime. X 2 weeks. Then 1/2 pill by mouth every other night x 2 weeks then d/c 30 tablet 0  . lisinopril-hydrochlorothiazide (PRINZIDE,ZESTORETIC) 20-25 MG tablet Take 1 tablet by mouth daily. 90 tablet 1   No facility-administered medications prior to visit.     No Known Allergies  ROS Review of Systems  Constitutional: Negative for fatigue.  HENT: Negative.   Eyes: Negative.   Respiratory: Negative.   Cardiovascular: Negative.   Gastrointestinal: Negative.   Endocrine: Positive for polydipsia. Negative for polyphagia and polyuria.  Genitourinary: Negative.   Musculoskeletal: Negative.   Neurological: Positive for numbness.  Hematological: Negative.    Psychiatric/Behavioral: Negative.       Objective:    Physical Exam  Constitutional: He is oriented to person, place, and time. He appears well-developed and well-nourished.  Morbid obesity  Eyes: Pupils are equal, round, and reactive to light.  Neck: Normal range of motion.  Cardiovascular: Normal rate and regular rhythm.  Pulmonary/Chest: Effort normal.  Abdominal: Soft. Bowel sounds are normal.  Musculoskeletal: Normal range of motion.  Neurological: He is alert and oriented to person, place, and time.  Skin: Skin is warm.  Psychiatric: He has a normal mood and affect. His behavior is normal. Judgment and thought content normal.    BP 138/90 (BP Location: Right Arm, Patient Position: Sitting, Cuff Size: Large)   Pulse 81   Temp 97.7 F (36.5 C) (Oral)   Resp 16   Ht 5\' 6"  (1.676 m)   Wt (!) 319 lb (144.7 kg)   SpO2 98%   BMI 51.49 kg/m  Wt Readings from Last 3 Encounters:  04/28/19 (!) 319 lb (144.7 kg)  04/15/19 (!) 319 lb (144.7 kg)  09/05/18 (!) 317 lb (143.8 kg)     There are no preventive care reminders to display for this patient.  There are no preventive care reminders to display for this patient.  Lab Results  Component Value Date   TSH 2.90 08/22/2016   Lab Results  Component Value Date   WBC 8.6 08/22/2016   HGB 13.4 08/22/2016   HCT 40.7 08/22/2016   MCV 82.1 08/22/2016   PLT 323 08/22/2016   Lab Results  Component Value Date   NA 139 04/15/2019   K 3.8 04/15/2019   CO2 23 04/15/2019   GLUCOSE 97 04/15/2019   BUN 10 04/15/2019   CREATININE 1.09 04/15/2019   BILITOT 0.6 04/15/2019   ALKPHOS 70 04/15/2019   AST 20 04/15/2019   ALT 23 04/15/2019   PROT 7.4 04/15/2019   ALBUMIN 4.4 04/15/2019   CALCIUM 9.7 04/15/2019   Lab Results  Component Value Date   CHOL 149 04/15/2019   Lab Results  Component Value Date   HDL 31 (L) 04/15/2019   Lab Results  Component Value Date   LDLCALC 92 04/15/2019   Lab Results  Component Value  Date   TRIG 146 04/15/2019   Lab Results  Component Value Date   CHOLHDL 4.8 04/15/2019   Lab Results  Component Value Date   HGBA1C 6.5 (A) 04/28/2019      Assessment & Plan:   Problem List Items Addressed This Visit    None    Visit Diagnoses    Prediabetes    -  Primary   Relevant Orders   HgB A1c (Completed)  Type 2 diabetes mellitus not at goal [facility])       Relevant Medications   metFORMIN (GLUCOPHAGE) 500 MG tablet   Other Relevant Orders   Microalbumin/Creatinine Ratio, Urine   Ambulatory referral to Ophthalmology   Ambulatory referral to diabetic education   Idiopathic peripheral neuropathy       Relevant Medications   gabapentin (NEURONTIN) 100 MG capsule      Meds ordered this encounter  Medications  . gabapentin (NEURONTIN) 100 MG capsule    Sig: Take 1 capsule (100 mg total) by mouth at bedtime.    Dispense:  30 capsule    Refill:  3    Order Specific Question:   Supervising Provider    Answer:   Tresa Garter [id number]  . metFORMIN (GLUCOPHAGE) 500 MG tablet    Sig: Take 1 tablet (500 mg total) by mouth 2 (two) times daily with a meal.    Dispense:  60 tablet    Refill:  3    Order Specific Question:   Supervising Provider    Answer:   Tresa Garter [id number]   Type 2 diabetes mellitus not at goal [facility]) Patient and I discussed DMII at length. We discussed diet and exercise. Patient provided with written information.  Edward Carr and I set a goal to decrease hemoglobin a1C over the next 3 months and to reduce body weight by 5%.  - HgB A1c - Microalbumin/Creatinine Ratio, Urine - Ambulatory referral to Ophthalmology - metFORMIN (GLUCOPHAGE) 500 MG tablet; Take 1 tablet (500 mg total) by mouth 2 (two) times daily with a meal.  Dispense: 60 tablet; Refill: 3 - Ambulatory referral to diabetic education - Urinalysis Dipstick  Idiopathic peripheral neuropathy Will start a trial of gabapentin. Patient will follow up in 1 month. -  gabapentin (NEURONTIN) 100 MG capsule; Take 1 capsule (100 mg total) by mouth at bedtime.  Dispense: 30 capsule; Refill: 3  Morbid obesity with BMI of 50.0-59.9, adult [facility]) The patient is asked to make an attempt to improve diet and exercise patterns to aid in medical management of this problem.  Follow-up: Return in about 1 month (around 05/28/2019) for diabetes.    [name]  APRN, MSN, FNP-C [facility] [address] [place], [place] [address] [phone]

## 2019-04-29 LAB — MICROALBUMIN / CREATININE URINE RATIO
Creatinine, Urine: 320.5 mg/dL
Microalb/Creat Ratio: 6 mg/g creat (ref 0–29)
Microalbumin, Urine: 20.4 ug/mL

## 2019-05-06 ENCOUNTER — Other Ambulatory Visit: Payer: Self-pay

## 2019-05-06 ENCOUNTER — Telehealth: Payer: Self-pay

## 2019-05-06 ENCOUNTER — Encounter: Payer: Self-pay | Admitting: Cardiology

## 2019-05-06 ENCOUNTER — Ambulatory Visit (INDEPENDENT_AMBULATORY_CARE_PROVIDER_SITE_OTHER): Payer: PRIVATE HEALTH INSURANCE | Admitting: Cardiology

## 2019-05-06 VITALS — BP 156/120 | HR 79 | Temp 97.3°F | Ht 66.0 in | Wt 316.0 lb

## 2019-05-06 DIAGNOSIS — R0602 Shortness of breath: Secondary | ICD-10-CM

## 2019-05-06 DIAGNOSIS — E119 Type 2 diabetes mellitus without complications: Secondary | ICD-10-CM

## 2019-05-06 DIAGNOSIS — I10 Essential (primary) hypertension: Secondary | ICD-10-CM

## 2019-05-06 DIAGNOSIS — R600 Localized edema: Secondary | ICD-10-CM

## 2019-05-06 NOTE — Progress Notes (Signed)
Cardiology Office Note:    Date:  05/06/2019   ID:  Edward Carr, DOB 11/19/1975, MRN [id number]  PCP:  Lanae Boast, [place]  Cardiologist:  No primary care provider on file.  Electrophysiologist:  None   Referring MD: Lanae Boast, FNP   Chief Complaint  Patient presents with  . New Patient (Initial Visit)  . Edema    Legs, feet, and ankles.    History of Present Illness:    Edward Carr is a 43 y.o. male with a hx of asthma, hypertension, morbid obesity who is referred by Lanae Boast, FNP for evaluation of lower extremity edema.  Reports lower extremity edema has been present for about 7 months.  Has been improving with decreasing his salt intake.  Does report intermittent dyspnea which he believes is related to his asthma and improves with using his inhaler.  Does get short of breath with exertion.  He can walk about 5 stairs without stopping.  Denies any chest pain or palpitations.  He was seen by his PCP and labs were ordered which were notable for new diagnosis of diabetes (A1c 6.5).  He was started on metformin.  Renal function and albumin were normal.  For his hypertension, he is on amlodipine, clonidine, lisinopril-hydrochlorothiazide.  He was supposed be weaning off his clonidine but states that he has  instead been taking his normal dose.  Past Medical History:  Diagnosis Date  . Asthma   . Hypertension     Past Surgical History:  Procedure Laterality Date  . DENTAL SURGERY      Current Medications: Current Meds  Medication Sig  . albuterol (PROVENTIL HFA;VENTOLIN HFA) 108 (90 Base) MCG/ACT inhaler Inhale 1 puff into the lungs every 6 (six) hours as needed for wheezing or shortness of breath.  Marland Kitchen amLODipine (NORVASC) 10 MG tablet Take 1 tablet (10 mg total) by mouth daily.  . cloNIDine (CATAPRES) 0.1 MG tablet Take 0.5 tablets (0.05 mg total) by mouth at bedtime. X 2 weeks. Then 1/2 pill by mouth every other night x 2 weeks then d/c  . gabapentin (NEURONTIN) 100  MG capsule Take 1 capsule (100 mg total) by mouth at bedtime.  Marland Kitchen lisinopril-hydrochlorothiazide (PRINZIDE,ZESTORETIC) 20-25 MG tablet Take 1 tablet by mouth daily.  . metFORMIN (GLUCOPHAGE) 500 MG tablet Take 1 tablet (500 mg total) by mouth 2 (two) times daily with a meal.     Allergies:   Patient has no known allergies.   Social History   Socioeconomic History  . Marital status: Single    Spouse name: Not on file  . Number of children: Not on file  . Years of education: Not on file  . Highest education level: Not on file  Occupational History  . Not on file  Social Needs  . Financial resource strain: Not on file  . Food insecurity    Worry: Not on file    Inability: Not on file  . Transportation needs    Medical: Not on file    Non-medical: Not on file  Tobacco Use  . Smoking status: Never Smoker  . Smokeless tobacco: Never Used  Substance and Sexual Activity  . Alcohol use: No  . Drug use: No  . Sexual activity: Never  Lifestyle  . Physical activity    Days per week: Not on file    Minutes per session: Not on file  . Stress: Not on file  Relationships  . Social connections    Talks on phone: Not on file  Gets together: Not on file    Attends religious service: Not on file    Active member of club or organization: Not on file    Attends meetings of clubs or organizations: Not on file    Relationship status: Not on file  Other Topics Concern  . Not on file  Social History Narrative  . Not on file     Family History: The patient's family history includes Cancer in his father and sister; Diabetes in his mother.  ROS:   Please see the history of present illness.    All other systems reviewed and are negative.  EKGs/Labs/Other Studies Reviewed:    The following studies were reviewed today:   EKG:  EKG is ordered today.  The ekg ordered today demonstrates normal sinus rhythm, rate 79, LVH required that  Recent Labs: 04/15/2019: ALT 23; BUN 10;  Creatinine, Ser 1.09; Potassium 3.8; Sodium 139  Recent Lipid Panel    Component Value Date/Time   CHOL 149 04/15/2019 1452   TRIG 146 04/15/2019 1452   HDL 31 (L) 04/15/2019 1452   CHOLHDL 4.8 04/15/2019 1452   CHOLHDL 3.8 08/22/2016 0932   VLDL 21 08/22/2016 0932   LDLCALC 92 04/15/2019 1452    Physical Exam:    VS:  BP (!) 156/120 (BP Location: Right Arm, Patient Position: Sitting, Cuff Size: Large)   Pulse 79   Temp (!) 97.3 F (36.3 C)   Ht 5\' 6"  (1.676 m)   Wt (!) 316 lb (143.3 kg)   BMI 51.00 kg/m     Wt Readings from Last 3 Encounters:  05/06/19 (!) 316 lb (143.3 kg)  04/28/19 (!) 319 lb (144.7 kg)  04/15/19 (!) 319 lb (144.7 kg)     GEN: obese, in no acute distress HEENT: Normal NECK: No JVD; No carotid bruits LYMPHATICS: No lymphadenopathy CARDIAC: RRR, no murmurs, rubs, gallops RESPIRATORY:  Clear to auscultation without rales, wheezing or rhonchi  ABDOMEN: Soft, non-tender, non-distended MUSCULOSKELETAL:  1+ BLE edema; No deformity  SKIN: Warm and dry NEUROLOGIC:  Alert and oriented x 3 PSYCHIATRIC:  Normal affect   ASSESSMENT:    1. Bilateral leg edema   2. SOB (shortness of breath)   3. Essential hypertension   4. Type 2 diabetes mellitus without complication, without long-term current use of insulin (HCC)    PLAN:    In order of problems listed above:  Lower extremity edema: unclear etiology.  Normal albumin and renal function.  Could be related to venous insufficiency or medication use (amlodipine).  Will check TTE and BNP to evaluate for cardiac etiology.  Will check TSH.   Dyspnea: Likely related to asthma and deconditioning.  Checking TTE/BNP as above to evaluate for cardiac etiology.  Hypertension: On amlodipine 10 mg, lisinopril-hydrochlorothiazide 20-25 mg; clonidine 0.1 mg.  BP elevated in clinic today but reports did not take his medications this morning.  Asked to check BP daily and call in next 2 weeks with results  Diabetes: New  diagnosis, A1c 6.5.  Started on metformin    Medication Adjustments/Labs and Tests Ordered: Current medicines are reviewed at length with the patient today.  Concerns regarding medicines are outlined above.  Orders Placed This Encounter  Procedures  . B Nat Peptide  . EKG 12-Lead  . ECHOCARDIOGRAM COMPLETE   No orders of the defined types were placed in this encounter.   Patient Instructions  Medication Instructions:  Continue same medications   Lab work: BNP today   Testing/Procedures: Schedule  Echo  Follow-Up: At [facility], you and your health needs are our priority.  As part of our continuing mission to provide you with exceptional heart care, we have created designated Provider Care Teams.  These Care Teams include your primary Cardiologist (physician) and Advanced Practice Providers (APPs -  Physician Assistants and Nurse Practitioners) who all work together to provide you with the care you need, when you need it. . Schedule follow up appointment after test  Check blood pressure daily   Call office in 2 weeks to report readings      Signed, Donato Heinz, MD  05/06/2019 4:50 PM    [place]

## 2019-05-06 NOTE — Telephone Encounter (Signed)
Spoke to patient Dr.Schumann advised needs TSH.Advised he can have done Monday 10/12 before his echo.Patient stated he will have done then.Orders placed and scheduled for lab appt at our [facility] office.

## 2019-05-06 NOTE — Patient Instructions (Signed)
Medication Instructions:  Continue same medications   Lab work: BNP today   Testing/Procedures: Schedule Echo  Follow-Up: At [facility], you and your health needs are our priority.  As part of our continuing mission to provide you with exceptional heart care, we have created designated Provider Care Teams.  These Care Teams include your primary Cardiologist (physician) and Advanced Practice Providers (APPs -  Physician Assistants and Nurse Practitioners) who all work together to provide you with the care you need, when you need it. . Schedule follow up appointment after test  Check blood pressure daily   Call office in 2 weeks to report readings

## 2019-05-06 NOTE — Addendum Note (Signed)
Addended by: Kathyrn Lass on: 05/06/2019 05:35 PM   Modules accepted: Orders

## 2019-05-07 LAB — BRAIN NATRIURETIC PEPTIDE: BNP: 8.2 pg/mL (ref 0.0–100.0)

## 2019-05-11 ENCOUNTER — Other Ambulatory Visit: Payer: PRIVATE HEALTH INSURANCE | Admitting: *Deleted

## 2019-05-11 ENCOUNTER — Other Ambulatory Visit: Payer: Self-pay

## 2019-05-11 ENCOUNTER — Ambulatory Visit (HOSPITAL_COMMUNITY): Payer: PRIVATE HEALTH INSURANCE | Attending: Cardiology

## 2019-05-11 DIAGNOSIS — R0602 Shortness of breath: Secondary | ICD-10-CM | POA: Diagnosis not present

## 2019-05-11 DIAGNOSIS — I10 Essential (primary) hypertension: Secondary | ICD-10-CM

## 2019-05-11 DIAGNOSIS — R600 Localized edema: Secondary | ICD-10-CM

## 2019-05-11 DIAGNOSIS — E119 Type 2 diabetes mellitus without complications: Secondary | ICD-10-CM

## 2019-05-11 LAB — TSH: TSH: 4.11 u[IU]/mL (ref 0.450–4.500)

## 2019-05-24 NOTE — Progress Notes (Signed)
Cardiology Office Note:    Date:  05/25/2019   ID:  Edward Carr, DOB 11/07/1975, MRN [id number]  PCP:  Lanae Boast, [place]  Cardiologist:  Donato Heinz, MD  Electrophysiologist:  None   Referring MD: Lanae Boast, [place]   Chief Complaint  Patient presents with  . Shortness of Breath    History of Present Illness:    Edward Carr is a 43 y.o. male with a hx of asthma, hypertension, morbid obesity who presents for follow-up of lower extremity edema and dyspnea.  He was seen initially on 05/06/2019.  He had been reporting lower extremity edema for about 7 months.  Labs showed normal BNP, TSH, renal function, albumin.  Also reported that he gets short of breath with exertion.  Denies any chest pain or palpitations.  He was seen by his PCP and labs were ordered which were notable for new diagnosis of diabetes (A1c 6.5).  He was started on metformin.  Renal function and albumin were normal.  For his hypertension, he is on amlodipine, clonidine, lisinopril-hydrochlorothiazide.  He was supposed be weaning off his clonidine but states that he has  instead been taking his normal dose.  After clinic visit on 05/06/2019, a BMP was checked and was normal.  TTE showed normal systolic function, normal diastolic function, no significant valvular disease.  Reports LE edema has improved.  Reports dyspnea has improved.  Has not been chckeing BP at home.    Past Medical History:  Diagnosis Date  . Asthma   . Hypertension     Past Surgical History:  Procedure Laterality Date  . DENTAL SURGERY      Current Medications: Current Meds  Medication Sig  . albuterol (PROVENTIL HFA;VENTOLIN HFA) 108 (90 Base) MCG/ACT inhaler Inhale 1 puff into the lungs every 6 (six) hours as needed for wheezing or shortness of breath.  Marland Kitchen amLODipine (NORVASC) 10 MG tablet Take 1 tablet (10 mg total) by mouth daily.  . cloNIDine (CATAPRES) 0.1 MG tablet Take 0.5 tablets (0.05 mg total) by mouth at bedtime. X 2  weeks. Then 1/2 pill by mouth every other night x 2 weeks then d/c  . gabapentin (NEURONTIN) 100 MG capsule Take 1 capsule (100 mg total) by mouth at bedtime.  Marland Kitchen lisinopril-hydrochlorothiazide (PRINZIDE,ZESTORETIC) 20-25 MG tablet Take 1 tablet by mouth daily.  . metFORMIN (GLUCOPHAGE) 500 MG tablet Take 1 tablet (500 mg total) by mouth 2 (two) times daily with a meal.     Allergies:   Patient has no known allergies.   Social History   Socioeconomic History  . Marital status: Single    Spouse name: Not on file  . Number of children: Not on file  . Years of education: Not on file  . Highest education level: Not on file  Occupational History  . Not on file  Social Needs  . Financial resource strain: Not on file  . Food insecurity    Worry: Not on file    Inability: Not on file  . Transportation needs    Medical: Not on file    Non-medical: Not on file  Tobacco Use  . Smoking status: Never Smoker  . Smokeless tobacco: Never Used  Substance and Sexual Activity  . Alcohol use: No  . Drug use: No  . Sexual activity: Never  Lifestyle  . Physical activity    Days per week: Not on file    Minutes per session: Not on file  . Stress: Not on file  Relationships  .  Social Herbalist on phone: Not on file    Gets together: Not on file    Attends religious service: Not on file    Active member of club or organization: Not on file    Attends meetings of clubs or organizations: Not on file    Relationship status: Not on file  Other Topics Concern  . Not on file  Social History Narrative  . Not on file     Family History: The patient's family history includes Cancer in his father and sister; Diabetes in his mother.  ROS:   Please see the history of present illness.    All other systems reviewed and are negative.  EKGs/Labs/Other Studies Reviewed:    The following studies were reviewed today:   EKG:  EKG is not ordered today.  The last EKG demonstrates normal  sinus rhythm, rate 79, LVH   TTE 05/11/19:  1. Left ventricular ejection fraction, by visual estimation, is 55 to 60%. The left ventricle has normal function. Normal left ventricular size. There is no left ventricular hypertrophy.  2. Global right ventricle has normal systolic function.The right ventricular size is normal. No increase in right ventricular wall thickness.  3. Left atrial size was normal.  4. Right atrial size was normal.  5. The mitral valve is normal in structure. Trace mitral valve regurgitation. No evidence of mitral stenosis.  6. The tricuspid valve is normal in structure. Tricuspid valve regurgitation is trivial.  7. The aortic valve is normal in structure. Aortic valve regurgitation was not visualized by color flow Doppler. Mild aortic valve sclerosis without stenosis.  8. The pulmonic valve was normal in structure. Pulmonic valve regurgitation is trivial by color flow Doppler.  9. Mildly elevated pulmonary artery systolic pressure. 10. The inferior vena cava is normal in size with greater than 50% respiratory variability, suggesting right atrial pressure of 3 mmHg. 11. Trivial pericardial effusion is present. 12. The pericardial effusion is posterior to the left ventricle.   Recent Labs: 04/15/2019: ALT 23; BUN 10; Creatinine, Ser 1.09; Potassium 3.8; Sodium 139 05/06/2019: BNP 8.2 05/11/2019: TSH 4.110  Recent Lipid Panel    Component Value Date/Time   CHOL 149 04/15/2019 1452   TRIG 146 04/15/2019 1452   HDL 31 (L) 04/15/2019 1452   CHOLHDL 4.8 04/15/2019 1452   CHOLHDL 3.8 08/22/2016 0932   VLDL 21 08/22/2016 0932   LDLCALC 92 04/15/2019 1452    Physical Exam:    VS:  BP (!) 142/91 (BP Location: Right Arm, Patient Position: Sitting)   Pulse 96   Temp (!) 97.1 F (36.2 C)   Ht 5\' 6"  (1.676 m)   Wt (!) 323 lb 9.6 oz (146.8 kg)   SpO2 95%   BMI 52.23 kg/m     Wt Readings from Last 3 Encounters:  05/25/19 (!) 323 lb 9.6 oz (146.8 kg)  05/06/19 (!)  316 lb (143.3 kg)  04/28/19 (!) 319 lb (144.7 kg)     GEN: obese, in no acute distress HEENT: Normal NECK: No JVD; No carotid bruits LYMPHATICS: No lymphadenopathy CARDIAC: RRR, no murmurs, rubs, gallops RESPIRATORY:  Clear to auscultation without rales, wheezing or rhonchi  ABDOMEN: Soft, non-tender, non-distended MUSCULOSKELETAL:  1+ BLE edema; No deformity  SKIN: Warm and dry NEUROLOGIC:  Alert and oriented x 3 PSYCHIATRIC:  Normal affect   ASSESSMENT:    1. Bilateral leg edema   2. SOB (shortness of breath)   3. Essential hypertension   4.  Snoring    PLAN:    In order of problems listed above:  Lower extremity edema: unclear etiology.  Normal albumin, TSH, renal function.  Could be related to venous insufficiency or medication use (amlodipine).  No evidence of cardiac etiology, with TTE showing normal sytolic/diastolic function, no significant valvular abnormalities and BNP is normal.  Dyspnea: Likely related to asthma and deconditioning.  Normal TTE and BNP argues against cardiac etiology  Hypertension: On amlodipine 10 mg, lisinopril-hydrochlorothiazide 20-25 mg; clonidine 0.1 mg.  Suspect that untreated OSA may be contributing to hypertension.  Check sleep study.  Diabetes: New diagnosis, A1c 6.5.  Started on metformin  Snoring: Sleep study as above  RTC as needed    Medication Adjustments/Labs and Tests Ordered: Current medicines are reviewed at length with the patient today.  Concerns regarding medicines are outlined above.  Orders Placed This Encounter  Procedures  . Split night study   No orders of the defined types were placed in this encounter.   Patient Instructions  Medication Instructions:  No changes   Lab Work: None ordered   Testing/Procedures: Schedule Sleep Study  Follow-Up: At [facility], you and your health needs are our priority.  As part of our continuing mission to provide you with exceptional heart care, we have created  designated Provider Care Teams.  These Care Teams include your primary Cardiologist (physician) and Advanced Practice Providers (APPs -  Physician Assistants and Nurse Practitioners) who all work together to provide you with the care you need, when you need it.  Your next appointment:  As needed        Signed, Donato Heinz, MD  05/25/2019 4:28 PM    [place]

## 2019-05-25 ENCOUNTER — Encounter: Payer: Self-pay | Admitting: Cardiology

## 2019-05-25 ENCOUNTER — Ambulatory Visit (INDEPENDENT_AMBULATORY_CARE_PROVIDER_SITE_OTHER): Payer: PRIVATE HEALTH INSURANCE | Admitting: Cardiology

## 2019-05-25 ENCOUNTER — Other Ambulatory Visit: Payer: Self-pay

## 2019-05-25 VITALS — BP 142/91 | HR 96 | Temp 97.1°F | Ht 66.0 in | Wt 323.6 lb

## 2019-05-25 DIAGNOSIS — I10 Essential (primary) hypertension: Secondary | ICD-10-CM

## 2019-05-25 DIAGNOSIS — R0602 Shortness of breath: Secondary | ICD-10-CM | POA: Diagnosis not present

## 2019-05-25 DIAGNOSIS — R0683 Snoring: Secondary | ICD-10-CM

## 2019-05-25 DIAGNOSIS — R600 Localized edema: Secondary | ICD-10-CM | POA: Diagnosis not present

## 2019-05-25 NOTE — Patient Instructions (Signed)
Medication Instructions:  No changes   Lab Work: None ordered   Testing/Procedures: Schedule Sleep Study  Follow-Up: At [facility], you and your health needs are our priority.  As part of our continuing mission to provide you with exceptional heart care, we have created designated Provider Care Teams.  These Care Teams include your primary Cardiologist (physician) and Advanced Practice Providers (APPs -  Physician Assistants and Nurse Practitioners) who all work together to provide you with the care you need, when you need it.  Your next appointment:  As needed

## 2019-05-29 ENCOUNTER — Ambulatory Visit: Payer: PRIVATE HEALTH INSURANCE | Admitting: Family Medicine

## 2019-06-09 ENCOUNTER — Ambulatory Visit: Payer: PRIVATE HEALTH INSURANCE | Admitting: *Deleted

## 2019-06-11 ENCOUNTER — Telehealth: Payer: Self-pay | Admitting: *Deleted

## 2019-06-11 ENCOUNTER — Other Ambulatory Visit: Payer: Self-pay | Admitting: Cardiology

## 2019-06-11 DIAGNOSIS — R0683 Snoring: Secondary | ICD-10-CM

## 2019-06-11 DIAGNOSIS — I10 Essential (primary) hypertension: Secondary | ICD-10-CM

## 2019-06-11 NOTE — Telephone Encounter (Signed)
Called insurance on file to pre cert for sleep study. I was informed the patient's insurance is no longer active  as of 09/15/18. Patient and Dr Gardiner Rhyme were notified sleep due to cost order has been changed to home sleep study. Patient notified of HST scheduled 08/07/19 @12noon .

## 2019-06-23 ENCOUNTER — Ambulatory Visit: Payer: PRIVATE HEALTH INSURANCE | Admitting: Family Medicine

## 2019-06-30 ENCOUNTER — Ambulatory Visit (INDEPENDENT_AMBULATORY_CARE_PROVIDER_SITE_OTHER): Payer: PRIVATE HEALTH INSURANCE | Admitting: Family Medicine

## 2019-06-30 ENCOUNTER — Encounter: Payer: Self-pay | Admitting: Family Medicine

## 2019-06-30 ENCOUNTER — Other Ambulatory Visit: Payer: Self-pay

## 2019-06-30 VITALS — BP 127/73 | HR 101 | Temp 98.2°F | Resp 18 | Ht 66.0 in | Wt 316.0 lb

## 2019-06-30 DIAGNOSIS — I10 Essential (primary) hypertension: Secondary | ICD-10-CM | POA: Diagnosis not present

## 2019-06-30 DIAGNOSIS — E119 Type 2 diabetes mellitus without complications: Secondary | ICD-10-CM

## 2019-06-30 DIAGNOSIS — Z6841 Body Mass Index (BMI) 40.0 and over, adult: Secondary | ICD-10-CM | POA: Diagnosis not present

## 2019-06-30 LAB — POCT URINALYSIS DIPSTICK
Bilirubin, UA: NEGATIVE
Blood, UA: NEGATIVE
Glucose, UA: NEGATIVE
Ketones, UA: NEGATIVE
Leukocytes, UA: NEGATIVE
Nitrite, UA: NEGATIVE
Protein, UA: NEGATIVE
Spec Grav, UA: 1.025
Urobilinogen, UA: 0.2 U/dL
pH, UA: 5.5

## 2019-06-30 LAB — POCT GLYCOSYLATED HEMOGLOBIN (HGB A1C): Hemoglobin A1C: 5.9 % — AB (ref 4.0–5.6)

## 2019-06-30 NOTE — Progress Notes (Signed)
Patient [facility]   Established Patient Office Visit  Subjective:  Patient ID: Edward Carr, male    DOB: September 25, 1975  Age: 43 y.o. MRN: [id number]  CC:  Chief Complaint  Patient presents with  . Hypertension  . Diabetes    HPI Edward Carr a pleasant male with a medical history significant for hypertension, type 2 diabetes, and mild intermittent asthma presents for follow-up of diabetes.  Patient was diagnosed with type 2 diabetes mellitus several months ago and was started on Metformin.  He states that he has been taking Metformin consistently and has been following a carbohydrate modified diet.  He says he is very active and walks most places, but does not have an exercise routine at present.  Patient is not up-to-date with eye exam.  He checks feet consistently.  He is not under the care of podiatry.  He denies polydipsia, polyuria, or polyphagia.  He also denies headache, blurred vision, paresthesias, shortness of breath, chest pain, no urinary symptoms, nausea, vomiting, or diarrhea. BP 127/73 (BP Location: Left Arm, Patient Position: Sitting, Cuff Size: Large)   Pulse (!) 101   Temp 98.2 F (36.8 C) (Oral)   Resp 18   Ht 5\' 6"  (1.676 m)   Wt (!) 316 lb (143.3 kg)   SpO2 97%   BMI 51.00 kg/m   Past Medical History:  Diagnosis Date  . Asthma   . Hypertension     Past Surgical History:  Procedure Laterality Date  . DENTAL SURGERY      Family History  Problem Relation Age of Onset  . Diabetes Mother   . Cancer Father   . Cancer Sister     Social History   Socioeconomic History  . Marital status: Single    Spouse name: Not on file  . Number of children: Not on file  . Years of education: Not on file  . Highest education level: Not on file  Occupational History  . Not on file  Social Needs  . Financial resource strain: Not on file  . Food insecurity    Worry: Not on file    Inability: Not on file  . Transportation  needs    Medical: Not on file    Non-medical: Not on file  Tobacco Use  . Smoking status: Never Smoker  . Smokeless tobacco: Never Used  Substance and Sexual Activity  . Alcohol use: No  . Drug use: No  . Sexual activity: Never  Lifestyle  . Physical activity    Days per week: Not on file    Minutes per session: Not on file  . Stress: Not on file  Relationships  . Social Herbalist on phone: Not on file    Gets together: Not on file    Attends religious service: Not on file    Active member of club or organization: Not on file    Attends meetings of clubs or organizations: Not on file    Relationship status: Not on file  . Intimate partner violence    Fear of current or ex partner: Not on file    Emotionally abused: Not on file    Physically abused: Not on file    Forced sexual activity: Not on file  Other Topics Concern  . Not on file  Social History Narrative  . Not on file    Outpatient Medications Prior to Visit  Medication Sig Dispense Refill  . albuterol (PROVENTIL  HFA;VENTOLIN HFA) 108 (90 Base) MCG/ACT inhaler Inhale 1 puff into the lungs every 6 (six) hours as needed for wheezing or shortness of breath. 1 Inhaler 1  . amLODipine (NORVASC) 10 MG tablet Take 1 tablet (10 mg total) by mouth daily. 90 tablet 1  . cloNIDine (CATAPRES) 0.1 MG tablet Take 0.5 tablets (0.05 mg total) by mouth at bedtime. X 2 weeks. Then 1/2 pill by mouth every other night x 2 weeks then d/c 30 tablet 0  . gabapentin (NEURONTIN) 100 MG capsule Take 1 capsule (100 mg total) by mouth at bedtime. 30 capsule 3  . lisinopril-hydrochlorothiazide (PRINZIDE,ZESTORETIC) 20-25 MG tablet Take 1 tablet by mouth daily. 90 tablet 1  . metFORMIN (GLUCOPHAGE) 500 MG tablet Take 1 tablet (500 mg total) by mouth 2 (two) times daily with a meal. 60 tablet 3   No facility-administered medications prior to visit.     No Known Allergies  ROS Review of Systems  Constitutional: Negative.  Negative  for activity change and appetite change.  HENT: Negative.   Respiratory: Negative.   Cardiovascular: Negative for chest pain and leg swelling.  Gastrointestinal: Negative.   Endocrine: Negative.  Negative for polydipsia, polyphagia and polyuria.  Genitourinary: Negative.   Musculoskeletal: Negative.   Skin: Negative.   Neurological: Negative.  Negative for dizziness and facial asymmetry.  Hematological: Negative.   Psychiatric/Behavioral: Negative.       Objective:    Physical Exam  Constitutional: He is oriented to person, place, and time. He appears well-developed and well-nourished.  Eyes: Pupils are equal, round, and reactive to light.  Cardiovascular: Normal rate, regular rhythm and normal heart sounds.  Pulmonary/Chest: Effort normal and breath sounds normal.  Abdominal: Soft. Bowel sounds are normal.  Abdominal obesity  Musculoskeletal:        General: Normal range of motion.     Cervical back: Normal range of motion.  Neurological: He is alert and oriented to person, place, and time.  Skin: Skin is warm.  Psychiatric: He has a normal mood and affect. His behavior is normal. Judgment and thought content normal.    BP 127/73 (BP Location: Left Arm, Patient Position: Sitting, Cuff Size: Large)   Pulse (!) 101   Temp 98.2 F (36.8 C) (Oral)   Resp 18   Ht 5\' 6"  (1.676 m)   Wt (!) 316 lb (143.3 kg)   SpO2 97%   BMI 51.00 kg/m  Wt Readings from Last 3 Encounters:  06/30/19 (!) 316 lb (143.3 kg)  05/25/19 (!) 323 lb 9.6 oz (146.8 kg)  05/06/19 (!) 316 lb (143.3 kg)     There are no preventive care reminders to display for this patient.  There are no preventive care reminders to display for this patient.  Lab Results  Component Value Date   TSH 4.110 05/11/2019   Lab Results  Component Value Date   WBC 8.6 08/22/2016   HGB 13.4 08/22/2016   HCT 40.7 08/22/2016   MCV 82.1 08/22/2016   PLT 323 08/22/2016   Lab Results  Component Value Date   NA 139  04/15/2019   K 3.8 04/15/2019   CO2 23 04/15/2019   GLUCOSE 97 04/15/2019   BUN 10 04/15/2019   CREATININE 1.09 04/15/2019   BILITOT 0.6 04/15/2019   ALKPHOS 70 04/15/2019   AST 20 04/15/2019   ALT 23 04/15/2019   PROT 7.4 04/15/2019   ALBUMIN 4.4 04/15/2019   CALCIUM 9.7 04/15/2019   Lab Results  Component Value Date  CHOL 149 04/15/2019   Lab Results  Component Value Date   HDL 31 (L) 04/15/2019   Lab Results  Component Value Date   LDLCALC 92 04/15/2019   Lab Results  Component Value Date   TRIG 146 04/15/2019   Lab Results  Component Value Date   CHOLHDL 4.8 04/15/2019   Lab Results  Component Value Date   HGBA1C 5.9 (A) 06/30/2019      Assessment & Plan:   Problem List Items Addressed This Visit      Cardiovascular and Mediastinum   Essential hypertension   Relevant Orders   Urinalysis Dipstick (Completed)    Other Visit Diagnoses    Type 2 diabetes mellitus not at goal [facility])    -  Primary   Relevant Orders   Urinalysis Dipstick (Completed)   HgB A1c (Completed)     1. Type 2 diabetes mellitus not at goal [facility]) Hemoglobin A1c has improved to 5.9, which is at goal.  Patient advised to continue carbohydrate modified diet.  No medication changes warranted during this appointment. - Urinalysis Dipstick - HgB A1c  2. Essential hypertension - Continue medication, monitor blood pressure at home. Continue DASH diet. Reminder to go to the ER if any CP, SOB, nausea, dizziness, severe HA, changes vision/speech, left arm numbness and tingling and jaw pain.  - Urinalysis Dipstick  3. Morbid obesity with BMI of 50.0-59.9, adult [facility] Patient has lost a total of 7 pounds over the past month, he was commended on weight loss.  Our weight loss goal over the next 3 months is a total of 16 pounds, which is a 5% weight reduction.  Patient and I discussed diet options at length.  It seems that carbohydrate modified diet is most appropriate at this time Body mass  index is 51 kg/m. Filed Weights   06/30/19 1355  Weight: (!) 316 lb (143.3 kg)     Follow-up: 3 months for chronic conditions  Janisse Ghan Al [place]  APRN, MSN, FNP-C Patient [place] [address] [place], [place] [address] [phone]

## 2019-06-30 NOTE — Patient Instructions (Signed)
Our weight loss goal is a total of 16 pounds over the next 3 months You have lost 7 pounds over the past month Your hemoglobin a1C 5.9 BP 127/73 (BP Location: Left Arm, Patient Position: Sitting, Cuff Size: Large)   Pulse (!) 101   Temp 98.2 F (36.8 C) (Oral)   Resp 18   Ht 5\' 6"  (1.676 m)   Wt (!) 316 lb (143.3 kg)   SpO2 97%   BMI 51.00 kg/m -                               Your A1C goal is less than 7. Your fasting blood sugar  Upon awakening goal is between 110-140.  Your LDL  (bad cholesterol goal is less than 100 Blood pressure goal is <140/90.  Recommend a lowfat, low carbohydrate diet divided over 5-6 small meals, increase water intake to 6-8 glasses, and 150 minutes per week of cardiovascular exercise.   Take your medications as prescribed Make sure that you are familiar with each one of your medications and what they are used to treat.  If you are unsure of medications, please bring to follow up Will send referral for eye exam  Please keep your scheduled follow up appointment.

## 2019-07-14 ENCOUNTER — Ambulatory Visit: Payer: PRIVATE HEALTH INSURANCE | Admitting: Family Medicine

## 2019-08-04 ENCOUNTER — Telehealth: Payer: Self-pay | Admitting: *Deleted

## 2019-08-04 NOTE — Telephone Encounter (Signed)
Received a call from [place] at [facility] me the patient has canceled his appointment for the HST. States he wants to wait until he gets his "orange card." patient has no insurance. ordering provider, Dr Gardiner Rhyme will be notified.

## 2019-08-07 ENCOUNTER — Encounter (HOSPITAL_BASED_OUTPATIENT_CLINIC_OR_DEPARTMENT_OTHER): Payer: PRIVATE HEALTH INSURANCE | Admitting: Cardiovascular Disease

## 2019-08-10 ENCOUNTER — Other Ambulatory Visit: Payer: Self-pay | Admitting: Family Medicine

## 2019-08-10 DIAGNOSIS — I10 Essential (primary) hypertension: Secondary | ICD-10-CM

## 2019-08-23 ENCOUNTER — Other Ambulatory Visit: Payer: Self-pay | Admitting: Family Medicine

## 2019-08-23 DIAGNOSIS — I10 Essential (primary) hypertension: Secondary | ICD-10-CM

## 2019-09-21 ENCOUNTER — Other Ambulatory Visit: Payer: Self-pay | Admitting: Family Medicine

## 2019-09-21 DIAGNOSIS — E119 Type 2 diabetes mellitus without complications: Secondary | ICD-10-CM

## 2019-10-13 ENCOUNTER — Ambulatory Visit: Payer: PRIVATE HEALTH INSURANCE | Admitting: Family Medicine

## 2019-10-28 ENCOUNTER — Other Ambulatory Visit: Payer: Self-pay | Admitting: Family Medicine

## 2019-10-28 DIAGNOSIS — E119 Type 2 diabetes mellitus without complications: Secondary | ICD-10-CM

## 2019-11-24 ENCOUNTER — Encounter: Payer: Self-pay | Admitting: Family Medicine

## 2019-11-24 ENCOUNTER — Ambulatory Visit (INDEPENDENT_AMBULATORY_CARE_PROVIDER_SITE_OTHER): Payer: PRIVATE HEALTH INSURANCE | Admitting: Family Medicine

## 2019-11-24 ENCOUNTER — Other Ambulatory Visit: Payer: Self-pay

## 2019-11-24 VITALS — BP 141/92 | HR 74 | Ht 65.0 in | Wt 311.8 lb

## 2019-11-24 DIAGNOSIS — I10 Essential (primary) hypertension: Secondary | ICD-10-CM | POA: Diagnosis not present

## 2019-11-24 DIAGNOSIS — J45909 Unspecified asthma, uncomplicated: Secondary | ICD-10-CM

## 2019-11-24 DIAGNOSIS — E119 Type 2 diabetes mellitus without complications: Secondary | ICD-10-CM | POA: Diagnosis not present

## 2019-11-24 LAB — POCT URINALYSIS DIPSTICK
Bilirubin, UA: NEGATIVE
Glucose, UA: NEGATIVE
Ketones, UA: NEGATIVE
Leukocytes, UA: NEGATIVE
Nitrite, UA: NEGATIVE
Protein, UA: POSITIVE — AB
Spec Grav, UA: 1.02 (ref 1.010–1.025)
Urobilinogen, UA: 1 E.U./dL
pH, UA: 6 (ref 5.0–8.0)

## 2019-11-24 LAB — POCT GLYCOSYLATED HEMOGLOBIN (HGB A1C)
HbA1c POC (<> result, manual entry): 6.1 % (ref 4.0–5.6)
HbA1c, POC (controlled diabetic range): 6.1 % (ref 0.0–7.0)
HbA1c, POC (prediabetic range): 6.1 % (ref 5.7–6.4)
Hemoglobin A1C: 6.1 % — AB (ref 4.0–5.6)

## 2019-11-24 MED ORDER — ALBUTEROL SULFATE HFA 108 (90 BASE) MCG/ACT IN AERS: 1.0000 | INHALATION_SPRAY | Freq: Four times a day (QID) | RESPIRATORY_TRACT | 1 refills | Status: DC | PRN

## 2019-11-24 NOTE — Progress Notes (Signed)
Patient [facility]    Established Patient Office Visit  Subjective:  Patient ID: Edward Carr, male    DOB: 14-Oct-1975  Age: 44 y.o. MRN: [id number]  CC:  Chief Complaint  Patient presents with  . Follow-up    headaches, hurting in back of head, checking b/s    Izael Gramza, a 44 year old male with a medical history significant for type 2 DM, hyperlipidemia, and hypertension presents for a follow up of chronic conditions. Patient is doing well and is without complaint.   Diabetes He has type 2 diabetes mellitus. Pertinent negatives for hypoglycemia include no sweats. Pertinent negatives for diabetes include no blurred vision, no chest pain, no fatigue, no foot paresthesias, no foot ulcerations, no polydipsia, no polyphagia, no polyuria, no weakness and no weight loss. Pertinent negatives for hypoglycemia complications include no blackouts, no hospitalization, no nocturnal hypoglycemia and no required assistance. Symptoms are stable. Pertinent negatives for diabetic complications include no CVA. There are no known risk factors for coronary artery disease. He is compliant with treatment all of the time. His weight is stable. He participates in exercise daily. There is no change in his home blood glucose trend.  Hypertension This is a chronic problem. The problem is controlled. Pertinent negatives include no blurred vision, chest pain, neck pain, orthopnea, shortness of breath or sweats. There are no compliance problems.  There is no history of kidney disease or CVA.    Past Medical History:  Diagnosis Date  . Asthma   . Hypertension     Past Surgical History:  Procedure Laterality Date  . DENTAL SURGERY      Family History  Problem Relation Age of Onset  . Diabetes Mother   . Cancer Father   . Cancer Sister     Social History   Socioeconomic History  . Marital status: Single    Spouse name: Not on file  . Number of children: Not on  file  . Years of education: Not on file  . Highest education level: Not on file  Occupational History  . Not on file  Tobacco Use  . Smoking status: Never Smoker  . Smokeless tobacco: Never Used  Substance and Sexual Activity  . Alcohol use: No  . Drug use: No  . Sexual activity: Never  Other Topics Concern  . Not on file  Social History Narrative  . Not on file   Social Determinants of Health   Financial Resource Strain:   . Difficulty of Paying Living Expenses:   Food Insecurity:   . Worried About Charity fundraiser in the Last Year:   . Arboriculturist in the Last Year:   Transportation Needs:   . Film/video editor (Medical):   Marland Kitchen Lack of Transportation (Non-Medical):   Physical Activity:   . Days of Exercise per Week:   . Minutes of Exercise per Session:   Stress:   . Feeling of Stress :   Social Connections:   . Frequency of Communication with Friends and Family:   . Frequency of Social Gatherings with Friends and Family:   . Attends Religious Services:   . Active Member of Clubs or Organizations:   . Attends Archivist Meetings:   Marland Kitchen Marital Status:   Intimate Partner Violence:   . Fear of Current or Ex-Partner:   . Emotionally Abused:   Marland Kitchen Physically Abused:   . Sexually Abused:     Outpatient Medications  Prior to Visit  Medication Sig Dispense Refill  . albuterol (PROVENTIL HFA;VENTOLIN HFA) 108 (90 Base) MCG/ACT inhaler Inhale 1 puff into the lungs every 6 (six) hours as needed for wheezing or shortness of breath. 1 Inhaler 1  . amLODipine (NORVASC) 10 MG tablet Take 1 tablet by mouth once daily 90 tablet 0  . cloNIDine (CATAPRES) 0.1 MG tablet Take 0.5 tablets (0.05 mg total) by mouth at bedtime. X 2 weeks. Then 1/2 pill by mouth every other night x 2 weeks then d/c 30 tablet 0  . gabapentin (NEURONTIN) 100 MG capsule Take 1 capsule (100 mg total) by mouth at bedtime. 30 capsule 3  . lisinopril-hydrochlorothiazide (ZESTORETIC) 20-25 MG tablet  Take 1 tablet by mouth once daily 90 tablet 0  . metFORMIN (GLUCOPHAGE) 500 MG tablet TAKE 1 TABLET BY MOUTH TWICE DAILY WITH A MEAL 60 tablet 0   No facility-administered medications prior to visit.    No Known Allergies  ROS Review of Systems  Constitutional: Negative for fatigue and weight loss.  Eyes: Negative for blurred vision.  Respiratory: Negative for shortness of breath.   Cardiovascular: Negative for chest pain and orthopnea.  Endocrine: Negative for polydipsia, polyphagia and polyuria.  Musculoskeletal: Negative for neck pain.  Neurological: Negative for weakness.      Objective:    Physical Exam  BP (!) 147/90 (BP Location: Left Arm)   Pulse 68   Ht 5\' 5"  (1.651 m)   Wt (!) 311 lb 12.8 oz (141.4 kg)   SpO2 100%   BMI 51.89 kg/m  Wt Readings from Last 3 Encounters:  11/24/19 (!) 311 lb 12.8 oz (141.4 kg)  06/30/19 (!) 316 lb (143.3 kg)  05/25/19 (!) 323 lb 9.6 oz (146.8 kg)     There are no preventive care reminders to display for this patient.  There are no preventive care reminders to display for this patient.  Lab Results  Component Value Date   TSH 4.110 05/11/2019   Lab Results  Component Value Date   WBC 8.6 08/22/2016   HGB 13.4 08/22/2016   HCT 40.7 08/22/2016   MCV 82.1 08/22/2016   PLT 323 08/22/2016   Lab Results  Component Value Date   NA 139 04/15/2019   K 3.8 04/15/2019   CO2 23 04/15/2019   GLUCOSE 97 04/15/2019   BUN 10 04/15/2019   CREATININE 1.09 04/15/2019   BILITOT 0.6 04/15/2019   ALKPHOS 70 04/15/2019   AST 20 04/15/2019   ALT 23 04/15/2019   PROT 7.4 04/15/2019   ALBUMIN 4.4 04/15/2019   CALCIUM 9.7 04/15/2019   Lab Results  Component Value Date   CHOL 149 04/15/2019   Lab Results  Component Value Date   HDL 31 (L) 04/15/2019   Lab Results  Component Value Date   LDLCALC 92 04/15/2019   Lab Results  Component Value Date   TRIG 146 04/15/2019   Lab Results  Component Value Date   CHOLHDL 4.8  04/15/2019   Lab Results  Component Value Date   HGBA1C 6.1 (A) 11/24/2019   HGBA1C 6.1 11/24/2019   HGBA1C 6.1 11/24/2019   HGBA1C 6.1 11/24/2019      Assessment & Plan:   Problem List Items Addressed This Visit    None    Visit Diagnoses    Type 2 diabetes mellitus not at goal [facility])    -  Primary   Relevant Orders   HgB A1c (Completed)   POCT Urinalysis Dipstick  Type 2 diabetes mellitus not at goal [facility]) Patient's hemoglobin a1C is 6.1, which is at goal. No medication changes warranted. The patient is asked to make an attempt to improve diet and exercise patterns to aid in medical management of this problem.  - HgB A1c - POCT Urinalysis Dipstick   Essential hypertension Continue medication, monitor blood pressure at home. Continue DASH diet. Reminder to go to the ER if any CP, SOB, nausea, dizziness, severe HA, changes vision/speech, left arm numbness and tingling and jaw pain.   Mild asthma without complication, unspecified whether persistent - albuterol (VENTOLIN HFA) 108 (90 Base) MCG/ACT inhaler; Inhale 1 puff into the lungs every 6 (six) hours as needed for wheezing or shortness of breath.  Dispense: 8 g; Refill: 1   Follow-up: Return in about 3 months (around 02/23/2020) for hypertension.    [name]  APRN, MSN, FNP-C [facility] [address] [place], [place] [address] [phone]

## 2019-11-24 NOTE — Patient Instructions (Signed)
Ketogenic Eating Plan, Adult  A ketogenic eating plan is a diet that is very low in carbohydrates, moderately low in protein, and very high in fat. Your body normally gets energy from eating carbohydrates. If you limit carbohydrates in your diet, your body will start to use stored fat as an energy source. When fat is broken down for energy, it enters your blood as a substance called ketones. A ketogenic eating plan relies mostly on ketones for energy. This eating plan can cause rapid weight loss because the body burns fat for fuel.  What are the benefits of a ketogenic eating plan?  Epilepsy  A ketogenic diet is sometimes used to treat epilepsy in children who have seizures that are not helped by medicines. As a treatment for epilepsy in children, ketogenic eating plans have been well studied and used for many years. This type of diet is usually started in the hospital and carefully monitored by a health care team. It is usually tried for several months to see if it will lessen seizures.  Other conditions  Ketogenic eating plans have also been studied to help treat other conditions, including:   Cancer.   Type 2 diabetes.   Alzheimer's disease.   Parkinson's disease.   Autism.   Multiple sclerosis.  More studies need to be done to see how well this eating plan works for these and other conditions and what the long-term side effects might be.  What are the side effects of a ketogenic eating plan?  Side effects of a ketogenic diet may include:   Vitamin deficiencies.   Loss of body fluids (dehydration).   Bad breath.   Nausea.   Hunger.   Difficulty passing stool (constipation).   Problems with menstrual periods.   Inflammation of the pancreas.   Kidney stones or gall bladder stones.   Bone fractures.   High cholesterol.  What are tips for following this plan?  Reading food labels   Look for foods that have a low glycemic index (GI) label.   Read food ingredient lists to check for any hidden or  added sugar.   Check food labels for the number of grams of carbohydrates and protein. This ensures that you are eating the right amounts of these nutrients. This is important.  Cooking   Carefully measure or weigh foods.   Make desserts using ketogenic or low GI recipes.   Avoid cooking with sauces that contain added sugar, such as barbecue sauce or ketchup.  Meal planning   Aim for a daily meal and snack time schedule that you can follow consistently.   Every meal will include high-fat items such as avocado, cream, butter, or mayonnaise.  General information     Buy a gram scale to weigh foods. This is necessary to follow this diet correctly and accurately. Your dietitian will teach you how to use one for this diet.   Ask your health care provider what steps you can take to avoid side effects of this eating plan, such as constipation or kidney stones.   Take vitamin and mineral supplements only as told by a health care provider or dietitian.   Work with your dietitian and health care provider to handle the key challenges of this diet and to help you stay on track.  What foods should I eat?    Fruits  Fresh pineapple, peaches, and apples. Other fresh and frozen fruits in small amounts.  Vegetables  Lettuce. Beets. Bok choy. Eggplant. Tomatoes. Turnips. Cucumbers. Peppers.   Sugar-free, caffeine-free beverages. Mineral water or club soda. Caffeine-free, carbohydrate-free herbal tea. Fats and oils Avocado. Cream. Sour cream. Cream cheese. Butter. Plant-based oils, such as olive, canola, and sunflower. Margarine. Mayonnaise. Sweets and desserts Any homemade sweets or  desserts made using ketogenic diet recipes. The items listed above may not be a complete list of recommended foods and beverages. Contact a dietitian for more information. What foods should I avoid? Fruits Fruit juice. Fruits packed in syrups. Dried or candied fruits. Vegetables Corn. Potatoes. Peas. Grains All bread, dry cereals, and cooked cereals with added sugar. Baked goods. Crackers and pretzels. Meats and other proteins Meat, poultry, or fish prepared with flour or breading. Nut butters with added sugar. Beans. Dairy Milk. Yogurt. Fats and oils Salad dressings with added sugar. Gravies. Beverages Sugar-sweetened teas, coffee drinks, or soft drinks. Juice. Sports drinks. Sweets and desserts All sweets and desserts, unless the dessert is homemade using ketogenic diet recipes. The items listed above may not be a complete list of foods and beverages to avoid. Contact a dietitian for more information. Summary  A ketogenic eating plan is a diet that is very low in carbohydrates, moderately low in protein, and very high in fat.  Aim for a daily meal and snack time schedule that you can follow consistently.  Buy a gram scale to weigh foods. This is necessary to follow this diet correctly and accurately. Your dietitian will teach you how to use one for this diet.  Work closely with your health care provider and a dietitian while you are following a ketogenic eating plan. This information is not intended to replace advice given to you by your health care provider. Make sure you discuss any questions you have with your health care provider. Document Revised: 11/21/2017 Document Reviewed: 11/21/2017 Elsevier Patient Education  2020 Elsevier Inc.  

## 2019-12-05 ENCOUNTER — Other Ambulatory Visit: Payer: Self-pay

## 2019-12-05 DIAGNOSIS — E119 Type 2 diabetes mellitus without complications: Secondary | ICD-10-CM

## 2019-12-07 MED ORDER — METFORMIN HCL 500 MG PO TABS: 500.0000 mg | ORAL_TABLET | Freq: Two times a day (BID) | ORAL | 0 refills | Status: DC

## 2019-12-10 ENCOUNTER — Telehealth: Payer: Self-pay

## 2019-12-10 NOTE — Telephone Encounter (Signed)
Edward Carr made an appointment for tomorrow. He c/o a twitch in his chest no pain. Patient advised if symptoms worsen or comes more frequent. Seek emergency help.

## 2019-12-11 ENCOUNTER — Ambulatory Visit (INDEPENDENT_AMBULATORY_CARE_PROVIDER_SITE_OTHER): Payer: PRIVATE HEALTH INSURANCE | Admitting: Nurse Practitioner

## 2019-12-11 ENCOUNTER — Other Ambulatory Visit: Payer: Self-pay

## 2019-12-11 ENCOUNTER — Encounter: Payer: Self-pay | Admitting: Nurse Practitioner

## 2019-12-11 VITALS — BP 144/97 | HR 99 | Ht 65.0 in | Wt 315.0 lb

## 2019-12-11 DIAGNOSIS — R002 Palpitations: Secondary | ICD-10-CM | POA: Diagnosis not present

## 2019-12-11 DIAGNOSIS — Z6841 Body Mass Index (BMI) 40.0 and over, adult: Secondary | ICD-10-CM

## 2019-12-11 DIAGNOSIS — E119 Type 2 diabetes mellitus without complications: Secondary | ICD-10-CM

## 2019-12-11 NOTE — Progress Notes (Signed)
[place], [place]  [address] Phone:  [phone]   Fax:  [phone]   Established Patient Office Visit  Subjective:  Patient ID: Edward Carr, male    DOB: 15-Oct-1975  Age: 44 y.o. MRN: [id number]  CC:  Chief Complaint  Patient presents with  . Palpitations    started on start on sunday when on yesterday , some numbness in hand on left side , some sob when walking     HPI Edward Carr presents for palpitations. He  has a past medical history of Asthma and Hypertension.   Palpitations Patient complains of palpitations. The symptoms are mild, occur intermittently, and last 30-60 minutes per episode. They tend to occur while not doing anything.  Cardiac risk factors include: diabetes mellitus, hypertension, male gender, obesity (BMI >= 30 kg/m2) and sedentary lifestyle. Aggravating factors: caffeine. He is trying to cut down on the caffeine. He admits that he mostly used caffeine free. Alleviating factors: spontaneous. Associated symptoms: none. Patient denies: abdominal pain, calf pain, chest pain, cough, dizziness, leg swelling and shortness of breath.he admits that he has Sob on exertion but his normal. He has occasional leg swelling but this is generally related to dietary intake of salt. He admits that he has numbness in his left hand and this is noted in the am. He denies history of neck pain or injury or CTS.   Blurred vision    Past Medical History:  Diagnosis Date  . Asthma   . Hypertension     Past Surgical History:  Procedure Laterality Date  . DENTAL SURGERY      Family History  Problem Relation Age of Onset  . Diabetes Mother   . Cancer Father   . Cancer Sister     Social History   Socioeconomic History  . Marital status: Single    Spouse name: Not on file  . Number of children: Not on file  . Years of education: Not on file  . Highest education level: Not on file  Occupational History  . Not on file    Tobacco Use  . Smoking status: Never Smoker  . Smokeless tobacco: Never Used  Substance and Sexual Activity  . Alcohol use: No  . Drug use: No  . Sexual activity: Never  Other Topics Concern  . Not on file  Social History Narrative  . Not on file   Social Determinants of Health   Financial Resource Strain:   . Difficulty of Paying Living Expenses:   Food Insecurity:   . Worried About Charity fundraiser in the Last Year:   . Arboriculturist in the Last Year:   Transportation Needs:   . Film/video editor (Medical):   Marland Kitchen Lack of Transportation (Non-Medical):   Physical Activity:   . Days of Exercise per Week:   . Minutes of Exercise per Session:   Stress:   . Feeling of Stress :   Social Connections:   . Frequency of Communication with Friends and Family:   . Frequency of Social Gatherings with Friends and Family:   . Attends Religious Services:   . Active Member of Clubs or Organizations:   . Attends Archivist Meetings:   Marland Kitchen Marital Status:   Intimate Partner Violence:   . Fear of Current or Ex-Partner:   . Emotionally Abused:   Marland Kitchen Physically Abused:   . Sexually Abused:     Outpatient Medications Prior to  Visit  Medication Sig Dispense Refill  . albuterol (VENTOLIN HFA) 108 (90 Base) MCG/ACT inhaler Inhale 1 puff into the lungs every 6 (six) hours as needed for wheezing or shortness of breath. 8 g 1  . amLODipine (NORVASC) 10 MG tablet Take 1 tablet by mouth once daily 90 tablet 0  . cloNIDine (CATAPRES) 0.1 MG tablet Take 0.5 tablets (0.05 mg total) by mouth at bedtime. X 2 weeks. Then 1/2 pill by mouth every other night x 2 weeks then d/c 30 tablet 0  . lisinopril-hydrochlorothiazide (ZESTORETIC) 20-25 MG tablet Take 1 tablet by mouth once daily 90 tablet 0  . metFORMIN (GLUCOPHAGE) 500 MG tablet Take 1 tablet (500 mg total) by mouth 2 (two) times daily with a meal. 60 tablet 0  . gabapentin (NEURONTIN) 100 MG capsule Take 1 capsule (100 mg total) by  mouth at bedtime. (Patient not taking: Reported on 12/11/2019) 30 capsule 3   No facility-administered medications prior to visit.    No Known Allergies  ROS Review of Systems    Objective:    Physical Exam  Constitutional: He is oriented to person, place, and time. He appears well-developed. No distress.  Obese  HENT:  Head: Normocephalic.  Cardiovascular: Normal rate, regular rhythm and normal heart sounds.  Pulmonary/Chest: Effort normal and breath sounds normal.  Abdominal:  Diminished BS   Musculoskeletal:        General: Tenderness present. Normal range of motion.     Cervical back: Normal range of motion.  Neurological: He is alert and oriented to person, place, and time.  Skin: Skin is warm and dry.  Psychiatric: He has a normal mood and affect. His behavior is normal. Judgment and thought content normal.    BP (!) 144/97 (BP Location: Right Arm)   Pulse 99   Ht 5\' 5"  (1.651 m)   Wt (!) 315 lb (142.9 kg)   SpO2 98%   BMI 52.42 kg/m  Wt Readings from Last 3 Encounters:  12/11/19 (!) 315 lb (142.9 kg)  11/24/19 (!) 311 lb 12.8 oz (141.4 kg)  06/30/19 (!) 316 lb (143.3 kg)     Health Maintenance Due  Topic Date Due  . COVID-19 Vaccine (2 - Pfizer 2-dose series) 12/05/2019    There are no preventive care reminders to display for this patient.  Lab Results  Component Value Date   TSH 1.430 12/11/2019   Lab Results  Component Value Date   WBC 10.3 12/11/2019   HGB 13.7 12/11/2019   HCT 40.7 12/11/2019   MCV 82 12/11/2019   PLT 395 12/11/2019   Lab Results  Component Value Date   NA 139 12/11/2019   K 4.0 12/11/2019   CO2 23 04/15/2019   GLUCOSE 120 (H) 12/11/2019   BUN 13 12/11/2019   CREATININE 1.27 12/11/2019   BILITOT 0.4 12/11/2019   ALKPHOS 67 12/11/2019   AST 15 12/11/2019   ALT 23 04/15/2019   PROT 7.3 12/11/2019   ALBUMIN 4.4 12/11/2019   CALCIUM 9.9 12/11/2019   Lab Results  Component Value Date   CHOL 149 04/15/2019   Lab  Results  Component Value Date   HDL 31 (L) 04/15/2019   Lab Results  Component Value Date   LDLCALC 92 04/15/2019   Lab Results  Component Value Date   TRIG 146 04/15/2019   Lab Results  Component Value Date   CHOLHDL 4.8 04/15/2019   Lab Results  Component Value Date   HGBA1C 6.1 (A) 11/24/2019  HGBA1C 6.1 11/24/2019   HGBA1C 6.1 11/24/2019   HGBA1C 6.1 11/24/2019      Assessment & Plan:   Problem List Items Addressed This Visit    None    Visit Diagnoses    Palpitations    -  Primary   Encouraged pt to follow up with cardiology and have sleep study completed once insurance/orange card has been verified.    Relevant Orders   CBC with Differential/Platelet (Completed)   Comp. Metabolic Panel (12) (Completed)   Vitamin B12 (Completed)   Magnesium (Completed)   TSH (Completed)   Type 2 diabetes mellitus without complication, without long-term current use of insulin (HCC)       Relevant Orders   Ambulatory referral to Nutrition and Diabetic Education   Morbid obesity with BMI of 50.0-59.9, adult [facility])       Relevant Orders   Ambulatory referral to Nutrition and Diabetic Education      No orders of the defined types were placed in this encounter.   Follow-up: Return in about 3 months (around 03/12/2020).    Vevelyn Francois, NP

## 2019-12-11 NOTE — Patient Instructions (Signed)
Palpitations Palpitations are feelings that your heartbeat is not normal. Your heartbeat may feel like it is:  Uneven.  Faster than normal.  Fluttering.  Skipping a beat. This is usually not a serious problem. In some cases, you may need tests to rule out any serious problems. Follow these instructions at home: Pay attention to any changes in your condition. Take these actions to help manage your symptoms: Eating and drinking  Avoid: ? Coffee, tea, soft drinks, and energy drinks. ? Chocolate. ? Alcohol. ? Diet pills. Lifestyle   Try to lower your stress. These things can help you relax: ? Yoga. ? Deep breathing and meditation. ? Exercise. ? Using words and images to create positive thoughts (guided imagery). ? Using your mind to control things in your body (biofeedback).  Do not use drugs.  Get plenty of rest and sleep. Keep a regular bed time. General instructions   Take over-the-counter and prescription medicines only as told by your doctor.  Do not use any products that contain nicotine or tobacco, such as cigarettes and e-cigarettes. If you need help quitting, ask your doctor.  Keep all follow-up visits as told by your doctor. This is important. You may need more tests if palpitations do not go away or get worse. Contact a doctor if:  Your symptoms last more than 24 hours.  Your symptoms occur more often. Get help right away if you:  Have chest pain.  Feel short of breath.  Have a very bad headache.  Feel dizzy.  Pass out (faint). Summary  Palpitations are feelings that your heartbeat is uneven or faster than normal. It may feel like your heart is fluttering or skipping a beat.  Avoid food and drinks that may cause palpitations. These include caffeine, chocolate, and alcohol.  Try to lower your stress. Do not smoke or use drugs.  Get help right away if you faint or have chest pain, shortness of breath, a severe headache, or dizziness. This  information is not intended to replace advice given to you by your health care provider. Make sure you discuss any questions you have with your health care provider. Document Revised: 08/28/2017 Document Reviewed: 08/28/2017 Elsevier Patient Education  2020 Elsevier Inc.  

## 2019-12-12 LAB — COMP. METABOLIC PANEL (12)
AST: 15 IU/L (ref 0–40)
Albumin/Globulin Ratio: 1.5 (ref 1.2–2.2)
Albumin: 4.4 g/dL (ref 4.0–5.0)
Alkaline Phosphatase: 67 IU/L (ref 39–117)
BUN/Creatinine Ratio: 10 (ref 9–20)
BUN: 13 mg/dL (ref 6–24)
Bilirubin Total: 0.4 mg/dL (ref 0.0–1.2)
Calcium: 9.9 mg/dL (ref 8.7–10.2)
Chloride: 97 mmol/L (ref 96–106)
Creatinine, Ser: 1.27 mg/dL (ref 0.76–1.27)
GFR calc Af Amer: 81 mL/min/{1.73_m2} (ref >59)
GFR calc non Af Amer: 70 mL/min/{1.73_m2} (ref >59)
Globulin, Total: 2.9 g/dL (ref 1.5–4.5)
Glucose: 120 mg/dL — ABNORMAL HIGH (ref 65–99)
Potassium: 4 mmol/L (ref 3.5–5.2)
Sodium: 139 mmol/L (ref 134–144)
Total Protein: 7.3 g/dL (ref 6.0–8.5)

## 2019-12-12 LAB — CBC WITH DIFFERENTIAL/PLATELET
Basophils Absolute: 0 10*3/uL (ref 0.0–0.2)
Basos: 0 %
EOS (ABSOLUTE): 0.3 10*3/uL (ref 0.0–0.4)
Eos: 2 %
Hematocrit: 40.7 % (ref 37.5–51.0)
Hemoglobin: 13.7 g/dL (ref 13.0–17.7)
Immature Grans (Abs): 0 10*3/uL (ref 0.0–0.1)
Immature Granulocytes: 0 %
Lymphocytes Absolute: 2.2 10*3/uL (ref 0.7–3.1)
Lymphs: 22 %
MCH: 27.6 pg (ref 26.6–33.0)
MCHC: 33.7 g/dL (ref 31.5–35.7)
MCV: 82 fL (ref 79–97)
Monocytes Absolute: 0.6 10*3/uL (ref 0.1–0.9)
Monocytes: 5 %
Neutrophils Absolute: 7.2 10*3/uL — ABNORMAL HIGH (ref 1.4–7.0)
Neutrophils: 71 %
Platelets: 395 10*3/uL (ref 150–450)
RBC: 4.97 x10E6/uL (ref 4.14–5.80)
RDW: 14.5 % (ref 11.6–15.4)
WBC: 10.3 10*3/uL (ref 3.4–10.8)

## 2019-12-12 LAB — VITAMIN B12: Vitamin B-12: 288 pg/mL (ref 232–1245)

## 2019-12-12 LAB — TSH: TSH: 1.43 u[IU]/mL (ref 0.450–4.500)

## 2019-12-12 LAB — MAGNESIUM: Magnesium: 2.3 mg/dL (ref 1.6–2.3)

## 2019-12-15 ENCOUNTER — Telehealth: Payer: Self-pay | Admitting: Clinical

## 2019-12-15 NOTE — Telephone Encounter (Signed)
Integrated Behavioral Health Case Management Referral Note  12/15/2019 Name: Shaquille Marchetta MRN: [id number] DOB: 08-04-1975 Tyrez Sever is a 44 y.o. year old male who sees Dionisio David, NP for primary care. LCSW was consulted to assess patient's access to community resources and assist the patient with [employer] - eye exam.  Assessment: Patient experiencing Financial constraints related to low income and lack of insurance coverage. Patient also in need of an eye exam. CSW called patient today to follow up on referral from PCP. Patient does not have insurance coverage but has applied for the [employer]. Orange Card will cover eye exam with one of the [facility] providers; PCP can make referral for optometry specialist once patient is approved for [employer]. Advised patient to call [facility] once approved and request the referral.   Review of patient status, including review of consultants reports, relevant laboratory and other test results, and collaboration with appropriate care team members and the patient's provider was performed as part of comprehensive patient evaluation and provision of services.    SDOH (Social Determinants of Health) assessments performed: No  Outpatient Encounter Medications as of 12/15/2019  Medication Sig  . albuterol (VENTOLIN HFA) 108 (90 Base) MCG/ACT inhaler Inhale 1 puff into the lungs every 6 (six) hours as needed for wheezing or shortness of breath.  Marland Kitchen amLODipine (NORVASC) 10 MG tablet Take 1 tablet by mouth once daily  . cloNIDine (CATAPRES) 0.1 MG tablet Take 0.5 tablets (0.05 mg total) by mouth at bedtime. X 2 weeks. Then 1/2 pill by mouth every other night x 2 weeks then d/c  . lisinopril-hydrochlorothiazide (ZESTORETIC) 20-25 MG tablet Take 1 tablet by mouth once daily  . metFORMIN (GLUCOPHAGE) 500 MG tablet Take 1 tablet (500 mg total) by mouth 2 (two) times daily with a meal.   No facility-administered encounter medications on file as of  12/15/2019.    Goals Addressed   None      Follow up Plan: 1. Available to assist with [facility] referral for eye exam  Estanislado Emms, [facility] [phone]

## 2020-01-13 ENCOUNTER — Other Ambulatory Visit: Payer: Self-pay

## 2020-01-13 DIAGNOSIS — E119 Type 2 diabetes mellitus without complications: Secondary | ICD-10-CM

## 2020-01-14 MED ORDER — METFORMIN HCL 500 MG PO TABS: 500.0000 mg | ORAL_TABLET | Freq: Two times a day (BID) | ORAL | 0 refills | Status: DC

## 2020-01-29 ENCOUNTER — Encounter: Payer: PRIVATE HEALTH INSURANCE | Attending: Nurse Practitioner | Admitting: Registered"

## 2020-01-29 ENCOUNTER — Encounter: Payer: Self-pay | Admitting: Registered"

## 2020-01-29 ENCOUNTER — Other Ambulatory Visit: Payer: Self-pay

## 2020-01-29 DIAGNOSIS — E119 Type 2 diabetes mellitus without complications: Secondary | ICD-10-CM | POA: Insufficient documentation

## 2020-01-29 DIAGNOSIS — E118 Type 2 diabetes mellitus with unspecified complications: Secondary | ICD-10-CM | POA: Insufficient documentation

## 2020-01-29 NOTE — Patient Instructions (Signed)
Goals: In an effort to cut back on eating out (specifically fried chicken) you plan 2-3 x per week to make a meals such as bake chicken breast with bread crumbs, 1 cup rice, broccoli.   Instead of drinking grape juice, try eating 2 handfuls of grapes with some nuts.  Increase physical activity: 2x week walk in the morning before you get on the computer.  When looking for a job, consider applying to ones that will not have you working at night when you should be sleeping.  Try some of the sleep hygiene tips for more structured sleep.

## 2020-01-29 NOTE — Progress Notes (Signed)
Diabetes Self-Management Education  Visit Type: First/Initial  Appt. Start Time: 0925 Appt. End Time: 1025  01/29/2020  [name], identified by name and date of birth, is a 44 y.o. male with a diagnosis of Diabetes: Type 2.   ASSESSMENT  There were no vitals taken for this visit. There is no height or weight on file to calculate BMI.   Pt states he wants to lower his A1c as well as make changes to help is blood pressure.  Pt states he does not have a regular sleep schedule and sometimes sleeps until 12. Pt states he spends a lot of time on the computer looking for work and doesn't think about exercise.  Pt reports he lives with his mother and during the dietary recall he would qualify some of his intake "if it is available" but denies food insecurity at this time.  Pt states he only eats a few different vegetables, not much fruit, but he does like fruit juice. Pt states he would like to improve diet by eating less fast food, specifically fried chicken.  Patient has stopped drinking regular soda. Pt states he was told diet soda is not good either, but feels at least he is choosing light instead of dark diet soda.  Pt states he is not willing to prick his finger, RD discussed when the CGM would be indicated and that with his A1c level, pricking his finger at this time may not be necessary, but he needs to make sure to keep going to his regular follow-up visit with MD to keep an eye on blood sugar levels.   Diabetes Self-Management Education - 01/29/20 0938      Visit Information   Visit Type First/Initial      Initial Visit   Diabetes Type Type 2    Are you currently following a meal plan? No    Are you taking your medications as prescribed? Yes   metformin 500 bid   Date Diagnosed sept 2020      Health Coping   How would you rate your overall health? Fair      Psychosocial Assessment   Patient Belief/Attitude about Diabetes Motivated to manage diabetes    Special Needs  Simplified materials    How often do you need to have someone help you when you read instructions, pamphlets, or other written materials from your doctor or pharmacy? 4 - Often    What is the last grade level you completed in school? college      Complications   Last HgB A1C per patient/outside source 6.1 %    How often do you check your blood sugar? 0 times/day (not testing)    Have you had a dilated eye exam in the past 12 months? No   plans to have in near future   Have you had a dental exam in the past 12 months? No    Are you checking your feet? Yes    How many days per week are you checking your feet? 1      Dietary Intake   Breakfast none    Lunch bread or bagel, breakfast meat, sometimes egg, sometimes OJ    Snack (afternoon) chips, nuts, cookies (japanese)    Dinner meat, starch    Snack (evening) same    Beverage(s) diet soda (usually light), juice, 4 bottles water      Exercise   Exercise Type ADL's    How many days per week to you exercise? 0  How many minutes per day do you exercise? 0    Total minutes per week of exercise 0      Patient Education   Previous Diabetes Education No    Nutrition management  Role of diet in the treatment of diabetes and the relationship between the three main macronutrients and blood glucose level    Physical activity and exercise  Role of exercise on diabetes management, blood pressure control and cardiac health.    Monitoring Interpreting lab values - A1C, lipid, urine microalbumina.    Personal strategies to promote health Other (comment)   sleep     Individualized Goals (developed by patient)   Nutrition General guidelines for healthy choices and portions discussed    Physical Activity Exercise 1-2 times per week      Outcomes   Expected Outcomes Demonstrated interest in learning. Expect positive outcomes    Future DMSE 4-6 wks    Program Status Not Completed           Individualized Plan for Diabetes Self-Management  Training:   Learning Objective:  Patient will have a greater understanding of diabetes self-management. Patient education plan is to attend individual and/or group sessions per assessed needs and concerns.   Patient Instructions  Goals: In an effort to cut back on eating out (specifically fried chicken) you plan 2-3 x per week to make a meals such as bake chicken breast with bread crumbs, 1 cup rice, broccoli.   Instead of drinking grape juice, try eating 2 handfuls of grapes with some nuts.  Increase physical activity: 2x week walk in the morning before you get on the computer.  When looking for a job, consider applying to ones that will not have you working at night when you should be sleeping.  Try some of the sleep hygiene tips for more structured sleep.    Expected Outcomes:  Demonstrated interest in learning. Expect positive outcomes  Education material provided: Planning Healthy Meals, A1c chart, Sleep Hygiene  If problems or questions, patient to contact team via:  Phone and MyChart  Future DSME appointment: 4-6 wks

## 2020-02-09 ENCOUNTER — Other Ambulatory Visit: Payer: Self-pay

## 2020-02-09 ENCOUNTER — Encounter (HOSPITAL_COMMUNITY): Payer: Self-pay

## 2020-02-09 ENCOUNTER — Emergency Department (HOSPITAL_COMMUNITY)
Admission: EM | Admit: 2020-02-09 | Discharge: 2020-02-09 | Disposition: A | Discharge status: Home / Self Care | Payer: Self-pay | Attending: Emergency Medicine | Admitting: Emergency Medicine

## 2020-02-09 ENCOUNTER — Emergency Department (HOSPITAL_COMMUNITY): Payer: Self-pay

## 2020-02-09 DIAGNOSIS — J45909 Unspecified asthma, uncomplicated: Secondary | ICD-10-CM | POA: Insufficient documentation

## 2020-02-09 DIAGNOSIS — Z79899 Other long term (current) drug therapy: Secondary | ICD-10-CM | POA: Insufficient documentation

## 2020-02-09 DIAGNOSIS — M25562 Pain in left knee: Secondary | ICD-10-CM | POA: Insufficient documentation

## 2020-02-09 DIAGNOSIS — Z7984 Long term (current) use of oral hypoglycemic drugs: Secondary | ICD-10-CM | POA: Insufficient documentation

## 2020-02-09 DIAGNOSIS — I10 Essential (primary) hypertension: Secondary | ICD-10-CM | POA: Insufficient documentation

## 2020-02-09 DIAGNOSIS — G8929 Other chronic pain: Secondary | ICD-10-CM | POA: Insufficient documentation

## 2020-02-09 DIAGNOSIS — E119 Type 2 diabetes mellitus without complications: Secondary | ICD-10-CM | POA: Insufficient documentation

## 2020-02-09 MED ORDER — CYCLOBENZAPRINE HCL 10 MG PO TABS
10.0000 mg | ORAL_TABLET | Freq: Every day | ORAL | 0 refills | Status: DC
Start: 2020-02-09 — End: 2021-07-31

## 2020-02-09 NOTE — Discharge Instructions (Signed)
I recommend a combination of tylenol and ibuprofen for management of your pain. You can take a low dose of both at the same time. I recommend 325 mg of Tylenol combined with 400 mg of ibuprofen. This is one regular Tylenol and two regular ibuprofen. You can take these 2-3 times for day for your pain. Please try to take these medications with a small amount of food as well to prevent upsetting your stomach.  Also, please consider topical pain relieving creams such as Voltaran Gel, BioFreeze, or Icy Hot. There is also a pain relieving cream made by [employer]. You should be able to find all of these at your local pharmacy.   Please adhere to the "RICE Method" for rehabbing. This stands for "rest, ice, compress, and elevate". Please continue to perform stretches and exercise as much as your pain allows. It is important that you keep moving to help heal your injury.   Please return to the emergency department with any new or worsening symptoms.  It was a pleasure to meet you.

## 2020-02-09 NOTE — ED Triage Notes (Signed)
[place]. Pt states that he has been walking a lot and c/o left knee pain . Pt states pain has gotten worse and describes it as sharp.  HTN meds at night- BP was 220/120 with EMS

## 2020-02-09 NOTE — ED Provider Notes (Signed)
[facility] DEPT Provider Note   CSN: [id number] Arrival date & time: 02/09/20  1246     History Chief Complaint  Patient presents with  . Knee Pain    left    Edward Carr is a 44 y.o. male.  HPI Patient is a 44 year old male with medical history as noted below.  Patient states for the last month he has been experiencing left anterior knee pain when ambulating.  His pain is only mild at rest but worsens to a moderate pain with ambulation.  He describes it as sharp.  He has been wearing a knee brace with mild relief.  He has no other complaints at this time.  No fevers, chills, chest pain, shortness of breath, syncope.     Past Medical History:  Diagnosis Date  . Asthma   . Hypertension     Patient Active Problem List   Diagnosis Date Noted  . Controlled type 2 diabetes mellitus without complication ([place]) 78/29/5621  . Influenza vaccination given 05/22/2017  . Essential hypertension 08/22/2016  . Asthma 08/22/2016  . Eczema 08/22/2016    Past Surgical History:  Procedure Laterality Date  . DENTAL SURGERY         Family History  Problem Relation Age of Onset  . Diabetes Mother   . Cancer Father   . Cancer Sister     Social History   Tobacco Use  . Smoking status: Never Smoker  . Smokeless tobacco: Never Used  Vaping Use  . Vaping Use: Never used  Substance Use Topics  . Alcohol use: No  . Drug use: No    Home Medications Prior to Admission medications   Medication Sig Start Date End Date Taking? Authorizing Provider  albuterol (VENTOLIN HFA) 108 (90 Base) MCG/ACT inhaler Inhale 1 puff into the lungs every 6 (six) hours as needed for wheezing or shortness of breath. 11/24/19   Dorena Dew, FNP  amLODipine (NORVASC) 10 MG tablet Take 1 tablet by mouth once daily 08/11/19   Dorena Dew, FNP  cloNIDine (CATAPRES) 0.1 MG tablet Take 0.5 tablets (0.05 mg total) by mouth at bedtime. X 2 weeks. Then 1/2 pill by mouth  every other night x 2 weeks then d/c 01/02/19   Lanae Boast, FNP  lisinopril-hydrochlorothiazide (ZESTORETIC) 20-25 MG tablet Take 1 tablet by mouth once daily 08/24/19   Dorena Dew, FNP  metFORMIN (GLUCOPHAGE) 500 MG tablet Take 1 tablet (500 mg total) by mouth 2 (two) times daily with a meal. 01/14/20   Vevelyn Francois, NP    Allergies    Patient has no known allergies.  Review of Systems   Review of Systems  Constitutional: Negative for chills and fever.  Respiratory: Negative for shortness of breath.   Cardiovascular: Negative for chest pain.  Musculoskeletal: Positive for arthralgias.  Skin: Negative for color change and wound.  Neurological: Negative for weakness and numbness.   Physical Exam Updated Vital Signs BP (!) 183/134   Pulse (!) 102   Temp 100 F (37.8 C) (Oral)   Resp 16   Ht 5\' 5"  (1.651 m)   Wt (!) 142 kg   SpO2 95%   BMI 52.09 kg/m   Physical Exam Vitals and nursing note reviewed.  Constitutional:      General: He is not in acute distress.    Appearance: Normal appearance. He is not ill-appearing, toxic-appearing or diaphoretic.  HENT:     Head: Normocephalic and atraumatic.  Right Ear: External ear normal.     Left Ear: External ear normal.     Nose: Nose normal.     Mouth/Throat:     Mouth: Mucous membranes are moist.     Pharynx: Oropharynx is clear. No oropharyngeal exudate or posterior oropharyngeal erythema.  Eyes:     Extraocular Movements: Extraocular movements intact.  Cardiovascular:     Rate and Rhythm: Normal rate.     Pulses: Normal pulses.  Pulmonary:     Effort: Pulmonary effort is normal. No respiratory distress.     Breath sounds: No stridor.  Abdominal:     General: Abdomen is flat.  Musculoskeletal:        General: Normal range of motion.     Cervical back: Normal range of motion and neck supple. No tenderness.     Comments: Mild TTP appreciated along the left anterior knee.  Negative anterior and posterior drawer  of the left leg.  Patient is able to ambulate with a steady gait.  Skin:    General: Skin is warm and dry.  Neurological:     General: No focal deficit present.     Mental Status: He is alert and oriented to person, place, and time.     Comments: Distal sensation intact in the bilateral lower extremities.  Psychiatric:        Mood and Affect: Mood normal.        Behavior: Behavior normal.    ED Results / Procedures / Treatments   Labs (all labs ordered are listed, but only abnormal results are displayed) Labs Reviewed - No data to display  EKG None  Radiology DG Knee Complete 4 Views Left  Result Date: 02/09/2020 CLINICAL DATA:  Left knee pain for the past month. EXAM: LEFT KNEE - COMPLETE 4+ VIEW COMPARISON:  None. FINDINGS: No evidence of fracture, dislocation, or joint effusion. No evidence of arthropathy or other focal bone abnormality. Soft tissues are unremarkable. IMPRESSION: Negative. Electronically Signed   By: Titus Dubin M.D.   On: 02/09/2020 13:37   Procedures Procedures (including critical care time)  Medications Ordered in ED Medications - No data to display  ED Course  I have reviewed the triage vital signs and the nursing notes.  Pertinent labs & imaging results that were available during my care of the patient were reviewed by me and considered in my medical decision making (see chart for details).  Clinical Course as of Feb 09 2007  Tue Feb 09, 2020  1932 No acute abnormalities.  DG Knee Complete 4 Views Left [LJ]    Clinical Course User Index [LJ] Rayna Sexton, PA-C   MDM Rules/Calculators/A&P                          Pt is a 44 y.o. male that present with a history, physical exam, ED Clinical Course as noted above.   Patient presents with left knee pain.  Symptoms seem musculoskeletal in nature.  Physical exam is reassuring.  No signs or symptoms consistent with septic joint.  Patient can ambulate with a steady gait.  Neurological exam is  benign.  Recommended the RICE method.  Discussed continued use of Tylenol and ibuprofen for management of his pain.  We discussed dosing.  Patient has a knee brace and I recommended that he continue wearing it.  Recommended that he follow-up with his PCP if his symptoms do not improve in 2 to 3 weeks.  His  questions were answered and he was amicable to time of discharge.  Patient has been hypertensive at this visit but typically takes his hypertension medications at night.   Patient discharged to home/self care.  Condition at discharge: Stable  Note: Portions of this report may have been transcribed using voice recognition software. Every effort was made to ensure accuracy; however, inadvertent computerized transcription errors may be present.   Final Clinical Impression(s) / ED Diagnoses Final diagnoses:  Chronic pain of left knee    Rx / DC Orders ED Discharge Orders         Ordered    cyclobenzaprine (FLEXERIL) 10 MG tablet  Daily at bedtime     Discontinue  Reprint     02/09/20 2007           Rayna Sexton, PA-C 02/09/20 2029    Lajean Saver, MD 02/09/20 2116

## 2020-02-23 ENCOUNTER — Encounter: Payer: Self-pay | Admitting: Family Medicine

## 2020-02-23 ENCOUNTER — Ambulatory Visit (INDEPENDENT_AMBULATORY_CARE_PROVIDER_SITE_OTHER): Payer: PRIVATE HEALTH INSURANCE | Admitting: Family Medicine

## 2020-02-23 ENCOUNTER — Other Ambulatory Visit: Payer: Self-pay

## 2020-02-23 VITALS — BP 144/98 | Temp 98.8°F | Resp 18 | Ht 65.0 in | Wt 312.0 lb

## 2020-02-23 DIAGNOSIS — M25562 Pain in left knee: Secondary | ICD-10-CM

## 2020-02-23 DIAGNOSIS — Z1159 Encounter for screening for other viral diseases: Secondary | ICD-10-CM

## 2020-02-23 DIAGNOSIS — E119 Type 2 diabetes mellitus without complications: Secondary | ICD-10-CM

## 2020-02-23 DIAGNOSIS — G8929 Other chronic pain: Secondary | ICD-10-CM

## 2020-02-23 LAB — POCT URINALYSIS DIPSTICK
Bilirubin, UA: NEGATIVE
Blood, UA: NEGATIVE
Glucose, UA: NEGATIVE
Ketones, UA: NEGATIVE
Leukocytes, UA: NEGATIVE
Nitrite, UA: NEGATIVE
Protein, UA: NEGATIVE
Spec Grav, UA: 1.02 (ref 1.010–1.025)
Urobilinogen, UA: 0.2 E.U./dL
pH, UA: 5.5 (ref 5.0–8.0)

## 2020-02-23 LAB — POCT GLYCOSYLATED HEMOGLOBIN (HGB A1C): Hemoglobin A1C: 6 % — AB (ref 4.0–5.6)

## 2020-02-23 MED ORDER — METFORMIN HCL 500 MG PO TABS: 500.0000 mg | ORAL_TABLET | Freq: Two times a day (BID) | ORAL | 1 refills | Status: DC

## 2020-02-23 NOTE — Progress Notes (Signed)
[facility]   Established Patient Office Visit  Subjective:  Patient ID: Edward Carr, male    DOB: 1975-11-27  Age: 44 y.o. MRN: [id number]  CC:  Chief Complaint  Patient presents with  . Hypertension  . Diabetes  . Knee Pain    left     HPI Edward Carr is a very pleasant 44 year old male with a medical history significant for type 2 diabetes mellitus, essential hypertension, morbid obesity, and mild intermittent asthma presents for follow-up of hypertension and diabetes.  He states that he has mostly been doing well and has minimal complaints.  His only complaint on today is periodic left knee pain.  He states that left knee pain is worsened with ambulation, long periods of standing, and transitioning from sitting to standing.  Patient has complained of this problem in the past and was advised to decrease weight to help with this problem.  He says that he has not attempted any over-the-counter analgesics or anti-inflammatories to help with pain.  He is not having pain at present.  He says that he periodically utilizes a cane to help with ambulation. Patient says that he has been taking all medications consistently.  He does not check blood glucose or blood pressure at home.  He says that he is not been following a carbohydrate modified diet.  Also, he drinks soft drinks occasionally.  He denies any chest pain, shortness of breath, urinary symptoms, increased hunger, increased thirst, or increased urination.   Past Medical History:  Diagnosis Date  . Asthma   . Hypertension     Past Surgical History:  Procedure Laterality Date  . DENTAL SURGERY      Family History  Problem Relation Age of Onset  . Diabetes Mother   . Cancer Father   . Cancer Sister     Social History   Socioeconomic History  . Marital status: Single    Spouse name: Not on file  . Number of children: Not on file  . Years of education: Not on file  .  Highest education level: Not on file  Occupational History  . Not on file  Tobacco Use  . Smoking status: Never Smoker  . Smokeless tobacco: Never Used  Vaping Use  . Vaping Use: Never used  Substance and Sexual Activity  . Alcohol use: No  . Drug use: No  . Sexual activity: Never  Other Topics Concern  . Not on file  Social History Narrative  . Not on file   Social Determinants of Health   Financial Resource Strain:   . Difficulty of Paying Living Expenses:   Food Insecurity:   . Worried About Charity fundraiser in the Last Year:   . Arboriculturist in the Last Year:   Transportation Needs:   . Film/video editor (Medical):   Marland Kitchen Lack of Transportation (Non-Medical):   Physical Activity:   . Days of Exercise per Week:   . Minutes of Exercise per Session:   Stress:   . Feeling of Stress :   Social Connections:   . Frequency of Communication with Friends and Family:   . Frequency of Social Gatherings with Friends and Family:   . Attends Religious Services:   . Active Member of Clubs or Organizations:   . Attends Archivist Meetings:   Marland Kitchen Marital Status:   Intimate Partner Violence:   . Fear of Current or Ex-Partner:   . Emotionally  Abused:   Marland Kitchen Physically Abused:   . Sexually Abused:     Outpatient Medications Prior to Visit  Medication Sig Dispense Refill  . albuterol (VENTOLIN HFA) 108 (90 Base) MCG/ACT inhaler Inhale 1 puff into the lungs every 6 (six) hours as needed for wheezing or shortness of breath. 8 g 1  . amLODipine (NORVASC) 10 MG tablet Take 1 tablet by mouth once daily 90 tablet 0  . cloNIDine (CATAPRES) 0.1 MG tablet Take 0.5 tablets (0.05 mg total) by mouth at bedtime. X 2 weeks. Then 1/2 pill by mouth every other night x 2 weeks then d/c 30 tablet 0  . lisinopril-hydrochlorothiazide (ZESTORETIC) 20-25 MG tablet Take 1 tablet by mouth once daily 90 tablet 0  . metFORMIN (GLUCOPHAGE) 500 MG tablet Take 1 tablet (500 mg total) by mouth 2  (two) times daily with a meal. 60 tablet 0  . cyclobenzaprine (FLEXERIL) 10 MG tablet Take 1 tablet (10 mg total) by mouth at bedtime. (Patient not taking: Reported on 02/23/2020) 5 tablet 0   No facility-administered medications prior to visit.    No Known Allergies  ROS Review of Systems  Constitutional: Negative for fatigue and fever.  HENT: Negative.   Eyes: Negative.   Respiratory: Negative.   Cardiovascular: Negative.   Gastrointestinal: Negative.   Endocrine: Positive for polyuria. Negative for polydipsia and polyphagia.  Genitourinary: Negative.   Musculoskeletal: Negative.   Skin: Negative.   Allergic/Immunologic: Negative.   Neurological: Negative.   Hematological: Negative.   Psychiatric/Behavioral: Negative.       Objective:    Physical Exam Constitutional:      Appearance: Normal appearance.  Eyes:     Pupils: Pupils are equal, round, and reactive to light.  Cardiovascular:     Rate and Rhythm: Normal rate and regular rhythm.     Pulses: Normal pulses.  Pulmonary:     Effort: Pulmonary effort is normal.  Abdominal:     General: Abdomen is flat. Bowel sounds are normal.  Skin:    General: Skin is warm.  Neurological:     General: No focal deficit present.     Mental Status: He is alert. Mental status is at baseline.  Psychiatric:        Mood and Affect: Mood normal.        Behavior: Behavior normal.        Thought Content: Thought content normal.        Judgment: Judgment normal.     BP (!) 144/98 (BP Location: Left Arm, Patient Position: Sitting, Cuff Size: Large)   Temp 98.8 F (37.1 C) (Oral)   Resp 18   Ht 5\' 5"  (1.651 m)   Wt (!) 312 lb (141.5 kg)   SpO2 97%   BMI 51.92 kg/m  Wt Readings from Last 3 Encounters:  02/23/20 (!) 312 lb (141.5 kg)  02/09/20 (!) 313 lb 0.9 oz (142 kg)  12/11/19 (!) 315 lb (142.9 kg)     Health Maintenance Due  Topic Date Due  . Hepatitis C Screening  Never done  . PNEUMOCOCCAL POLYSACCHARIDE VACCINE  AGE 53-64 HIGH RISK  Never done  . OPHTHALMOLOGY EXAM  Never done  . COVID-19 Vaccine (2 - Pfizer 2-dose series) 12/05/2019    There are no preventive care reminders to display for this patient.  Lab Results  Component Value Date   TSH 1.430 12/11/2019   Lab Results  Component Value Date   WBC 10.3 12/11/2019   HGB 13.7 12/11/2019  HCT 40.7 12/11/2019   MCV 82 12/11/2019   PLT 395 12/11/2019   Lab Results  Component Value Date   NA 139 12/11/2019   K 4.0 12/11/2019   CO2 23 04/15/2019   GLUCOSE 120 (H) 12/11/2019   BUN 13 12/11/2019   CREATININE 1.27 12/11/2019   BILITOT 0.4 12/11/2019   ALKPHOS 67 12/11/2019   AST 15 12/11/2019   ALT 23 04/15/2019   PROT 7.3 12/11/2019   ALBUMIN 4.4 12/11/2019   CALCIUM 9.9 12/11/2019   Lab Results  Component Value Date   CHOL 149 04/15/2019   Lab Results  Component Value Date   HDL 31 (L) 04/15/2019   Lab Results  Component Value Date   LDLCALC 92 04/15/2019   Lab Results  Component Value Date   TRIG 146 04/15/2019   Lab Results  Component Value Date   CHOLHDL 4.8 04/15/2019   Lab Results  Component Value Date   HGBA1C 6.0 (A) 02/23/2020      Assessment & Plan:   Problem List Items Addressed This Visit    None    Visit Diagnoses    Type 2 diabetes mellitus without complication, without long-term current use of insulin (HCC)    -  Primary   Relevant Medications   metFORMIN (GLUCOPHAGE) 500 MG tablet   Other Relevant Orders   HgB A1c (Completed)   Urinalysis Dipstick   Type 2 diabetes mellitus not at goal [facility])       Relevant Medications   metFORMIN (GLUCOPHAGE) 500 MG tablet      Meds ordered this encounter  Medications  . metFORMIN (GLUCOPHAGE) 500 MG tablet    Sig: Take 1 tablet (500 mg total) by mouth 2 (two) times daily with a meal.    Dispense:  180 tablet    Refill:  1    Order Specific Question:   Supervising Provider    Answer:   Tresa Garter [[id number]]   1. Type 2 diabetes  mellitus without complication, without long-term current use of insulin (HCC) Hemoglobin A1c continues to be at goal.  No medication changes warranted on today.  Patient counseled extensively concerning diet and exercise.  He expressed understanding. - HgB A1c - Urinalysis Dipstick - metFORMIN (GLUCOPHAGE) 500 MG tablet; Take 1 tablet (500 mg total) by mouth 2 (two) times daily with a meal.  Dispense: 180 tablet; Refill: 1  2. Need for hepatitis C screening test  - Hepatitis panel, acute  3. Chronic pain of left knee Patient asked to increase efforts to decrease weight.  Extra pounds increase the stress on your knees, which can cause chronic pain and lead to other knee-related complications, such as arthritis or osteoarthritis. Regular exercise can lessen and alleviate overweight and obesity-related knee pain, stiffness, and swellingAlso, recommend over-the-counter Tylenol as directed for this problem.   4. Morbid obesity ([place]) Body mass index is 51.92 kg/m. The patient is asked to make an attempt to improve diet and exercise patterns to aid in medical management of this problem.   Follow-up: 3 months for chronic conditions   Crystal Lawns  APRN, MSN, FNP-C [facility] [address] [place], [place] [address] [phone]

## 2020-02-24 LAB — HEPATITIS PANEL, ACUTE
Hep A IgM: NEGATIVE
Hep B C IgM: NEGATIVE
Hep C Virus Ab: <0.1 s/co ratio (ref 0.0–0.9)
Hepatitis B Surface Ag: NEGATIVE

## 2020-03-10 ENCOUNTER — Encounter: Payer: Self-pay | Attending: Nurse Practitioner | Admitting: Dietician

## 2020-03-10 ENCOUNTER — Encounter: Payer: Self-pay | Admitting: Dietician

## 2020-03-10 ENCOUNTER — Other Ambulatory Visit: Payer: Self-pay

## 2020-03-10 DIAGNOSIS — E119 Type 2 diabetes mellitus without complications: Secondary | ICD-10-CM

## 2020-03-10 NOTE — Patient Instructions (Signed)
Take a portion of snack food in a bowl with you instead of the whole box. Try fruit and nuts for a snack. Eat your snack items one at a time.   Try half juice and half water.  Try Sprite Zero  Try to eat three consistent meals a day  Keep up the great work!

## 2020-03-10 NOTE — Progress Notes (Signed)
Diabetes Self-Management Education  Visit Type: Follow-up  Appt. Start Time: 10:30 Appt. End Time: 11:10  03/10/2020  [name], identified by name and date of birth, is a 44 y.o. male with a diagnosis of Diabetes: Type 2.   ASSESSMENT Pt reports losing weight since his last visit, and that he does not eat as much.  Pt reports having success adding new foods, like baked chicken with panko bread crumbs to replace fried chicken sometimes. Pt reports looking for new vegetables to try, considering radishes. Pt reports not being able to sleep as well as he would like to, or as much as he would like..  Reports only sleeping 2 hours a day this past week, but it usually cycles back.  Pt is not stressed, and does not want to consider prescription sleep aids. Pt is still looking for employment, but it must not require ambulatory work. Pt is currently using a cane, having problems with a knee. It is keeping him from any activity. Pt reports not wanting to eat lunch, has no desire to eat at that time of the day. Pt reports snacking while occupied and eating the entire box. Next A1c reading is in October  Height 5\' 5"  (1.651 m), weight (!) 309 lb 9.6 oz (140.4 kg). Body mass index is 51.52 kg/m.   Diabetes Self-Management Education - 03/10/20 1256      Visit Information   Visit Type Follow-up      Initial Visit   Diabetes Type Type 2    Are you currently following a meal plan? No    Are you taking your medications as prescribed? Yes    Date Diagnosed September 2020      Health Coping   How would you rate your overall health? Good      Psychosocial Assessment   Patient Belief/Attitude about Diabetes Motivated to manage diabetes    Self-care barriers None      Complications   Last HgB A1C per patient/outside source 6 %      Dietary Intake   Breakfast 9 frozen chicken nuggets, 16 oz sprite    Snack (morning) Crackers/pringles    Lunch None reported    Snack (afternoon)  crackers/pringles    Dinner Chop steak, baked potato with butter, broccoli and cheese    Snack (evening) none    Beverage(s) water, sprite      Exercise   Exercise Type ADL's      Patient Education   Previous Diabetes Education Yes (please comment)    Nutrition management  Meal options for control of blood glucose level and chronic complications.    Physical activity and exercise  Other (comment)   Pt currently using cane, has knee pain   Medications Reviewed patients medication for diabetes, action, purpose, timing of dose and side effects.    Monitoring Interpreting lab values - A1C, lipid, urine microalbumina.   Pt is not SMBG, educate on A1c   Personal strategies to promote health Lifestyle issues that need to be addressed for better diabetes care      Individualized Goals (developed by patient)   Nutrition Follow meal plan discussed;General guidelines for healthy choices and portions discussed    Medications take my medication as prescribed    Monitoring  Other (comment)   Follow up after next A1c in two months     Patient Self-Evaluation of Goals - Patient rates self as meeting previously set goals (% of time)   Nutrition 50 - 75 %  Physical Activity 25 - 50%    Medications 25 - 50%    Monitoring < 25%    Problem Solving 50 - 75 %    Reducing Risk 25 - 50%    Health Coping 25 - 50%      Post-Education Assessment   Patient understands the diabetes disease and treatment process. Needs Review    Patient understands incorporating nutritional management into lifestyle. Needs Review    Patient undertands incorporating physical activity into lifestyle. Needs Review    Patient understands using medications safely. Demonstrates understanding / competency    Patient understands monitoring blood glucose, interpreting and using results Demonstrates understanding / competency   Pt doesn not self monitor, educated on A1c   Patient understands prevention, detection, and treatment of  acute complications. Needs Review    Patient understands prevention, detection, and treatment of chronic complications. Needs Review    Patient understands how to develop strategies to address psychosocial issues. Needs Review    Patient understands how to develop strategies to promote health/change behavior. Needs Review      Outcomes   Expected Outcomes Demonstrated interest in learning. Expect positive outcomes    Future DMSE 3-4 months    Program Status Not Completed      Subsequent Visit   Since your last visit have you continued or begun to take your medications as prescribed? Yes    Since your last visit have you had your blood pressure checked? --   unable to ask   Since your last visit have you experienced any weight changes? Loss    Weight Loss (lbs) --   Pt did not specify, no previous wt recorded from inital DSME   Since your last visit, are you checking your blood glucose at least once a day? No           Individualized Plan for Diabetes Self-Management Training:   Learning Objective:  Patient will have a greater understanding of diabetes self-management. Patient education plan is to attend individual and/or group sessions per assessed needs and concerns.   Plan:   Patient Instructions  Take a portion of snack food in a bowl with you instead of the whole box. Try fruit and nuts for a snack. Eat your snack items one at a time.   Try half juice and half water.  Try Sprite Zero  Try to eat three consistent meals a day  Keep up the great work!   Expected Outcomes:  Demonstrated interest in learning. Expect positive outcomes   If problems or questions, patient to contact team via:  Phone and Email  Future DSME appointment: 3-4 months

## 2020-03-11 ENCOUNTER — Ambulatory Visit: Payer: PRIVATE HEALTH INSURANCE | Admitting: Registered"

## 2020-05-24 ENCOUNTER — Ambulatory Visit: Payer: Self-pay | Admitting: Family Medicine

## 2020-06-02 ENCOUNTER — Ambulatory Visit: Payer: Self-pay | Admitting: Dietician

## 2020-06-07 ENCOUNTER — Ambulatory Visit (INDEPENDENT_AMBULATORY_CARE_PROVIDER_SITE_OTHER): Payer: Self-pay | Admitting: Family Medicine

## 2020-06-07 ENCOUNTER — Other Ambulatory Visit: Payer: Self-pay

## 2020-06-07 ENCOUNTER — Encounter: Payer: Self-pay | Admitting: Family Medicine

## 2020-06-07 VITALS — BP 142/94 | HR 86 | Temp 98.4°F | Ht 65.0 in | Wt 314.8 lb

## 2020-06-07 DIAGNOSIS — D171 Benign lipomatous neoplasm of skin and subcutaneous tissue of trunk: Secondary | ICD-10-CM

## 2020-06-07 DIAGNOSIS — I10 Essential (primary) hypertension: Secondary | ICD-10-CM

## 2020-06-07 DIAGNOSIS — E119 Type 2 diabetes mellitus without complications: Secondary | ICD-10-CM

## 2020-06-07 DIAGNOSIS — Z0000 Encounter for general adult medical examination without abnormal findings: Secondary | ICD-10-CM

## 2020-06-07 DIAGNOSIS — Z23 Encounter for immunization: Secondary | ICD-10-CM

## 2020-06-07 LAB — POCT GLYCOSYLATED HEMOGLOBIN (HGB A1C)
HbA1c POC (<> result, manual entry): 6 % (ref 4.0–5.6)
HbA1c, POC (controlled diabetic range): 6 % (ref 0.0–7.0)
HbA1c, POC (prediabetic range): 6 % (ref 5.7–6.4)
Hemoglobin A1C: 6 % — AB (ref 4.0–5.6)

## 2020-06-07 LAB — POCT URINALYSIS DIPSTICK
Bilirubin, UA: NEGATIVE
Blood, UA: NEGATIVE
Glucose, UA: NEGATIVE
Ketones, UA: NEGATIVE
Leukocytes, UA: NEGATIVE
Nitrite, UA: NEGATIVE
Protein, UA: NEGATIVE
Spec Grav, UA: 1.025 (ref 1.010–1.025)
Urobilinogen, UA: 0.2 E.U./dL
pH, UA: 5 (ref 5.0–8.0)

## 2020-06-07 LAB — GLUCOSE, POCT (MANUAL RESULT ENTRY): POC Glucose: 100 mg/dl — AB (ref 70–99)

## 2020-06-07 MED ORDER — AMLODIPINE BESYLATE 10 MG PO TABS: 10.0000 mg | ORAL_TABLET | Freq: Every day | ORAL | 1 refills | Status: DC

## 2020-06-07 MED ORDER — LISINOPRIL-HYDROCHLOROTHIAZIDE 20-25 MG PO TABS: 1.0000 | ORAL_TABLET | Freq: Every day | ORAL | 1 refills | Status: DC

## 2020-06-07 NOTE — Progress Notes (Signed)
[facility]   Established Patient Office Visit  Subjective:  Patient ID: Rajveer Handler, male    DOB: December 30, 1975  Age: 44 y.o. MRN: [id number]  CC:  Chief Complaint  Patient presents with  . Follow-up    HPI Edward Carr is a 44 year old male with a medical history significant for type 2 diabetes mellitus, essential hypertension, and morbid obesity that presents for a 66-month follow-up of chronic conditions. Patient says he has not been following a low-fat, low carbohydrate diet.  He has been eating sweets over the past several months.  He typically prepares his own meals.  He has been taking all medications consistently and without interruptions.  He does not check blood glucose at home.  Also, patient does not check blood pressure at home.  He does not exercise consistently.  He states that he typically stays active on most days. Patient is complaining of a skin eruption underneath the left breast that has been there for the past 3 to 4 months.  It is nontender, no redness, and no drainage.  Patient states that he noticed eruption during shower.  He has not attempted any interventions to alleviate problem.  Past Medical History:  Diagnosis Date  . Asthma   . Hypertension     Past Surgical History:  Procedure Laterality Date  . DENTAL SURGERY      Family History  Problem Relation Age of Onset  . Diabetes Mother   . Cancer Father   . Cancer Sister     Social History   Socioeconomic History  . Marital status: Single    Spouse name: Not on file  . Number of children: Not on file  . Years of education: Not on file  . Highest education level: Not on file  Occupational History  . Not on file  Tobacco Use  . Smoking status: Never Smoker  . Smokeless tobacco: Never Used  Vaping Use  . Vaping Use: Never used  Substance and Sexual Activity  . Alcohol use: No  . Drug use: No  . Sexual activity: Never  Other Topics Concern    . Not on file  Social History Narrative  . Not on file   Social Determinants of Health   Financial Resource Strain:   . Difficulty of Paying Living Expenses: Not on file  Food Insecurity:   . Worried About Charity fundraiser in the Last Year: Not on file  . Ran Out of Food in the Last Year: Not on file  Transportation Needs:   . Lack of Transportation (Medical): Not on file  . Lack of Transportation (Non-Medical): Not on file  Physical Activity:   . Days of Exercise per Week: Not on file  . Minutes of Exercise per Session: Not on file  Stress:   . Feeling of Stress : Not on file  Social Connections:   . Frequency of Communication with Friends and Family: Not on file  . Frequency of Social Gatherings with Friends and Family: Not on file  . Attends Religious Services: Not on file  . Active Member of Clubs or Organizations: Not on file  . Attends Archivist Meetings: Not on file  . Marital Status: Not on file  Intimate Partner Violence:   . Fear of Current or Ex-Partner: Not on file  . Emotionally Abused: Not on file  . Physically Abused: Not on file  . Sexually Abused: Not on file    Outpatient Medications  Prior to Visit  Medication Sig Dispense Refill  . albuterol (VENTOLIN HFA) 108 (90 Base) MCG/ACT inhaler Inhale 1 puff into the lungs every 6 (six) hours as needed for wheezing or shortness of breath. 8 g 1  . cloNIDine (CATAPRES) 0.1 MG tablet Take 0.5 tablets (0.05 mg total) by mouth at bedtime. X 2 weeks. Then 1/2 pill by mouth every other night x 2 weeks then d/c 30 tablet 0  . cyclobenzaprine (FLEXERIL) 10 MG tablet Take 1 tablet (10 mg total) by mouth at bedtime. (Patient not taking: Reported on 02/23/2020) 5 tablet 0  . metFORMIN (GLUCOPHAGE) 500 MG tablet Take 1 tablet (500 mg total) by mouth 2 (two) times daily with a meal. 180 tablet 1  . amLODipine (NORVASC) 10 MG tablet Take 1 tablet by mouth once daily 90 tablet 0  . lisinopril-hydrochlorothiazide  (ZESTORETIC) 20-25 MG tablet Take 1 tablet by mouth once daily 90 tablet 0   No facility-administered medications prior to visit.    No Known Allergies  ROS Review of Systems  Constitutional: Negative for fatigue, fever and unexpected weight change.  HENT: Negative.   Eyes: Negative.  Negative for visual disturbance.  Respiratory: Negative.   Cardiovascular: Negative.   Endocrine: Negative for polydipsia, polyphagia and polyuria.  Genitourinary: Negative.   Musculoskeletal: Negative.   Skin:       Swelling underneath left breast  Allergic/Immunologic: Negative.   Neurological: Negative.   Psychiatric/Behavioral: Negative.       Objective:    Physical Exam Constitutional:      Appearance: He is obese.  Eyes:     Pupils: Pupils are equal, round, and reactive to light.  Cardiovascular:     Rate and Rhythm: Normal rate and regular rhythm.     Pulses: Normal pulses.     Heart sounds: Normal heart sounds.  Pulmonary:     Effort: Pulmonary effort is normal.     Breath sounds: Normal breath sounds.  Abdominal:     General: Abdomen is flat. Bowel sounds are normal.  Skin:    Comments: Left breast tissue, lipoma "pea sized" round, raised, and movable Intertrigo present  Neurological:     General: No focal deficit present.     Mental Status: Mental status is at baseline.  Psychiatric:        Mood and Affect: Mood normal.        Behavior: Behavior normal.        Thought Content: Thought content normal.        Judgment: Judgment normal.     BP (!) 142/94 (BP Location: Left Arm, Patient Position: Sitting, Cuff Size: Large)   Pulse 86   Temp 98.4 F (36.9 C)   Ht 5\' 5"  (1.651 m)   Wt (!) 314 lb 12.8 oz (142.8 kg)   BMI 52.39 kg/m  Wt Readings from Last 3 Encounters:  06/07/20 (!) 314 lb 12.8 oz (142.8 kg)  03/10/20 (!) 309 lb 9.6 oz (140.4 kg)  02/23/20 (!) 312 lb (141.5 kg)     Health Maintenance Due  Topic Date Due  . PNEUMOCOCCAL POLYSACCHARIDE VACCINE AGE  54-64 HIGH RISK  Never done  . OPHTHALMOLOGY EXAM  Never done    There are no preventive care reminders to display for this patient.  Lab Results  Component Value Date   TSH 1.430 12/11/2019   Lab Results  Component Value Date   WBC 10.3 12/11/2019   HGB 13.7 12/11/2019   HCT 40.7 12/11/2019  MCV 82 12/11/2019   PLT 395 12/11/2019   Lab Results  Component Value Date   NA 139 12/11/2019   K 4.0 12/11/2019   CO2 23 04/15/2019   GLUCOSE 120 (H) 12/11/2019   BUN 13 12/11/2019   CREATININE 1.27 12/11/2019   BILITOT 0.4 12/11/2019   ALKPHOS 67 12/11/2019   AST 15 12/11/2019   ALT 23 04/15/2019   PROT 7.3 12/11/2019   ALBUMIN 4.4 12/11/2019   CALCIUM 9.9 12/11/2019   Lab Results  Component Value Date   CHOL 149 04/15/2019   Lab Results  Component Value Date   HDL 31 (L) 04/15/2019   Lab Results  Component Value Date   LDLCALC 92 04/15/2019   Lab Results  Component Value Date   TRIG 146 04/15/2019   Lab Results  Component Value Date   CHOLHDL 4.8 04/15/2019   Lab Results  Component Value Date   HGBA1C 6.0 (A) 02/23/2020      Assessment & Plan:   Problem List Items Addressed This Visit      Cardiovascular and Mediastinum   Essential hypertension   Relevant Medications   lisinopril-hydrochlorothiazide (ZESTORETIC) 20-25 MG tablet   amLODipine (NORVASC) 10 MG tablet   Other Relevant Orders   Comprehensive metabolic panel    Other Visit Diagnoses    Type 2 diabetes mellitus without complication, without long-term current use of insulin (HCC)    -  Primary   Relevant Medications   lisinopril-hydrochlorothiazide (ZESTORETIC) 20-25 MG tablet   Other Relevant Orders   Urinalysis Dipstick   POC Glucose (CBG)   POC HgB A1c   Comprehensive metabolic panel      Meds ordered this encounter  Medications  . lisinopril-hydrochlorothiazide (ZESTORETIC) 20-25 MG tablet    Sig: Take 1 tablet by mouth daily.    Dispense:  90 tablet    Refill:  1    Order  Specific Question:   Supervising Provider    Answer:   Tresa Garter [id number]  . amLODipine (NORVASC) 10 MG tablet    Sig: Take 1 tablet (10 mg total) by mouth daily.    Dispense:  90 tablet    Refill:  1    Order Specific Question:   Supervising Provider    Answer:   Angelica Chessman E [[id number]]   4. Type 2 diabetes mellitus without complication, without long-term current use of insulin (HCC) - Urinalysis Dipstick - POC Glucose (CBG) - POC HgB A1c - Comprehensive metabolic panel  2. Essential hypertension BP (!) 142/94 (BP Location: Left Arm, Patient Position: Sitting, Cuff Size: Large)   Pulse 86   Temp 98.4 F (36.9 C)   Ht 5\' 5"  (1.651 m)   Wt (!) 314 lb 12.8 oz (142.8 kg)   BMI 52.39 kg/m  - lisinopril-hydrochlorothiazide (ZESTORETIC) 20-25 MG tablet; Take 1 tablet by mouth daily.  Dispense: 90 tablet; Refill: 1 - amLODipine (NORVASC) 10 MG tablet; Take 1 tablet (10 mg total) by mouth daily.  Dispense: 90 tablet; Refill: 1 - Comprehensive metabolic panel  3. Healthcare maintenance - Pneumococcal conjugate vaccine 13-valent IM  4. Lipoma of anterior chest wall Continue surveillance of lipoma of left chest.  No further interventions warranted at this time. Follow-up: Return in about 3 months (around 09/07/2020).   [name]  APRN, MSN, FNP-C [facility] [address] [place], [place] [address] [phone]

## 2020-06-08 LAB — COMPREHENSIVE METABOLIC PANEL
ALT: 20 IU/L (ref 0–44)
AST: 16 IU/L (ref 0–40)
Albumin/Globulin Ratio: 1.4 (ref 1.2–2.2)
Albumin: 4.6 g/dL (ref 4.0–5.0)
Alkaline Phosphatase: 87 IU/L (ref 44–121)
BUN/Creatinine Ratio: 12 (ref 9–20)
BUN: 14 mg/dL (ref 6–24)
Bilirubin Total: 0.3 mg/dL (ref 0.0–1.2)
CO2: 25 mmol/L (ref 20–29)
Calcium: 10.2 mg/dL (ref 8.7–10.2)
Chloride: 97 mmol/L (ref 96–106)
Creatinine, Ser: 1.15 mg/dL (ref 0.76–1.27)
GFR calc Af Amer: 91 mL/min/{1.73_m2} (ref >59)
GFR calc non Af Amer: 79 mL/min/{1.73_m2} (ref >59)
Globulin, Total: 3.4 g/dL (ref 1.5–4.5)
Glucose: 91 mg/dL (ref 65–99)
Potassium: 4 mmol/L (ref 3.5–5.2)
Sodium: 138 mmol/L (ref 134–144)
Total Protein: 8 g/dL (ref 6.0–8.5)

## 2020-06-10 ENCOUNTER — Telehealth (HOSPITAL_COMMUNITY): Payer: Self-pay | Admitting: Family Medicine

## 2020-06-10 NOTE — Telephone Encounter (Signed)
Edward Carr is a 44 year old male with a medical history significant for essential hypertension and type 2 diabetes mellitus that presented on 06/08/2020 for follow-up of chronic conditions. Notify patient to inform him that all laboratory values are largely consistent with his baseline.  We will follow-up in 3 months as scheduled.   [name]  APRN, MSN, FNP-C [facility] [address] [place], [place] [address] [phone]

## 2020-06-30 ENCOUNTER — Other Ambulatory Visit: Payer: Self-pay

## 2020-06-30 ENCOUNTER — Encounter: Payer: Self-pay | Attending: Nurse Practitioner | Admitting: Dietician

## 2020-06-30 ENCOUNTER — Encounter: Payer: Self-pay | Admitting: Dietician

## 2020-06-30 DIAGNOSIS — E119 Type 2 diabetes mellitus without complications: Secondary | ICD-10-CM | POA: Insufficient documentation

## 2020-06-30 NOTE — Progress Notes (Signed)
  Diabetes Self-Management Education  Visit Type: Follow-up  Appt. Start Time: 1430 Appt. End Time: 1500  06/30/2020  [name], identified by name and date of birth, is a 44 y.o. male with a diagnosis of Diabetes:  .   ASSESSMENT  Pt had his A1c taken a month ago. It is 6.0, the same as it was 4 months ago. Pt has been portioning his snacks, has been snacking less. Has been more aware of how much he has been snacking. Pt has been eating more consistent meals, less chicken. Eating less fast food, has noticed changes in his eating habits since our last visit. Eating more meat, trying to eat less fried foods. Pt has been moving around more, walking around the house doing chores and just to get up and move. Tries to stretch his arms and legs regularly. Pt thinks his knee is arthritic, had it looked at in the hospital but they didn't find anything wrong. Misses lunch 2-3 days a week, due to be being distracted.   Height 5\' 5"  (1.651 m), weight (!) 308 lb 9.6 oz (140 kg). Body mass index is 51.35 kg/m.   Diabetes Self-Management Education - 06/30/20 1513      Visit Information   Visit Type Follow-up      Complications   Last HgB A1C per patient/outside source --   6.0     Dietary Intake   Breakfast Canned potato soup with steak    Lunch Sandwich    Dinner Double bacon cheeseburger, fries, diet sprite    Beverage(s) diet sprite, ~4 bottles of water      Individualized Goals (developed by patient)   Nutrition Follow meal plan discussed    Reducing Risk get labs drawn      Patient Self-Evaluation of Goals - Patient rates self as meeting previously set goals (% of time)   Nutrition 25 - 50%    Physical Activity 25 - 50%    Medications >75%    Monitoring Not Applicable    Problem Solving 25 - 50%      Outcomes   Expected Outcomes Demonstrated interest in learning. Expect positive outcomes    Future DMSE 2 months    Program Status Not Completed      Subsequent Visit    Since your last visit have you continued or begun to take your medications as prescribed? Yes    Since your last visit have you had your blood pressure checked? No           Individualized Plan for Diabetes Self-Management Training:   Learning Objective:  Patient will have a greater understanding of diabetes self-management. Patient education plan is to attend individual and/or group sessions per assessed needs and concerns.   Plan:   Patient Instructions  Check out the international market. Try some fish. Look for "wild caught" on the package. Try some porgy!  Work towards eating three meals a day consistently. Don't skip lunch!  Add some vegetables to your lunch and dinner meals.  Continue to move around the house and stretch your arms and legs.  Keep up drinking 4 bottles of water a day, try to work up to 5.    Expected Outcomes:  Demonstrated interest in learning. Expect positive outcomes  If problems or questions, patient to contact team via:  Phone and Email  Future DSME appointment: 2 months

## 2020-06-30 NOTE — Patient Instructions (Addendum)
Check out the international market. Try some fish. Look for "wild caught" on the package. Try some porgy!  Work towards eating three meals a day consistently. Don't skip lunch!  Add some vegetables to your lunch and dinner meals.  Continue to move around the house and stretch your arms and legs.  Keep up drinking 4 bottles of water a day, try to work up to 5.

## 2020-08-24 ENCOUNTER — Encounter: Payer: Self-pay | Admitting: Dietician

## 2020-08-24 ENCOUNTER — Other Ambulatory Visit: Payer: Self-pay

## 2020-08-24 ENCOUNTER — Encounter: Payer: Self-pay | Attending: Nurse Practitioner | Admitting: Dietician

## 2020-08-24 DIAGNOSIS — E119 Type 2 diabetes mellitus without complications: Secondary | ICD-10-CM | POA: Insufficient documentation

## 2020-08-24 NOTE — Progress Notes (Signed)
Diabetes Self-Management Education  Visit Type: Follow-up  Appt. Start Time: 1400 Appt. End Time: 1430  08/24/2020  [name], identified by name and date of birth, is a 45 y.o. male with a diagnosis of Diabetes:  .   ASSESSMENT  Pt reports trying salmon, but it was farm raised. Pt still really liking it. Pt states the salmon was air fried in seasoning. Then tried frozen fried whiting. Pt reports either eating french fries or baked potato with the fish.  Pt reports wanting to try shrimp again, reports he had a potential allergic reaction when he was younger. Pt states he will consult his PCP about allergy testing at their next visit. Pt reports trying to be more consistent eating lunch, still misses lunch about 3 times a week.. States that when he snacks more he doesn't eat lunch. Pt reports trying to cut back on snacking so that he will want to eat. Pt reports limiting himself in the amount he snacks on when he snacks. Pt reports popping his own popcorn and adding about 1/4 stick of butter to 1/3 cup of kernels.  Pt reports improvement in his leg, but still has minor pains. Pt reports knee pain if he sits too close to his desk and presses his knee against it. Pt reports daily schedule of walking around the house, and art.  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 Pt reports getting his bachelors in art in Risk analyst in 2017, but they withheld his diploma due to unpaid Market researcher. Height 5\' 5"  (1.651 m), weight (!) 316 lb 8 oz (143.6 kg). Body mass index is 52.67 kg/m.   Diabetes Self-Management Education - 08/24/20 1438      Visit Information   Visit Type Follow-up      Dietary Intake   Breakfast Pancakes with butter and syrup for brunch, Sprite    Snack (morning) Potato chips    Lunch none    Snack (afternoon) Potato chips    Dinner Double cheeseburger with bacon, lettuce, tomato, Diet Sprite    Snack (evening) Potato chips    Beverage(s)  gallon of water, sprite      Exercise   Exercise Type ADL's      Patient Self-Evaluation of Goals - Patient rates self as meeting previously set goals (% of time)   Nutrition < 25%    Physical Activity < 25%    Medications Not Applicable    Monitoring Not Applicable    Problem Solving < 25%    Reducing Risk < 25%    Health Coping < 25%      Post-Education Assessment   Patient understands the diabetes disease and treatment process. Needs Review    Patient understands incorporating nutritional management into lifestyle. Needs Review    Patient undertands incorporating physical activity into lifestyle. Needs Review    Patient understands using medications safely. Needs Review    Patient understands monitoring blood glucose, interpreting and using results Needs Review    Patient understands prevention, detection, and treatment of acute complications. Needs Review    Patient understands prevention, detection, and treatment of chronic complications. Needs Review    Patient understands how to develop strategies to address psychosocial issues. Needs Review    Patient understands how to develop strategies to promote health/change behavior. Needs Review      Outcomes   Expected Outcomes Demonstrated limited interest in learning.  Expect minimal changes    Future DMSE 2 months  Program Status Not Completed      Subsequent Visit   Since your last visit have you continued or begun to take your medications as prescribed? No    Since your last visit have you had your blood pressure checked? No    Since your last visit have you experienced any weight changes? Gain    Weight Gain (lbs) 9    Since your last visit, are you checking your blood glucose at least once a day? N/A           Individualized Plan for Diabetes Self-Management Training:   Learning Objective:  Patient will have a greater understanding of diabetes self-management. Patient education plan is to attend individual and/or  group sessions per assessed needs and concerns.   Plan:   Patient Instructions  Try adding 2 Tbsp of butter into the pot while popping your popcorn.  Work towards eating three meals a day consistently. Don't skip lunch!   Try some more fish! Look at the international market for porgy.  Continue to drink 4-5 bottles of water.  Bring one of your drawings to our next appointment!   Expected Outcomes:  Demonstrated limited interest in learning.  Expect minimal changes  If problems or questions, patient to contact team via:  Phone and Email  Future DSME appointment: 2 months

## 2020-08-24 NOTE — Patient Instructions (Addendum)
Try adding 2 Tbsp of butter into the pot while popping your popcorn.  Work towards eating three meals a day consistently. Don't skip lunch!   Try some more fish! Look at the international market for porgy.  Continue to drink 4-5 bottles of water.  Bring one of your drawings to our next appointment!

## 2020-09-13 ENCOUNTER — Encounter: Payer: Self-pay | Admitting: Family Medicine

## 2020-09-13 ENCOUNTER — Ambulatory Visit (INDEPENDENT_AMBULATORY_CARE_PROVIDER_SITE_OTHER): Payer: Self-pay | Admitting: Family Medicine

## 2020-09-13 ENCOUNTER — Other Ambulatory Visit: Payer: Self-pay

## 2020-09-13 VITALS — BP 133/78 | HR 57 | Temp 98.4°F | Ht 65.0 in | Wt 313.0 lb

## 2020-09-13 DIAGNOSIS — E119 Type 2 diabetes mellitus without complications: Secondary | ICD-10-CM

## 2020-09-13 DIAGNOSIS — J45909 Unspecified asthma, uncomplicated: Secondary | ICD-10-CM

## 2020-09-13 DIAGNOSIS — I10 Essential (primary) hypertension: Secondary | ICD-10-CM

## 2020-09-13 DIAGNOSIS — Z91013 Allergy to seafood: Secondary | ICD-10-CM

## 2020-09-13 DIAGNOSIS — Z23 Encounter for immunization: Secondary | ICD-10-CM

## 2020-09-13 LAB — POCT URINALYSIS DIPSTICK
Bilirubin, UA: NEGATIVE
Blood, UA: NEGATIVE
Glucose, UA: NEGATIVE
Ketones, UA: NEGATIVE
Leukocytes, UA: NEGATIVE
Nitrite, UA: NEGATIVE
Protein, UA: NEGATIVE
Spec Grav, UA: 1.025 (ref 1.010–1.025)
Urobilinogen, UA: 0.2 E.U./dL
pH, UA: 5.5 (ref 5.0–8.0)

## 2020-09-13 LAB — POCT CBG (FASTING - GLUCOSE)-MANUAL ENTRY: Glucose Fasting, POC: 104 mg/dL — AB (ref 70–99)

## 2020-09-13 NOTE — Patient Instructions (Signed)
We will follow-up by phone with your laboratory results.  Your glucose was 104 today, which is consistent with your call.  A referral has been sent to the allergy/asthma clinic for follow-up on possible shellfish allergy.  For any asthma exacerbation, utilize albuterol inhaler.  Continue carbohydrate modified diet per registered dietitian   Increase exercise regimen to about 150 minutes/week.  Recommend low impact cardiovascular, like a brisk walk   We will follow-up in 3 months    Type 2 Diabetes Mellitus, Self-Care, Adult When you have type 2 diabetes (type 2 diabetes mellitus), you must make sure your blood sugar (glucose) stays in a healthy range. You can do this with:  Nutrition.  Exercise.  Lifestyle changes.  Medicines or insulin, if needed.  Support from your doctors and others. What are the risks? Having diabetes can raise your risk for other long-term (chronic) health problems. You may get medicines to help prevent these problems. How to stay aware of blood sugar  Check your blood sugar level every day, as often as told.  Have your A1C (hemoglobin A1C) level checked two or more times a year. Have it checked more often if told.  Your doctor will set personal treatment goals for you. In general, you should have these blood sugar levels: ? Before meals: 80-130 mg/dL (4.4-7.2 mmol/L). ? After meals: below 180 mg/dL (10 mmol/L). ? A1C: less than 7%.   How to manage high and low blood sugar Symptoms of high blood sugar High blood sugar is also called hyperglycemia. Know the symptoms of high blood sugar. These may include:  More thirst.  Hunger.  Feeling very tired.  Needing to pee (urinate) more often than normal.  Seeing things blurry. Symptoms of low blood sugar Low blood sugar is also called hypoglycemia. This is when blood sugar is at or below 70 mg/dL (3.9 mmol/L). Symptoms may include:  Hunger.  Feeling worried or nervous (anxious).  Feeling sweaty  and cold to the touch (clammy).  Being dizzy or light-headed.  Feeling sleepy.  A fast heartbeat.  Feeling grouchy (irritable).  Tingling or loss of feeling (numbness) around your mouth, lips, or tongue.  Restless sleep. Diabetes medicines can cause low blood sugar. You are more at risk:  While you exercise.  After exercise.  During sleep.  When you are sick.  When you skip meals or do not eat for a long time. Treating low blood sugar If you think you have low blood sugar, eat or drink something sugary right away. Keep 15 grams of a fast-acting carb (carbohydrate) with you all the time. Make sure your family and friends know how to treat you if you cannot treat yourself. Treating very low blood sugar Severe hypoglycemia is when your blood sugar is at or below 54 mg/dL (3 mmol/L). Severe hypoglycemia is an emergency. Do not wait to see if the symptoms will go away. Get medical help right away. Call your local emergency services (911 in the [place] Do not drive yourself to the hospital. You may need a glucagon shot if you have very low blood sugar and you cannot eat or drink. Have a family member or friend learn how to check your blood sugar and how to give you a glucagon shot. Ask your doctor if you should have a kit for glucagon shots. Follow these instructions at home: Medicines  Take diabetes medicines as told. If your doctor prescribed insulin or diabetes medicines, take them each day.  Do not run out of insulin  or other medicines. Plan ahead.  If you use insulin, change the amount you take based on how active you are and what foods you eat. Your doctor will tell you how to do this.  Take over-the-counter and prescription medicines only as told by your doctor. Eating and drinking  Eat healthy foods. These include: ? Low-fat (lean) proteins. ? Complex carbs, such as whole grains. ? Fresh fruits and vegetables. ? Low-fat dairy products. ? Healthy fats.  Meet with a  food expert (dietitian) to make an eating plan.  Follow instructions from your doctor about what you cannot eat or drink.  Drink enough fluid to keep your pee (urine) pale yellow.  Keep track of carbs that you eat. Read food labels and learn serving sizes of foods.  Follow your sick-day plan when you cannot eat or drink as normal. Make this plan with your doctor so it is ready to use.   Activity  Exercise as told by your doctor. You may need to: ? Do stretching and strength exercises 2 or more times a week. ? Do 150 minutes or more of exercise each week that makes your heart beat faster and makes you sweat.  Spread out your exercise over 3 or more days a week.  Do not go more than 2 days in a row without exercise.  Talk with your doctor before you start a new exercise. Your doctor may tell you to change: ? How much insulin or medicines you take. ? How much food you eat. Lifestyle  Do not use any products that contain nicotine or tobacco, such as cigarettes, e-cigarettes, and chewing tobacco. If you need help quitting, ask your doctor.  If you drink alcohol and your doctor says alcohol is safe for you: ? Limit how much you use to:  0-1 drink a day for women who are not pregnant.  0-2 drinks a day for men. ? Be aware of how much alcohol is in your drink. In the [place] one drink equals one 12 oz bottle of beer (355 mL), one 5 oz glass of wine (148 mL), or one 1 oz glass of hard liquor (44 mL).  Learn to deal with stress. If you need help, ask your doctor. Body care  Stay up to date with your shots (immunizations).  Have your eyes and feet checked by a doctor as often as told.  Check your skin and feet every day. Check for cuts, bruises, redness, blisters, or sores.  Brush your teeth and gums two times a day. Floss one or more times a day.  Go to the dentist one or more times every 6 months.  Stay at a healthy weight.   General instructions  Share your diabetes care plan  with: ? Your work or school. ? People you live with.  Carry a card or wear jewelry that says you have diabetes.  Keep all follow-up visits as told by your doctor. This is important. Questions to ask your doctor  Do I need to meet with a certified expert in diabetes education and care?  Where can I find a support group? Where to find more information  American Diabetes Association: www.diabetes.org  American Association of Diabetes Care and Education Specialists: www.diabeteseducator.org  International Diabetes Federation: MemberVerification.ca Summary  When you have type 2 diabetes, you must make sure your blood sugar (glucose) stays in a healthy range. You can do this with nutrition, exercise, medicines and insulin, and support from doctors and others.  Check your blood  sugar every day, or as often as told.  Having diabetes can raise your risk for other long-term health problems. You may get medicines to help prevent these problems.  Share your diabetes management plan with people at work, school, and home.  Keep all follow-up visits as told by your doctor. This is important. This information is not intended to replace advice given to you by your health care provider. Make sure you discuss any questions you have with your health care provider. Document Revised: 02/17/2020 Document Reviewed: 02/17/2020 Elsevier Patient Education  [place].

## 2020-09-13 NOTE — Progress Notes (Signed)
Patient [facility]  Established Patient Office Visit  Subjective:  Patient ID: Edward Carr, male    DOB: 1975-08-02  Age: 45 y.o. MRN: [id number]  CC:  Chief Complaint  Patient presents with  . Diabetes    3 month follow , needs referral to be tested for reaction to seafood     HPI  Edward Carr is a 45 year old male with a medical history significant for essential hypertension, type 2 diabetes mellitus, morbid obesity, and mild intermittent asthma presents for a follow up of chronic conditions. Patient has been taking all prescribed medications without interruption. He establish care with registered dietitian 1 month ago and has been following a low calorie, carbohydrate modified diet over the past several weeks. Patient states that he is doing fairly well with diet. He is not exercising and is mostly sedentary. Patient has a history of shellfish allergy and mild intermittent asthma. He has never been evaluated by an allergist. Patient is requesting a referral for allergy testing. He states that he has not had a reaction to shellfish in a very long time. His response after eating shellfish was eye swelling. Patient has no other known allergies.    Diabetes He has type 2 diabetes mellitus. Pertinent negatives for hypoglycemia include no sweats. Pertinent negatives for diabetes include no blurred vision, no chest pain, no fatigue, no foot paresthesias, no polydipsia, no polyphagia, no polyuria, no weakness and no weight loss. There are no known risk factors for coronary artery disease. When asked about current treatments, none were reported. He is following a diabetic diet. Meal planning includes calorie counting, avoidance of concentrated sweets and carbohydrate counting. He has had a previous visit with a dietitian. He rarely participates in exercise. There is no change in his home blood glucose trend. He does not see a podiatrist.Eye exam is not  current.  Hypertension This is a chronic problem. The problem is controlled. Pertinent negatives include no anxiety, blurred vision, chest pain, orthopnea, palpitations, peripheral edema, shortness of breath or sweats. Risk factors for coronary artery disease include obesity.    Past Medical History:  Diagnosis Date  . Asthma   . Hypertension     Past Surgical History:  Procedure Laterality Date  . DENTAL SURGERY      Family History  Problem Relation Age of Onset  . Diabetes Mother   . Cancer Father   . Cancer Sister     Social History   Socioeconomic History  . Marital status: Single    Spouse name: Not on file  . Number of children: Not on file  . Years of education: Not on file  . Highest education level: Not on file  Occupational History  . Not on file  Tobacco Use  . Smoking status: Never Smoker  . Smokeless tobacco: Never Used  Vaping Use  . Vaping Use: Never used  Substance and Sexual Activity  . Alcohol use: No  . Drug use: No  . Sexual activity: Never  Other Topics Concern  . Not on file  Social History Narrative  . Not on file   Social Determinants of Health   Financial Resource Strain: Not on file  Food Insecurity: Not on file  Transportation Needs: Not on file  Physical Activity: Not on file  Stress: Not on file  Social Connections: Not on file  Intimate Partner Violence: Not on file    Outpatient Medications Prior to Visit  Medication Sig Dispense  Refill  . amLODipine (NORVASC) 10 MG tablet Take 1 tablet (10 mg total) by mouth daily. 90 tablet 1  . cloNIDine (CATAPRES) 0.1 MG tablet Take 0.5 tablets (0.05 mg total) by mouth at bedtime. X 2 weeks. Then 1/2 pill by mouth every other night x 2 weeks then d/c 30 tablet 0  . lisinopril-hydrochlorothiazide (ZESTORETIC) 20-25 MG tablet Take 1 tablet by mouth daily. 90 tablet 1  . metFORMIN (GLUCOPHAGE) 500 MG tablet Take 1 tablet (500 mg total) by mouth 2 (two) times daily with a meal. 180 tablet 1   . albuterol (VENTOLIN HFA) 108 (90 Base) MCG/ACT inhaler Inhale 1 puff into the lungs every 6 (six) hours as needed for wheezing or shortness of breath. (Patient not taking: Reported on 09/13/2020) 8 g 1  . cyclobenzaprine (FLEXERIL) 10 MG tablet Take 1 tablet (10 mg total) by mouth at bedtime. (Patient not taking: No sig reported) 5 tablet 0   No facility-administered medications prior to visit.    No Known Allergies  ROS Review of Systems  Constitutional: Negative for fatigue and weight loss.  HENT: Negative.   Eyes: Negative for blurred vision.  Respiratory: Negative for shortness of breath.   Cardiovascular: Negative for chest pain, palpitations and orthopnea.  Gastrointestinal: Negative.   Endocrine: Negative for polydipsia, polyphagia and polyuria.  Genitourinary: Negative.   Musculoskeletal: Negative.   Neurological: Negative.  Negative for weakness.  Psychiatric/Behavioral: Negative.       Objective:    Physical Exam Constitutional:      Appearance: Normal appearance. He is obese.  HENT:     Nose: Nose normal.  Eyes:     Pupils: Pupils are equal, round, and reactive to light.  Cardiovascular:     Rate and Rhythm: Normal rate and regular rhythm.     Pulses: Normal pulses.  Abdominal:     General: Abdomen is flat. Bowel sounds are normal.  Musculoskeletal:        General: Normal range of motion.  Skin:    General: Skin is warm.  Neurological:     General: No focal deficit present.     Mental Status: He is alert. Mental status is at baseline.  Psychiatric:        Mood and Affect: Mood normal.        Behavior: Behavior normal.        Thought Content: Thought content normal.        Judgment: Judgment normal.     BP (!) 156/106 (BP Location: Left Arm, Patient Position: Sitting, Cuff Size: Large)   Pulse (!) 57   Temp 98.4 F (36.9 C) (Temporal)   Ht 5\' 5"  (1.651 m)   Wt (!) 313 lb (142 kg)   SpO2 95%   BMI 52.09 kg/m  Wt Readings from Last 3  Encounters:  09/13/20 (!) 313 lb (142 kg)  08/24/20 (!) 316 lb 8 oz (143.6 kg)  06/30/20 (!) 308 lb 9.6 oz (140 kg)     Health Maintenance Due  Topic Date Due  . PNEUMOCOCCAL POLYSACCHARIDE VACCINE AGE 17-64 HIGH RISK  Never done  . COVID-19 Vaccine (3 - Booster) 05/15/2020   Immunization History  Administered Date(s) Administered  . Influenza,inj,Quad PF,6+ Mos 08/22/2016, 05/22/2017, 08/22/2018, 04/15/2019  . PFIZER(Purple Top)SARS-COV-2 Vaccination 10/26/2019, 11/04/2019, 11/14/2019  . Pneumococcal Conjugate-13 06/07/2020  . Tdap 08/22/2016   There are no preventive care reminders to display for this patient.  Lab Results  Component Value Date   TSH 1.430 12/11/2019  Lab Results  Component Value Date   WBC 10.3 12/11/2019   HGB 13.7 12/11/2019   HCT 40.7 12/11/2019   MCV 82 12/11/2019   PLT 395 12/11/2019   Lab Results  Component Value Date   NA 138 06/07/2020   K 4.0 06/07/2020   CO2 25 06/07/2020   GLUCOSE 91 06/07/2020   BUN 14 06/07/2020   CREATININE 1.15 06/07/2020   BILITOT 0.3 06/07/2020   ALKPHOS 87 06/07/2020   AST 16 06/07/2020   ALT 20 06/07/2020   PROT 8.0 06/07/2020   ALBUMIN 4.6 06/07/2020   CALCIUM 10.2 06/07/2020   Lab Results  Component Value Date   CHOL 149 04/15/2019   Lab Results  Component Value Date   HDL 31 (L) 04/15/2019   Lab Results  Component Value Date   LDLCALC 92 04/15/2019   Lab Results  Component Value Date   TRIG 146 04/15/2019   Lab Results  Component Value Date   CHOLHDL 4.8 04/15/2019   Lab Results  Component Value Date   HGBA1C 6.0 (A) 06/07/2020   HGBA1C 6.0 06/07/2020   HGBA1C 6.0 06/07/2020   HGBA1C 6.0 06/07/2020      Assessment & Plan:   Problem List Items Addressed This Visit      Cardiovascular and Mediastinum   Essential hypertension   Relevant Orders   Comprehensive metabolic panel     Respiratory   Asthma   Relevant Orders   Ambulatory referral to Allergy    Other Visit  Diagnoses    Type 2 diabetes mellitus without complication, without long-term current use of insulin ([facility])    -  Primary   Relevant Orders   POCT Urinalysis Dipstick (Completed)   Glucose (CBG), Fasting (Completed)   Comprehensive metabolic panel   Hemoglobin A1c   Allergy to shellfish       Relevant Orders   Ambulatory referral to Allergy     1. Type 2 diabetes mellitus without complication, without long-term current use of insulin ([facility]) - POCT Urinalysis Dipstick - Glucose (CBG), Fasting - Comprehensive metabolic panel - Hemoglobin A1c  2. Mild asthma without complication, unspecified whether persistent - Ambulatory referral to Allergy  3. Allergy to shellfish - Ambulatory referral to Allergy  4. Essential hypertension - Continue medication, monitor blood pressure at home. Continue DASH diet. Reminder to go to the ER if any CP, SOB, nausea, dizziness, severe HA, changes vision/speech, left arm numbness and tingling and jaw pain.    - Comprehensive metabolic panel  5. Immunization due - Pneumococcal polysaccharide vaccine 23-valent greater than or equal to 2yo subcutaneous/IM  Follow-up: Return in about 3 months (around 12/11/2020) for diabetes, hypertension.    [name]  APRN, MSN, FNP-C [facility] [address] [place], [place] [address] [phone]

## 2020-09-14 ENCOUNTER — Telehealth: Payer: Self-pay | Admitting: Family Medicine

## 2020-09-14 LAB — HEMOGLOBIN A1C
Est. average glucose Bld gHb Est-mCnc: 131 mg/dL
Hgb A1c MFr Bld: 6.2 % — ABNORMAL HIGH (ref 4.8–5.6)

## 2020-09-14 LAB — COMPREHENSIVE METABOLIC PANEL
ALT: 19 IU/L (ref 0–44)
AST: 17 IU/L (ref 0–40)
Albumin/Globulin Ratio: 1.4 (ref 1.2–2.2)
Albumin: 4.6 g/dL (ref 4.0–5.0)
Alkaline Phosphatase: 80 IU/L (ref 44–121)
BUN/Creatinine Ratio: 11 (ref 9–20)
BUN: 14 mg/dL (ref 6–24)
Bilirubin Total: 0.5 mg/dL (ref 0.0–1.2)
CO2: 22 mmol/L (ref 20–29)
Calcium: 10.1 mg/dL (ref 8.7–10.2)
Chloride: 99 mmol/L (ref 96–106)
Creatinine, Ser: 1.23 mg/dL (ref 0.76–1.27)
GFR calc Af Amer: 83 mL/min/{1.73_m2} (ref >59)
GFR calc non Af Amer: 72 mL/min/{1.73_m2} (ref >59)
Globulin, Total: 3.2 g/dL (ref 1.5–4.5)
Glucose: 99 mg/dL (ref 65–99)
Potassium: 4.2 mmol/L (ref 3.5–5.2)
Sodium: 138 mmol/L (ref 134–144)
Total Protein: 7.8 g/dL (ref 6.0–8.5)

## 2020-09-14 NOTE — Telephone Encounter (Signed)
Edward Carr is a 45 year old male with a medical history significant for type 2 DM and hypertension.  Hemoglobin a1C has increased to 6.2. Recommend that patient continue metformin 500 mg twice daily and continue to follow up with dietician as scheduled.  Increase daily physical activity. Recommend 150 minutes of low impact cardiovascular exercise per day.  BMI is around 52. The patient is asked to make an attempt to improve diet and exercise patterns to aid in medical management of this problem.   Follow up in clinic as scheduled.   [name]  APRN, MSN, FNP-C [facility] [address] [place], [place] [address] [phone]

## 2020-10-03 ENCOUNTER — Other Ambulatory Visit: Payer: Self-pay | Admitting: Family Medicine

## 2020-10-03 DIAGNOSIS — E119 Type 2 diabetes mellitus without complications: Secondary | ICD-10-CM

## 2020-10-17 ENCOUNTER — Encounter: Payer: Self-pay | Admitting: Dietician

## 2020-10-17 ENCOUNTER — Encounter: Payer: Self-pay | Attending: Nurse Practitioner | Admitting: Dietician

## 2020-10-17 ENCOUNTER — Other Ambulatory Visit: Payer: Self-pay

## 2020-10-17 DIAGNOSIS — E119 Type 2 diabetes mellitus without complications: Secondary | ICD-10-CM | POA: Insufficient documentation

## 2020-10-17 NOTE — Progress Notes (Signed)
Diabetes Self-Management Education  Visit Type: Follow-up  Appt. Start Time: 1400 Appt. End Time: 1430  10/17/2020  [name], identified by name and date of birth, is a 45 y.o. male with a diagnosis of Diabetes:  .   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 Pt reports eating lunch 5 days a week now. May have a sandwich. Pt still drinking 4-5 bottles of water. Pt reports cutting down on snacks. Pt reports using less butter in their popcorn, adding canola oil in place of the butter they removed. Pt still eating more whiting, seasons with chicken seasoning. Pt reports their leg is still doing better, but flares up every now and then. Pt reports walking more and stretching their leg out more. Pt reports reading more nutrtion labels.  A1c is up to 6.2 from 6.0 4 months ago  Height _0  (1.651 m), weight (!) 316 lb 11.2 oz (143.7 kg). Body mass index is 52.7 kg/m.   Diabetes Self-Management Education - 10/17/20 1400      Visit Information   Visit Type Follow-up      Dietary Intake   Breakfast Bagel, with butter    Lunch 2 granola bars    Dinner Cooked corn beef, broccoli, frozen mac and cheese    Beverage(s) water, diet rite soda      Exercise   Exercise Type ADL's      Patient Self-Evaluation of Goals - Patient rates self as meeting previously set goals (% of time)   Nutrition < 25%    Physical Activity < 25%    Medications Not Applicable    Monitoring Not Applicable    Problem Solving 25 - 50%    Reducing Risk < 25%    Health Coping 25 - 50%      Post-Education Assessment   Patient understands the diabetes disease and treatment process. Needs Review    Patient understands incorporating nutritional management into lifestyle. Needs Review    Patient undertands incorporating physical activity into lifestyle. Needs Review    Patient understands using medications safely. Needs Review    Patient understands  monitoring blood glucose, interpreting and using results Needs Review    Patient understands prevention, detection, and treatment of acute complications. Needs Review    Patient understands prevention, detection, and treatment of chronic complications. Needs Review    Patient understands how to develop strategies to address psychosocial issues. Needs Review    Patient understands how to develop strategies to promote health/change behavior. Needs Review      Outcomes   Expected Outcomes Demonstrated interest in learning. Expect positive outcomes    Future DMSE 3-4 months    Program Status Not Completed      Subsequent Visit   Since your last visit have you continued or begun to take your medications as prescribed? No    Since your last visit have you had your blood pressure checked? No    Since your last visit have you experienced any weight changes? Gain    Weight Gain (lbs) 3    Since your last visit, are you checking your blood glucose at least once a day? N/A           Individualized Plan for Diabetes Self-Management Training:   Learning Objective:  Patient will have a greater understanding of diabetes self-management. Patient education plan is to attend individual and/or group sessions per assessed needs and concerns.   Plan:  Patient Instructions  Continue to eating three meals a day, about 5-6 hours apart!  Begin to recognize carbohydrates in your food choices!  Have 4 carb choices at each meal (60 g).   Begin to build your meals using the proportions of the Balanced Plate. . First, select your carb choice(s) for the meal, and determine how much you should have to equal 4 carb choices (60 g). . Next, select your source of protein to pair with your carb choice(s). . Finally, complete the remaining half of your meal with a variety of non-starchy vegetables.  Continue to pursue your art! You are very talented.  Consider going for short walks outside your house as the  weather gets better!    Expected Outcomes:  Demonstrated interest in learning. Expect positive outcomes  Education material provided: Food label handouts, Meal plan card and My Plate  If problems or questions, patient to contact team via:  Phone and Email  Future DSME appointment: 3-4 months

## 2020-10-17 NOTE — Patient Instructions (Signed)
Continue to eating three meals a day, about 5-6 hours apart!  Begin to recognize carbohydrates in your food choices!  Have 4 carb choices at each meal (60 g).   Begin to build your meals using the proportions of the Balanced Plate. . First, select your carb choice(s) for the meal, and determine how much you should have to equal 4 carb choices (60 g). . Next, select your source of protein to pair with your carb choice(s). . Finally, complete the remaining half of your meal with a variety of non-starchy vegetables.  Continue to pursue your art! You are very talented.  Consider going for short walks outside your house as the weather gets better!

## 2020-11-14 ENCOUNTER — Other Ambulatory Visit: Payer: Self-pay

## 2020-11-14 ENCOUNTER — Encounter: Payer: Self-pay | Admitting: Allergy

## 2020-11-14 ENCOUNTER — Ambulatory Visit (INDEPENDENT_AMBULATORY_CARE_PROVIDER_SITE_OTHER): Payer: No Typology Code available for payment source | Admitting: Allergy

## 2020-11-14 VITALS — BP 180/146 | HR 97 | Temp 97.3°F | Resp 16 | Ht 65.0 in | Wt 322.2 lb

## 2020-11-14 DIAGNOSIS — H1013 Acute atopic conjunctivitis, bilateral: Secondary | ICD-10-CM

## 2020-11-14 DIAGNOSIS — R21 Rash and other nonspecific skin eruption: Secondary | ICD-10-CM

## 2020-11-14 DIAGNOSIS — J3089 Other allergic rhinitis: Secondary | ICD-10-CM

## 2020-11-14 DIAGNOSIS — J4520 Mild intermittent asthma, uncomplicated: Secondary | ICD-10-CM

## 2020-11-14 DIAGNOSIS — I10 Essential (primary) hypertension: Secondary | ICD-10-CM

## 2020-11-14 DIAGNOSIS — T781XXD Other adverse food reactions, not elsewhere classified, subsequent encounter: Secondary | ICD-10-CM

## 2020-11-14 MED ORDER — EPINEPHRINE 0.3 MG/0.3ML IJ SOAJ: 0.3000 mg | INTRAMUSCULAR | 2 refills | Status: DC | PRN

## 2020-11-14 MED ORDER — ZYRTEC ALLERGY 10 MG PO CAPS: 10.0000 mg | ORAL_CAPSULE | Freq: Every day | ORAL | 5 refills | Status: DC

## 2020-11-14 NOTE — Assessment & Plan Note (Signed)
Elevated blood pressure reading in the office. Patient already on antihypertensive medications.  Follow up with PCP regarding this.

## 2020-11-14 NOTE — Assessment & Plan Note (Signed)
Asthma like symptoms for 30+ years. Main triggers are exertion and panic attacks. Uses albuterol once a week with good benefit.  Today's spirometry showed restriction most likely due to body habitus.  May use albuterol rescue inhaler 2 puffs every 4 to 6 hours as needed for shortness of breath, chest tightness, coughing, and wheezing. May use albuterol rescue inhaler 2 puffs 5 to 15 minutes prior to strenuous physical activities. Monitor frequency of use.

## 2020-11-14 NOTE — Patient Instructions (Addendum)
Today's skin testing showed: Positive to grass, trees, ragweed, weed pollen, dust mites, cockroach. Borderline to cat and dog.  Positive to shellfish, shrimp, crab, lobster, oyster.   Environmental allergies  Start environmental control measures as below.  May use over the counter antihistamines such as Zyrtec (cetirizine) 10mg  daily as needed.  Food allergy:  Continue strict avoidance of shellfish and mollusks  I have prescribed epinephrine injectable and demonstrated proper use. For mild symptoms you can take over the counter antihistamines such as Benadryl and monitor symptoms closely. If symptoms worsen or if you have severe symptoms including breathing issues, throat closure, significant swelling, whole body hives, severe diarrhea and vomiting, lightheadedness then inject epinephrine and seek immediate medical care afterwards.  Food action plan given.   Asthma:   Today's breathing test showed some restriction due to body habitus.  May use albuterol rescue inhaler 2 puffs every 4 to 6 hours as needed for shortness of breath, chest tightness, coughing, and wheezing. May use albuterol rescue inhaler 2 puffs 5 to 15 minutes prior to strenuous physical activities. Monitor frequency of use.   Skin:  Follow up with dermatology  See below for proper skin care.   Follow up in 4 months or sooner if needed.   Follow up with PCP regarding your blood pressure.   Reducing Pollen Exposure . Pollen seasons: trees (spring), grass (summer) and ragweed/weeds (fall). Marland Kitchen Keep windows closed in your home and car to lower pollen exposure.  Susa Simmonds air conditioning in the bedroom and throughout the house if possible.  . Avoid going out in dry windy days - especially early morning. . Pollen counts are highest between 5 - 10 AM and on dry, hot and windy days.  . Save outside activities for late afternoon or after a heavy rain, when pollen levels are lower.  . Avoid mowing of grass if you have  grass pollen allergy. Marland Kitchen Be aware that pollen can also be transported indoors on people and pets.  . Dry your clothes in an automatic dryer rather than hanging them outside where they might collect pollen.  . Rinse hair and eyes before bedtime. Control of House Dust Mite Allergen . Dust mite allergens are a common trigger of allergy and asthma symptoms. While they can be found throughout the house, these microscopic creatures thrive in warm, humid environments such as bedding, upholstered furniture and carpeting. . Because so much time is spent in the bedroom, it is essential to reduce mite levels there.  . Encase pillows, mattresses, and box springs in special allergen-proof fabric covers or airtight, zippered plastic covers.  . Bedding should be washed weekly in hot water (130 F) and dried in a hot dryer. Allergen-proof covers are available for comforters and pillows that can't be regularly washed.  Wendee Copp the allergy-proof covers every few months. Minimize clutter in the bedroom. Keep pets out of the bedroom.  Marland Kitchen Keep humidity less than 50% by using a dehumidifier or air conditioning. You can buy a humidity measuring device called a hygrometer to monitor this.  . If possible, replace carpets with hardwood, linoleum, or washable area rugs. If that's not possible, vacuum frequently with a vacuum that has a HEPA filter. . Remove all upholstered furniture and non-washable window drapes from the bedroom. . Remove all non-washable stuffed toys from the bedroom.  Wash stuffed toys weekly. Cockroach Allergen Avoidance Cockroaches are often found in the homes of densely populated urban areas, schools or commercial buildings, but these creatures can  lurk almost anywhere. This does not mean that you have a dirty house or living area. . Block all areas where roaches can enter the home. This includes crevices, wall cracks and windows.  . Cockroaches need water to survive, so fix and seal all leaky faucets and  pipes. Have an exterminator go through the house when your family and pets are gone to eliminate any remaining roaches. Marland Kitchen Keep food in lidded containers and put pet food dishes away after your pets are done eating. Vacuum and sweep the floor after meals, and take out garbage and recyclables. Use lidded garbage containers in the kitchen. Wash dishes immediately after use and clean under stoves, refrigerators or toasters where crumbs can accumulate. Wipe off the stove and other kitchen surfaces and cupboards regularly. Pet Allergen Avoidance: . Contrary to popular opinion, there are no "hypoallergenic" breeds of dogs or cats. That is because people are not allergic to an animal's hair, but to an allergen found in the animal's saliva, dander (dead skin flakes) or urine. Pet allergy symptoms typically occur within minutes. For some people, symptoms can build up and become most severe 8 to 12 hours after contact with the animal. People with severe allergies can experience reactions in public places if dander has been transported on the pet owners' clothing. Marland Kitchen Keeping an animal outdoors is only a partial solution, since homes with pets in the yard still have higher concentrations of animal allergens. . Before getting a pet, ask your allergist to determine if you are allergic to animals. If your pet is already considered part of your family, try to minimize contact and keep the pet out of the bedroom and other rooms where you spend a great deal of time. . As with dust mites, vacuum carpets often or replace carpet with a hardwood floor, tile or linoleum. . High-efficiency particulate air (HEPA) cleaners can reduce allergen levels over time. . While dander and saliva are the source of cat and dog allergens, urine is the source of allergens from rabbits, hamsters, mice and [place] pigs; so ask a non-allergic family member to clean the animal's cage. . If you have a pet allergy, talk to your allergist about the  potential for allergy immunotherapy (allergy shots). This strategy can often provide long-term relief.  Skin care recommendations  Bath time: . Always use lukewarm water. AVOID very hot or cold water. Marland Kitchen Keep bathing time to 5-10 minutes. . Do NOT use bubble bath. . Use a mild soap and use just enough to wash the dirty areas. . Do NOT scrub skin vigorously.  . After bathing, pat dry your skin with a towel. Do NOT rub or scrub the skin.  Moisturizers and prescriptions:  . ALWAYS apply moisturizers immediately after bathing (within 3 minutes). This helps to lock-in moisture. . Use the moisturizer several times a day over the whole body. Kermit Balo summer moisturizers include: Aveeno, CeraVe, Cetaphil. Kermit Balo winter moisturizers include: Aquaphor, Vaseline, Cerave, Cetaphil, Eucerin, Vanicream. . When using moisturizers along with medications, the moisturizer should be applied about one hour after applying the medication to prevent diluting effect of the medication or moisturize around where you applied the medications. When not using medications, the moisturizer can be continued twice daily as maintenance.  Laundry and clothing: . Avoid laundry products with added color or perfumes. . Use unscented hypo-allergenic laundry products such as Tide free, Cheer free & gentle, and All free and clear.  . If the skin still seems dry or sensitive,  you can try double-rinsing the clothes. . Avoid tight or scratchy clothing such as wool. . Do not use fabric softeners or dyer sheets.

## 2020-11-14 NOTE — Assessment & Plan Note (Signed)
Persistent facial rash even prior to the COVID-19 pandemic and mask wearing. No triggers noted. No prior dermatology evaluation.  Not sure what's causing the rash.   Follow up with dermatology  See below for proper skin care.

## 2020-11-14 NOTE — Assessment & Plan Note (Signed)
Delayed reaction to shrimp as a child in the form of right eye swelling. Resolved without any intervention. Then had another episode of eye swelling as an adult but not sure of shellfish exposure. Tolerates finned fish with no issues. No prior work up.   Today's skin testing showed: Positive to shellfish, shrimp, crab, lobster, oyster.   Continue strict avoidance of shellfish and mollusks  I have prescribed epinephrine injectable and demonstrated proper use. For mild symptoms you can take over the counter antihistamines such as Benadryl and monitor symptoms closely. If symptoms worsen or if you have severe symptoms including breathing issues, throat closure, significant swelling, whole body hives, severe diarrhea and vomiting, lightheadedness then inject epinephrine and seek immediate medical care afterwards.  Food action plan given.

## 2020-11-14 NOTE — Progress Notes (Signed)
New Patient Note  RE: Edward Carr MRN: [id number] DOB: 04/02/1976 Date of Office Visit: 11/14/2020  Consult requested by: Dorena Dew, FNP Primary care provider: Dorena Dew, FNP  Chief Complaint: Asthma (See's PCP. Says asthma is controlled) and Allergy Testing (Wants to confirm a shellfish allergy. Had a reaction as a child. Avoided shellfish since the reaction as a child where his right eye was swollen shut the next day after shellfish consumption.)  History of Present Illness: I had the pleasure of seeing Edward Carr for initial evaluation at the [facility] on 11/14/2020. He is a 45 y.o. male, who is referred here by Dorena Dew, FNP for the evaluation of asthma and food allergy.  Food: He reports food allergy to shellfish. The reaction occurred at around age 52, after he ate a few pieces of shrimp for dinner. The following day at school he had swelling on the right eye only. Denies any hives, wheezing, abdominal pain, diarrhea, vomiting.  He had shrimp prior to this reaction with no issues but has been avoiding it since then. Symptoms resolved within 30 minutes without any medications.   He also had another episode of eye swelling when he was working at [employer]. He was walking past a food market and had eye swelling. He thinks it was some type of spice that may have caused this and he is pretty sure there was no shellfish around. Symptoms resolved within 10 minutes without any medications.   Denies any associated cofactors such as exertion, infection, NSAID use.  Past work up includes: none. Dietary History: patient has been eating other foods including milk, eggs, peanut, treenuts, sesame, fish, soy, wheat, meats, fruits and vegetables.  He reports reading labels and avoiding shellfish in diet completely.   Asthma: He reports symptoms of chest tightness, shortness of breath, coughing, wheezing for 30+ years. Current medications include albuterol  prn which help. He tried the following inhalers: unsure. Main triggers are exertion and panic attacks. In the last month, frequency of symptoms: 1x/week. Frequency of nocturnal symptoms: 0x/month. Frequency of SABA use: 1x/week. Interference with physical activity: yes. In the last 12 months, emergency room visits/urgent care visits/doctor office visits or hospitalizations due to respiratory issues: 0. In the last 12 months, oral steroids courses: 0. Lifetime history of hospitalization for respiratory issues: no. Prior intubations: no. History of pneumonia: yes. He was not evaluated by allergist/pulmonologist in the past. Smoking exposure: no. Up to date with flu vaccine: yes. Up to date with COVID-19 vaccine: yes. Prior Covid-19 infection: no. History of reflux: yes.  Rash on the face since before Covid-19 pandemic. No triggers noted. No dermatology evaluation.   Assessment and Plan: Edward Carr is a 45 y.o. male with: Adverse reaction to food, subsequent encounter Delayed reaction to shrimp as a child in the form of right eye swelling. Resolved without any intervention. Then had another episode of eye swelling as an adult but not sure of shellfish exposure. Tolerates finned fish with no issues. No prior work up.   Today's skin testing showed: Positive to shellfish, shrimp, crab, lobster, oyster.   Continue strict avoidance of shellfish and mollusks  I have prescribed epinephrine injectable and demonstrated proper use. For mild symptoms you can take over the counter antihistamines such as Benadryl and monitor symptoms closely. If symptoms worsen or if you have severe symptoms including breathing issues, throat closure, significant swelling, whole body hives, severe diarrhea and vomiting, lightheadedness then inject epinephrine and seek  immediate medical care afterwards.  Food action plan given.   Other allergic rhinitis Some rhino conjunctivitis symptoms in the spring. No prior work up and does not  take medications for this.  Today's skin testing showed: Positive to grass, trees, ragweed, weed pollen, dust mites, cockroach. Borderline to cat and dog.   Start environmental control measures as below.  May use over the counter antihistamines such as Zyrtec (cetirizine) 10mg  daily as needed.  Question if the periorbital swelling in the HPI was due to environmental allergy exposure rather than food allergies given timeline of events.  Allergic conjunctivitis of both eyes  See assessment and plan as above for allergic rhinitis.  Asthma Asthma like symptoms for 30+ years. Main triggers are exertion and panic attacks. Uses albuterol once a week with good benefit.  Today's spirometry showed restriction most likely due to body habitus.  May use albuterol rescue inhaler 2 puffs every 4 to 6 hours as needed for shortness of breath, chest tightness, coughing, and wheezing. May use albuterol rescue inhaler 2 puffs 5 to 15 minutes prior to strenuous physical activities. Monitor frequency of use.   Rash and other nonspecific skin eruption Persistent facial rash even prior to the COVID-19 pandemic and mask wearing. No triggers noted. No prior dermatology evaluation.  Not sure what's causing the rash.   Follow up with dermatology  See below for proper skin care.   Essential hypertension Elevated blood pressure reading in the office. Patient already on antihypertensive medications.  Follow up with PCP regarding this.   Return in about 4 months (around 03/16/2021).  Meds ordered this encounter  Medications  . EPINEPHrine 0.3 mg/0.3 mL IJ SOAJ injection    Sig: Inject 0.3 mg into the muscle as needed for anaphylaxis.    Dispense:  1 each    Refill:  2  . Cetirizine HCl (ZYRTEC ALLERGY) 10 MG CAPS    Sig: Take 1 capsule (10 mg total) by mouth daily.    Dispense:  30 capsule    Refill:  5   Lab Orders  No laboratory test(s) ordered today    Other allergy screening: Rhino  conjunctivitis: yes  Itchy eyes during the spring and takes no medications for this.  Medication allergy: no Hymenoptera allergy: no Urticaria: no Eczema:no History of recurrent infections suggestive of immunodeficency: no  Diagnostics: Spirometry:  Tracings reviewed. His effort: Good reproducible efforts. FVC: 2.14L FEV1: 1.54L, 52% predicted FEV1/FVC ratio: 72% Interpretation: Spirometry consistent with possible restrictive disease.  Please see scanned spirometry results for details.  Skin Testing: Environmental allergy panel and select foods. Positive to grass, trees, ragweed, weed pollen, dust mites, cockroach. Borderline to cat and dog.  Positive to shellfish, shrimp, crab, lobster, oyster.  Results discussed with patient/family.  Airborne Adult Perc - 11/14/20 1428    Time Antigen Placed 1429    Allergen Manufacturer Lavella Hammock    Location Back    Number of Test 59    1. Control-Buffer 50% Glycerol Negative    2. Control-Histamine 1 mg/ml 2+    3. Albumin saline Negative    4. Eagle 2+    5. [place] 2+    6. Johnson Negative    7. Kentucky Blue 2+    8. Meadow Fescue Negative    9. Perennial Rye 3+    10. Sweet Vernal Negative    11. Timothy 2+    12. Cocklebur Negative    13. Burweed Marshelder Negative    14. Ragweed, short Negative  15. Ragweed, Giant Negative    16. Plantain,  English Negative    17. Lamb's Quarters Negative    18. Sheep Sorrell Negative    19. Rough Pigweed Negative    20. Marsh Elder, Rough Negative    21. Mugwort, Common Negative    22. Ash mix 4+    23. Birch mix 4+    24. Beech American 3+    25. Box, Elder 2+    26. Cedar, red Negative    27. Cottonwood, [place] Negative    28. Elm mix Negative    29. Hickory 4+    30. Maple mix 3+    31. Oak, [place] mix 4+    32. Pecan Pollen 4+    33. Pine mix 2+    34. Sycamore Eastern Negative    35. Kelford, Black Pollen 4+    36. Alternaria alternata Negative    37. Cladosporium  Herbarum Negative    38. Aspergillus mix Negative    39. Penicillium mix Negative    40. Bipolaris sorokiniana (Helminthosporium) Negative    41. Drechslera spicifera (Curvularia) Negative    42. Mucor plumbeus Negative    43. Fusarium moniliforme Negative    44. Aureobasidium pullulans (pullulara) Negative    45. Rhizopus oryzae Negative    46. Botrytis cinera Negative    47. Epicoccum nigrum Negative    48. Phoma betae Negative    49. Candida Albicans Negative    50. Trichophyton mentagrophytes Negative    51. Mite, D Farinae  5,000 AU/ml 2+    52. Mite, D Pteronyssinus  5,000 AU/ml 3+    53. Cat Hair 10,000 BAU/ml Negative    54.  Dog Epithelia Negative    55. Mixed Feathers Negative    56. Horse Epithelia Negative    57. Cockroach, German 4+    58. Mouse Negative    59. Tobacco Leaf Negative          Intradermal - 11/14/20 1517    Time Antigen Placed 1517    Location Arm    Number of Test 10    Control Negative    Johnson 3+    Ragweed mix 2+    Weed mix 3+    Mold 1 Negative    Mold 2 Negative    Mold 3 Negative    Mold 4 Negative    Cat --   +/-   Dog --   +/-         Food Adult Perc - 11/14/20 1400    Time Antigen Placed 1429    Allergen Manufacturer Lavella Hammock    Location Back    Number of allergen test 6    8. Shellfish Mix --   10x5   25. Shrimp --   24x10   26. Crab --   11x3   27. Lobster --   16x6   28. Oyster --   4x4   29. Scallops Negative           Past Medical History: Patient Active Problem List   Diagnosis Date Noted  . Adverse reaction to food, subsequent encounter 11/14/2020  . Other allergic rhinitis 11/14/2020  . Allergic conjunctivitis of both eyes 11/14/2020  . Rash and other nonspecific skin eruption 11/14/2020  . Controlled type 2 diabetes mellitus without complication ([place]) 66/29/4765  . Influenza vaccination given 05/22/2017  . Essential hypertension 08/22/2016  . Asthma 08/22/2016  . Eczema 08/22/2016   Past Medical  History:  Diagnosis Date  . Asthma   . Hypertension    Past Surgical History: Past Surgical History:  Procedure Laterality Date  . DENTAL SURGERY     Medication List:  Current Outpatient Medications  Medication Sig Dispense Refill  . albuterol (VENTOLIN HFA) 108 (90 Base) MCG/ACT inhaler Inhale 1 puff into the lungs every 6 (six) hours as needed for wheezing or shortness of breath. 8 g 1  . amLODipine (NORVASC) 10 MG tablet Take 1 tablet (10 mg total) by mouth daily. 90 tablet 1  . Cetirizine HCl (ZYRTEC ALLERGY) 10 MG CAPS Take 1 capsule (10 mg total) by mouth daily. 30 capsule 5  . EPINEPHrine 0.3 mg/0.3 mL IJ SOAJ injection Inject 0.3 mg into the muscle as needed for anaphylaxis. 1 each 2  . lisinopril-hydrochlorothiazide (ZESTORETIC) 20-25 MG tablet Take 1 tablet by mouth daily. 90 tablet 1  . metFORMIN (GLUCOPHAGE) 500 MG tablet TAKE 1 TABLET BY MOUTH TWICE DAILY WITH MEALS 180 tablet 0  . cloNIDine (CATAPRES) 0.1 MG tablet Take 0.5 tablets (0.05 mg total) by mouth at bedtime. X 2 weeks. Then 1/2 pill by mouth every other night x 2 weeks then d/c (Patient not taking: Reported on 11/14/2020) 30 tablet 0  . cyclobenzaprine (FLEXERIL) 10 MG tablet Take 1 tablet (10 mg total) by mouth at bedtime. (Patient not taking: No sig reported) 5 tablet 0   No current facility-administered medications for this visit.   Allergies: No Known Allergies Social History: Social History   Socioeconomic History  . Marital status: Single    Spouse name: Not on file  . Number of children: Not on file  . Years of education: Not on file  . Highest education level: Not on file  Occupational History  . Not on file  Tobacco Use  . Smoking status: Never Smoker  . Smokeless tobacco: Never Used  Vaping Use  . Vaping Use: Never used  Substance and Sexual Activity  . Alcohol use: No  . Drug use: No  . Sexual activity: Never  Other Topics Concern  . Not on file  Social History Narrative  . Not on  file   Social Determinants of Health   Financial Resource Strain: Not on file  Food Insecurity: Not on file  Transportation Needs: Not on file  Physical Activity: Not on file  Stress: Not on file  Social Connections: Not on file   Lives in an apartment. Smoking: denies Occupation: not employed  Environmental HistoryFreight forwarder in the house: no Charity fundraiser in the family room: yes Carpet in the bedroom: yes Heating: electric Cooling: central Pet: no  Family History: Family History  Problem Relation Age of Onset  . Diabetes Mother   . Cancer Father   . Cancer Sister   . Asthma Sister    Review of Systems  Constitutional: Negative for appetite change, chills, fever and unexpected weight change.  HENT: Negative for congestion and rhinorrhea.   Eyes: Positive for itching.  Respiratory: Negative for cough, chest tightness, shortness of breath and wheezing.   Cardiovascular: Negative for chest pain.  Gastrointestinal: Negative for abdominal pain.  Genitourinary: Negative for difficulty urinating.  Skin: Positive for rash.  Allergic/Immunologic: Positive for environmental allergies and food allergies.  Neurological: Negative for headaches.   Objective: BP (!) 180/146   Pulse 97   Temp (!) 97.3 F (36.3 C)   Resp 16   Ht 5\' 5"  (1.651 m)   Wt (!) 322 lb 3.2 oz (146.1 kg)  SpO2 96%   BMI 53.62 kg/m  Body mass index is 53.62 kg/m. Physical Exam Vitals and nursing note reviewed.  Constitutional:      Appearance: Normal appearance. He is well-developed.  HENT:     Head: Normocephalic and atraumatic.     Right Ear: External ear normal.     Left Ear: External ear normal.     Nose: Nose normal.     Mouth/Throat:     Mouth: Mucous membranes are moist.     Pharynx: Oropharynx is clear.  Eyes:     Conjunctiva/sclera: Conjunctivae normal.  Cardiovascular:     Rate and Rhythm: Normal rate and regular rhythm.     Heart sounds: Normal heart sounds. No murmur  heard. No friction rub. No gallop.   Pulmonary:     Effort: Pulmonary effort is normal.     Breath sounds: Normal breath sounds. No wheezing, rhonchi or rales.  Musculoskeletal:     Cervical back: Neck supple.  Skin:    General: Skin is warm.     Findings: Rash present.     Comments: Erythematous hue on the face.  Neurological:     Mental Status: He is alert and oriented to person, place, and time.  Psychiatric:        Behavior: Behavior normal.    The plan was reviewed with the patient/family, and all questions/concerned were addressed.  It was my pleasure to see Edward Carr today and participate in his care. Please feel free to contact me with any questions or concerns.  Sincerely,  Rexene Alberts, DO Allergy & Immunology  [facility] office: [place] office: [phone]

## 2020-11-14 NOTE — Assessment & Plan Note (Signed)
Some rhino conjunctivitis symptoms in the spring. No prior work up and does not take medications for this.  Today's skin testing showed: Positive to grass, trees, ragweed, weed pollen, dust mites, cockroach. Borderline to cat and dog.   Start environmental control measures as below.  May use over the counter antihistamines such as Zyrtec (cetirizine) 10mg  daily as needed.  Question if the periorbital swelling in the HPI was due to environmental allergy exposure rather than food allergies given timeline of events.

## 2020-11-14 NOTE — Assessment & Plan Note (Signed)
   See assessment and plan as above for allergic rhinitis.  

## 2020-12-13 ENCOUNTER — Other Ambulatory Visit: Payer: Self-pay

## 2020-12-13 ENCOUNTER — Encounter: Payer: Self-pay | Admitting: Family Medicine

## 2020-12-13 ENCOUNTER — Ambulatory Visit (INDEPENDENT_AMBULATORY_CARE_PROVIDER_SITE_OTHER): Payer: Self-pay | Admitting: Family Medicine

## 2020-12-13 VITALS — BP 134/95 | HR 82 | Temp 98.0°F | Ht 65.0 in | Wt 323.0 lb

## 2020-12-13 DIAGNOSIS — G8929 Other chronic pain: Secondary | ICD-10-CM

## 2020-12-13 DIAGNOSIS — E119 Type 2 diabetes mellitus without complications: Secondary | ICD-10-CM

## 2020-12-13 DIAGNOSIS — I10 Essential (primary) hypertension: Secondary | ICD-10-CM

## 2020-12-13 DIAGNOSIS — M25562 Pain in left knee: Secondary | ICD-10-CM

## 2020-12-13 DIAGNOSIS — Z6841 Body Mass Index (BMI) 40.0 and over, adult: Secondary | ICD-10-CM

## 2020-12-13 DIAGNOSIS — M25561 Pain in right knee: Secondary | ICD-10-CM

## 2020-12-13 HISTORY — DX: Body Mass Index (BMI) 40.0 and over, adult: Z684

## 2020-12-13 HISTORY — DX: Morbid (severe) obesity due to excess calories: E66.01

## 2020-12-13 LAB — POCT URINALYSIS DIPSTICK
Bilirubin, UA: NEGATIVE
Blood, UA: NEGATIVE
Glucose, UA: NEGATIVE
Ketones, UA: NEGATIVE
Leukocytes, UA: NEGATIVE
Nitrite, UA: NEGATIVE
Protein, UA: NEGATIVE
Spec Grav, UA: 1.025 (ref 1.010–1.025)
Urobilinogen, UA: 0.2 E.U./dL
pH, UA: 5 (ref 5.0–8.0)

## 2020-12-13 LAB — POCT GLYCOSYLATED HEMOGLOBIN (HGB A1C)
HbA1c POC (<> result, manual entry): 6.1 % (ref 4.0–5.6)
HbA1c, POC (controlled diabetic range): 6.1 % (ref 0.0–7.0)
HbA1c, POC (prediabetic range): 6.1 % (ref 5.7–6.4)
Hemoglobin A1C: 6.1 % — AB (ref 4.0–5.6)

## 2020-12-13 LAB — GLUCOSE, POCT (MANUAL RESULT ENTRY): POC Glucose: 96 mg/dl (ref 70–99)

## 2020-12-13 MED ORDER — MELOXICAM 7.5 MG PO TABS: 7.5000 mg | ORAL_TABLET | Freq: Every day | ORAL | 0 refills | Status: DC

## 2020-12-13 NOTE — Patient Instructions (Addendum)
Ketogenic Eating Plan, Adult A ketogenic eating plan is a diet that is very low in carbohydrates, moderately low in protein, and very high in fat. Your body normally gets energy from eating carbohydrates. If you limit carbohydrates in your diet, your body will start to use stored fat as an energy source. When fat is broken down for energy, it enters your blood as a substance called ketones. A ketogenic eating plan relies mostly on ketones for energy. This eating plan can cause rapid weight loss because the body burns fat for fuel. What are the benefits of a ketogenic eating plan? Epilepsy Ketogenic eating plans have been well studied as a treatment for epilepsy in children and have been used for many years. These plans have been studied to treat epilepsy in adults too. You will want to work closely with a dietitian if your health care provider suggests this eating plan to manage your seizures. Other conditions Ketogenic eating plans have also been studied to help treat other conditions, including:  Cancer.  Type 2 diabetes.  Alzheimer's disease.  Parkinson's disease.  Autism.  Multiple sclerosis. More long-term studies are needed to learn the effects of this eating plan on people with these and other conditions. What are the side effects of a ketogenic eating plan? Side effects of a ketogenic diet may include:  Vitamin deficiencies.  Dehydration.  Bad breath.  Nausea.  Hunger.  Constipation.  Problems with menstrual periods.  Inflammation of the pancreas.  Kidney stones or gallbladder stones.  Bone fractures.  High cholesterol. What are tips for following this plan? Reading food labels  Look for foods that have a low glycemic index (GI) label.  Read a product's ingredient list to check for hidden or added sugar.  Check food labels for the number of grams of carbohydrates and protein in each serving. This is important. Cooking  Carefully measure or weigh  foods.  Make desserts using ketogenic or low GI recipes.  Avoid cooking with sauces that have added sugar, such as barbecue sauce or ketchup. Meal planning  There are several versions of ketogenic eating plans. The classic ketogenic diet recommends that 90% of your calories come from fat, 6% from protein, and 4% from carbohydrates.  Aim for a daily meal and snack schedule that you can follow steadily.  Every meal will include high-fat items, such as avocado, cream, butter, or mayonnaise.  Each day, you should have: ? Fat: __________g. ? Protein: _________g. ? Carbohydrates: _________g. General information  Buy a gram scale to weigh foods. This is needed to follow this diet correctly. Your dietitian will teach you how to use it.  Ask your health care provider what steps you can take to avoid side effects of this eating plan, such as constipation, dehydration, and kidney stones.  Take vitamin and mineral supplements only as told by your health care provider or dietitian.  Work with your dietitian and health care provider to help you stay on track.   What foods should I eat? Fruits Fresh blueberries, blackberries, or raspberries. Other fresh and frozen fruits in small amounts. Vegetables Lettuce. Bok choy. Eggplant. Tomatoes. Cucumbers. Peppers. Cauliflower. Zucchini. Fennel. Swiss chard. Grains Almond, hazelnut, or coconut flours. Meats and other proteins Meat, poultry, and fish. Bacon. Eggs. Egg substitutes. Nuts, nut butters (without added sugar), seeds, lentils, and split peas in small amounts. Dairy Cheese in moderate amounts. Beverages Plain or mineral water. Sugar-free, caffeine-free beverages. Club soda. Caffeine-free, carbohydrate-free herbal tea. Fats and oils Avocado. Cream. Sour cream.  Cream cheese. Butter. Plant-based oils, such as olive, canola, and sunflower. Margarine. Mayonnaise. Sweets and desserts Any homemade sweets or desserts made using ketogenic diet  recipes. The items listed above may not be a complete list of recommended foods and beverages. Contact a dietitian for more information.   What foods should I avoid? Fruits Fruit juice. Fruits packed in syrups. Dried or candied fruits. Vegetables Corn. Potatoes (all kinds). Peas. Grains All bread, dry cereals, and cooked cereals with added sugar. Baked goods. Crackers and pretzels. Meats and other proteins Meat, poultry, or fish prepared with flour or breading. Nut butters with added sugar. Beans. Dairy Milk. Yogurt. Fats and oils Salad dressings with added sugar. Gravies. Beverages Sugar-sweetened teas, coffee drinks, or soft drinks. Juice. Sports drinks. Sweets and desserts All sweets and desserts, unless the dessert is homemade using ketogenic diet recipes. The items listed above may not be a complete list of foods and beverages to avoid. Contact a dietitian for more information. Summary  A ketogenic eating plan is a diet that is very low in carbohydrates, moderately low in protein, and very high in fat.  Aim for a daily meal and snack schedule that you can follow steadily.  Buy a gram scale to weigh foods. This is needed to follow this diet correctly. Your dietitian will teach you how to use it.  Work closely with your health care provider and a dietitian while you are following a ketogenic eating plan. This information is not intended to replace advice given to you by your health care provider. Make sure you discuss any questions you have with your health care provider. Document Revised: 11/26/2019 Document Reviewed: 11/26/2019 Elsevier Patient Education  2021 [place]. Meloxicam capsules What is this medicine? MELOXICAM (mel OX i cam) is a non-steroidal anti-inflammatory drug, also known as an NSAID. It is used to treat pain, inflammation, and swelling. This medicine may be used for other purposes; ask your health care provider or pharmacist if you have  questions. COMMON BRAND NAME(S): Vivlodex What should I tell my health care provider before I take this medicine? They need to know if you have any of these conditions:  asthma (lung or breathing disease)  bleeding disorder  coronary artery bypass graft (CABG) within the past 2 weeks  dehydration  heart attack  heart disease  heart failure  high blood pressure  if you often drink alcohol  kidney disease  liver disease  smoke tobacco cigarettes  stomach bleeding  stomach ulcers, other stomach or intestine problems  take medicines that treat or prevent blood clots  taking other steroids like dexamethasone or prednisone  an unusual or allergic reaction to meloxicam, other medicines, foods, dyes, or preservatives  pregnant or trying to get pregnant  breast-feeding How should I use this medicine? Take this medicine by mouth. Take it as directed on the prescription label at the same time every day. You can take it with or without food. If it upsets your stomach, take it with food. Do not use it more often than directed. There may be unused or extra doses in the bottle after you finish your treatment. Talk to your health care provider if you have questions about your dose. A special MedGuide will be given to you by the pharmacist with each prescription and refill. Be sure to read this information carefully each time. Talk to your health care provider about the use of this medicine in children. Special care may be needed. Patients over 1 years of age  may have a stronger reaction and need a smaller dose. Overdosage: If you think you have taken too much of this medicine contact a poison control center or emergency room at once. NOTE: This medicine is only for you. Do not share this medicine with others. What if I miss a dose? If you miss a dose, take it as soon as you can. If it is almost time for your next dose, take only that dose. Do not take double or extra doses. What  may interact with this medicine? Do not take this medicine with any of the following medications:  cidofovir  ketorolac This medicine may also interact with the following medications:  aspirin and aspirin-like medicines  certain medicines for blood pressure, heart disease, irregular heart beat  certain medicines for depression, anxiety, or psychotic disturbances  certain medicines that treat or prevent blood clots like warfarin, enoxaparin, dalteparin, apixaban, dabigatran, rivaroxaban  cyclosporine  diuretics  fluconazole  lithium  methotrexate  other NSAIDs, medicines for pain and inflammation, like ibuprofen and naproxen  pemetrexed This list may not describe all possible interactions. Give your health care provider a list of all the medicines, herbs, non-prescription drugs, or dietary supplements you use. Also tell them if you smoke, drink alcohol, or use illegal drugs. Some items may interact with your medicine. What should I watch for while using this medicine? Visit your health care provider for regular checks on your progress. Tell your health care provider if your symptoms do not start to get better or if they get worse. Do not take other medicines that contain aspirin, ibuprofen, or naproxen with this medicine. Side effects such as stomach upset, nausea, or ulcers may be more likely to occur. Many non-prescription medicines contain aspirin, ibuprofen, or naproxen. Always read labels carefully. This medicine can cause serious ulcers and bleeding in the stomach. It can happen with no warning. Smoking, drinking alcohol, older age, and poor health can also increase risks. Call your health care provider right away if you have stomach pain or blood in your vomit or stool. This medicine does not prevent a heart attack or stroke. This medicine may increase the chance of a heart attack or stroke. The chance may increase the longer you use this medicine or if you have heart disease.  If you take aspirin to prevent a heart attack or stroke, talk to your health care provider about using this medicine. Alcohol may interfere with the effect of this medicine. Avoid alcoholic drinks. This medicine may cause serious skin reactions. They can happen weeks to months after starting the medicine. Contact your health care provider right away if you notice fevers or flu-like symptoms with a rash. The rash may be red or purple and then turn into blisters or peeling of the skin. Or, you might notice a red rash with swelling of the face, lips or lymph nodes in your neck or under your arms. Talk to your health care provider if you are pregnant before taking this medicine. Taking this medicine between weeks 20 and 30 of pregnancy may harm your unborn baby. Your health care provider will monitor you closely if you need to take it. After 30 weeks of pregnancy, do not take this medicine. You may get drowsy or dizzy. Do not drive, use machinery, or do anything that needs mental alertness until you know how this medicine affects you. Do not stand up or sit up quickly, especially if you are an older patient. This reduces the risk of dizzy  or fainting spells. Be careful brushing or flossing your teeth or using a toothpick because you may get an infection or bleed more easily. If you have any dental work done, tell your dentist you are receiving this medicine. This medicine may make it more difficult to get pregnant. Talk to your health care provider if you are concerned about your fertility. What side effects may I notice from receiving this medicine? Side effects that you should report to your doctor or health care provider as soon as possible:  allergic reactions (skin rash, itching or hives; swelling of the face, lips, or tongue)  bleeding (bloody or black, tarry stools; red or dark brown urine; spitting up blood or brown material that looks like coffee grounds; red spots on the skin; unusual bruising or  bleeding from the eyes, gums, or nose)  blood clot (chest pain; shortness of breath; pain, swelling, or warmth in the leg)  general ill feeling or flu-like symptoms  high potassium levels (chest pain; fast, irregular heartbeat; muscle weakness)  kidney injury (trouble passing urine or change in the amount of urine)  light-colored stool  liver injury (dark yellow or brown urine; general ill feeling or flu-like symptoms; loss of appetite, right upper belly pain; unusually weak or tired, yellowing of the eyes or skin)  low red blood cell counts (trouble breathing; feeling faint; lightheaded, falls; unusually weak or tired)  rash, fever, and swollen lymph nodes  redness, blistering, peeling, or loosening of the skin, including inside the mouth  stroke (changes in vision; confusion; trouble speaking or understanding; severe headaches; sudden numbness or weakness of the face, arm or leg; trouble walking; dizziness; loss of balance or coordination) Side effects that usually do not require medical attention (report to your doctor or health care provider if they continue or are bothersome):  constipation  diarrhea  dizziness  gas  headache  heartburn  nausea, vomiting This list may not describe all possible side effects. Call your doctor for medical advice about side effects. You may report side effects to FDA at 1-800-FDA-1088. Where should I keep my medicine? Keep out of the reach of children and pets. Store at room temperature between 20 and 25 degrees C (68 and 77 degrees F). Keep this drug in the original container. Protect from moisture. Keep the container tightly closed. Get rid of any unused medicine after the expiration date. To get rid of medicines that are no longer needed or have expired:  Take the medicine to a medicine take-back program. Check with your pharmacy or law enforcement to find a location.  If you cannot return the medicine, check the label or package  insert to see if the medicine should be thrown out in the garbage or flushed down the toilet. If you are not sure, ask your health care provider. If it is safe to put it in the trash, empty the medicine out of the container. Mix the medicine with cat litter, dirt, coffee grounds, or other unwanted substance. Seal the mixture in a bag or container. Put it in the trash. NOTE: This sheet is a summary. It may not cover all possible information. If you have questions about this medicine, talk to your doctor, pharmacist, or health care provider.  2021 Elsevier/Gold Standard (2020-06-27 15:21:19)

## 2020-12-13 NOTE — Progress Notes (Signed)
[name]   Established Patient Office Visit  Subjective:  Patient ID: Edward Carr, male    DOB: 23-Nov-1975  Age: 45 y.o. MRN: [id number]  CC:  Chief Complaint  Patient presents with  . Follow-up    3 month follow up, left knee pain after moving around for long time.     HPI Edward Carr is a 45 year old male with a medical history significant for type 2 diabetes mellitus, hypertension, hyperlipidemia, mild intermittent asthma, and morbid obesity that presents for follow-up of chronic conditions.  Patient says that he has been doing well and is without complaint on today.  He has been taking all medications consistently.  He has been attempting to follow a low carbohydrate diet without success.  He does not exercise consistently.  Patient is not up-to-date with yearly eye exam due to insurance constraints.  He denies any headache, blurry vision, polyuria, polydipsia, polyphagia, bilateral lower extremity edema, or chest pain.  Past Medical History:  Diagnosis Date  . Asthma   . Hypertension     Past Surgical History:  Procedure Laterality Date  . DENTAL SURGERY      Family History  Problem Relation Age of Onset  . Diabetes Mother   . Cancer Father   . Cancer Sister   . Asthma Sister     Social History   Socioeconomic History  . Marital status: Single    Spouse name: Not on file  . Number of children: Not on file  . Years of education: Not on file  . Highest education level: Not on file  Occupational History  . Not on file  Tobacco Use  . Smoking status: Never Smoker  . Smokeless tobacco: Never Used  Vaping Use  . Vaping Use: Never used  Substance and Sexual Activity  . Alcohol use: No  . Drug use: No  . Sexual activity: Never  Other Topics Concern  . Not on file  Social History Narrative  . Not on file   Social Determinants of Health   Financial Resource Strain: Not on file  Food Insecurity: Not on  file  Transportation Needs: Not on file  Physical Activity: Not on file  Stress: Not on file  Social Connections: Not on file  Intimate Partner Violence: Not on file    Outpatient Medications Prior to Visit  Medication Sig Dispense Refill  . albuterol (VENTOLIN HFA) 108 (90 Base) MCG/ACT inhaler Inhale 1 puff into the lungs every 6 (six) hours as needed for wheezing or shortness of breath. 8 g 1  . amLODipine (NORVASC) 10 MG tablet Take 1 tablet (10 mg total) by mouth daily. 90 tablet 1  . cloNIDine (CATAPRES) 0.1 MG tablet Take 0.5 tablets (0.05 mg total) by mouth at bedtime. X 2 weeks. Then 1/2 pill by mouth every other night x 2 weeks then d/c 30 tablet 0  . EPINEPHrine 0.3 mg/0.3 mL IJ SOAJ injection Inject 0.3 mg into the muscle as needed for anaphylaxis. 1 each 2  . lisinopril-hydrochlorothiazide (ZESTORETIC) 20-25 MG tablet Take 1 tablet by mouth daily. 90 tablet 1  . metFORMIN (GLUCOPHAGE) 500 MG tablet TAKE 1 TABLET BY MOUTH TWICE DAILY WITH MEALS 180 tablet 0  . Cetirizine HCl (ZYRTEC ALLERGY) 10 MG CAPS Take 1 capsule (10 mg total) by mouth daily. (Patient not taking: Reported on 12/13/2020) 30 capsule 5  . cyclobenzaprine (FLEXERIL) 10 MG tablet Take 1 tablet (10 mg total) by mouth at bedtime. (Patient  not taking: No sig reported) 5 tablet 0   No facility-administered medications prior to visit.    Allergies  Allergen Reactions  . Crab Extract Allergy Skin Test   . Shellfish Allergy   . Shrimp Extract Allergy Skin Test     ROS Review of Systems  Constitutional: Negative.   HENT: Negative.   Eyes: Negative.   Respiratory: Negative.   Cardiovascular: Negative.   Gastrointestinal: Negative.   Genitourinary: Negative.   Musculoskeletal: Negative.   Neurological: Negative.   Hematological: Negative.   Psychiatric/Behavioral: Negative.       Objective:    Physical Exam Constitutional:      Appearance: He is obese.  HENT:     Mouth/Throat:     Mouth: Mucous  membranes are moist.  Eyes:     Pupils: Pupils are equal, round, and reactive to light.  Cardiovascular:     Rate and Rhythm: Normal rate.  Abdominal:     General: Bowel sounds are normal.  Musculoskeletal:        General: Normal range of motion.  Skin:    General: Skin is warm.  Neurological:     General: No focal deficit present.     Mental Status: He is alert. Mental status is at baseline.  Psychiatric:        Mood and Affect: Mood normal.        Behavior: Behavior normal.        Thought Content: Thought content normal.        Judgment: Judgment normal.     BP (!) 134/95 (BP Location: Right Arm)   Pulse 82   Temp 98 F (36.7 C)   Ht 5\' 5"  (1.651 m)   Wt (!) 323 lb 0.4 oz (146.5 kg)   SpO2 99%   BMI 53.75 kg/m  Wt Readings from Last 3 Encounters:  12/13/20 (!) 323 lb 0.4 oz (146.5 kg)  11/14/20 (!) 322 lb 3.2 oz (146.1 kg)  10/17/20 (!) 316 lb 11.2 oz (143.7 kg)     There are no preventive care reminders to display for this patient.  There are no preventive care reminders to display for this patient.  Lab Results  Component Value Date   TSH 1.430 12/11/2019   Lab Results  Component Value Date   WBC 10.3 12/11/2019   HGB 13.7 12/11/2019   HCT 40.7 12/11/2019   MCV 82 12/11/2019   PLT 395 12/11/2019   Lab Results  Component Value Date   NA 138 09/13/2020   K 4.2 09/13/2020   CO2 22 09/13/2020   GLUCOSE 99 09/13/2020   BUN 14 09/13/2020   CREATININE 1.23 09/13/2020   BILITOT 0.5 09/13/2020   ALKPHOS 80 09/13/2020   AST 17 09/13/2020   ALT 19 09/13/2020   PROT 7.8 09/13/2020   ALBUMIN 4.6 09/13/2020   CALCIUM 10.1 09/13/2020   Lab Results  Component Value Date   CHOL 149 04/15/2019   Lab Results  Component Value Date   HDL 31 (L) 04/15/2019   Lab Results  Component Value Date   LDLCALC 92 04/15/2019   Lab Results  Component Value Date   TRIG 146 04/15/2019   Lab Results  Component Value Date   CHOLHDL 4.8 04/15/2019   Lab Results   Component Value Date   HGBA1C 6.1 (A) 12/13/2020   HGBA1C 6.1 12/13/2020   HGBA1C 6.1 12/13/2020   HGBA1C 6.1 12/13/2020      Assessment & Plan:   Problem List Items  Addressed This Visit      Cardiovascular and Mediastinum   Essential hypertension   Relevant Orders   Urinalysis Dipstick (Completed)     Other   Morbid obesity with BMI of 50.0-59.9, adult ([place])    Other Visit Diagnoses    Type 2 diabetes mellitus without complication, without long-term current use of insulin ([place])    -  Primary   Relevant Orders   Urinalysis Dipstick (Completed)   HgB A1c (Completed)   Glucose (CBG) (Completed)   Chronic pain of both knees       Relevant Medications   meloxicam (MOBIC) 7.5 MG tablet      Meds ordered this encounter  Medications  . meloxicam (MOBIC) 7.5 MG tablet    Sig: Take 1 tablet (7.5 mg total) by mouth daily.    Dispense:  90 tablet    Refill:  0    Order Specific Question:   Supervising Provider    Answer:   Angelica Chessman E [[id number]]    2. Type 2 diabetes mellitus without complication, without long-term current use of insulin ([facility]) Remains stable, no medication changes warranted on today - Urinalysis Dipstick - HgB A1c - Glucose (CBG)  2. Essential hypertension - Urinalysis Dipstick  3. Chronic pain of both knees - meloxicam (MOBIC) 7.5 MG tablet; Take 1 tablet (7.5 mg total) by mouth daily.  Dispense: 90 tablet; Refill: 0  4. Morbid obesity with BMI of 50.0-59.9, adult [facility]) The patient is asked to make an attempt to improve diet and exercise patterns to aid in medical management of this problem.  Follow-up: Return in about 3 months (around 03/15/2021) for obesity, diabetes, hypertension.    [name]  APRN, MSN, FNP-C [facility] [address] [place], [place] [address] [phone]

## 2021-01-12 ENCOUNTER — Other Ambulatory Visit: Payer: Self-pay | Admitting: Family Medicine

## 2021-01-12 ENCOUNTER — Telehealth: Payer: Self-pay

## 2021-01-12 DIAGNOSIS — E119 Type 2 diabetes mellitus without complications: Secondary | ICD-10-CM

## 2021-01-12 NOTE — Telephone Encounter (Signed)
Updated referral [facility] nutrition and diabetics education in [facility]

## 2021-01-12 NOTE — Progress Notes (Signed)
Orders Placed This Encounter  Procedures   Ambulatory referral to diabetic education    Referral Priority:   Routine    Referral Type:   Consultation    Referral Reason:   Specialty Services Required    Number of Visits Requested:   Darke, MSN, FNP-C [facility] [address] [place], [place] [address] [phone]

## 2021-01-16 ENCOUNTER — Other Ambulatory Visit: Payer: Self-pay

## 2021-01-16 ENCOUNTER — Encounter: Payer: Self-pay | Admitting: Dietician

## 2021-01-16 ENCOUNTER — Encounter: Payer: Self-pay | Attending: Nurse Practitioner | Admitting: Dietician

## 2021-01-16 DIAGNOSIS — E119 Type 2 diabetes mellitus without complications: Secondary | ICD-10-CM | POA: Insufficient documentation

## 2021-01-16 NOTE — Progress Notes (Signed)
Diabetes Self-Management Education  Visit Type: Follow-up  Appt. Start Time: 1400 Appt. End Time: 1430  01/16/2021  [name], identified by name and date of birth, is a 45 y.o. male with a diagnosis of Diabetes:  .   ASSESSMENT  Height 5\' 5"  (1.651 m), weight (!) 320 lb 14.4 oz (145.6 kg). Body mass index is 53.4 kg/m.  Pt reports starting a new medication for their knee, pain is better but not as much as they would want. Pt reports walking more, walks to the store a couple blocks away a few times a week instead of getting a ride.  Pt states their knee doesn't bother them that much on their walks. Pt reports eating lunch more often, has been trying to make their meals fit the balanced plate model. Incorporating more vegetables. Pt reports drinking more water, but still drinks diet soda, orange juice, and lemonade. 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 Pt keeps the box of crackers at their work station, but states they will only eat "what they need".  Pt reports difficulty moderating carbs, states that if they find something they like they will overindulge. Pt is still searching for graphic design work, is considering a physical labor job as well.  A1c is down to 6.1 from 6.2 3 months ago   Diabetes Self-Management Education - 01/16/21 1410       Visit Information   Visit Type Follow-up      Complications   How often do you check your blood sugar? 0 times/day (not testing)    Have you had a dilated eye exam in the past 12 months? No   Was supposed to set up an eye exam, didn't get back to them.     Dietary Intake   Breakfast cheeseburger on an onion roll    Snack (morning) Ritz crackers    Lunch 3 mini donuts    Dinner Steak, baked potato, broccoli and cheese    Snack (evening) --    Beverage(s) water, soda      Exercise   Exercise Type ADL's;Light (walking / raking leaves)      Patient Education   Monitoring Yearly dilated eye exam      Patient  Self-Evaluation of Goals - Patient rates self as meeting previously set goals (% of time)   Nutrition < 25%    Physical Activity < 25%    Medications >75%    Monitoring Not Applicable    Problem Solving < 25%    Reducing Risk < 25%    Health Coping < 25%      Post-Education Assessment   Patient understands the diabetes disease and treatment process. Needs Review    Patient understands incorporating nutritional management into lifestyle. Needs Review    Patient undertands incorporating physical activity into lifestyle. Needs Review    Patient understands using medications safely. Needs Review    Patient understands monitoring blood glucose, interpreting and using results Needs Review    Patient understands prevention, detection, and treatment of acute complications. Needs Review    Patient understands prevention, detection, and treatment of chronic complications. Needs Review    Patient understands how to develop strategies to address psychosocial issues. Needs Review    Patient understands how to develop strategies to promote health/change behavior. Needs Review      Outcomes   Expected Outcomes Demonstrated limited interest in learning.  Expect minimal changes    Future DMSE 3-4 months    Program Status Not  Completed      Subsequent Visit   Since your last visit have you continued or begun to take your medications as prescribed? Yes    Since your last visit have you experienced any weight changes? Gain    Weight Gain (lbs) 4    Since your last visit, are you checking your blood glucose at least once a day? N/A             Individualized Plan for Diabetes Self-Management Training:   Learning Objective:  Patient will have a greater understanding of diabetes self-management. Patient education plan is to attend individual and/or group sessions per assessed needs and concerns.   Plan:   Patient Instructions  Stand up every 30 minutes while you are doing your artwork and move  around for a couple of minutes.  Set a timer when you first sit down, and reset it after you sit back down.  Purchase Len Blalock in the small sleeves for a snack. Only take one sleeve with you when you do your artwork, leave the box in your kitchen cabinet.  Consider having a little bit of fruit as a snack instead of Rtiz. Try Graton, Fuji, or Gala red apples.  Look for diet or low sugar orange juice and lemonade.   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  Expected Outcomes:  Demonstrated limited interest in learning.  Expect minimal changes  If problems or questions, patient to contact team via:  Phone and Email  Future DSME appointment: 3-4 months

## 2021-01-16 NOTE — Patient Instructions (Addendum)
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  Set a timer when you first sit down, and reset it after you sit back down.  Purchase Len Blalock in the small sleeves for a snack. Only take one sleeve with you when you do your artwork, leave the box in your kitchen cabinet.  Consider having a little bit of fruit as a snack instead of Rtiz. Try Norton, Fuji, or Gala red apples.  Look for diet or low sugar orange juice and lemonade.   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

## 2021-01-23 IMAGING — CR DG KNEE COMPLETE 4+V*L*
4 series · 4 of 4 positions shown · non-contrast
Comparison: None.

CLINICAL DATA: Left knee pain for the past month.

EXAM:
LEFT KNEE - COMPLETE 4+ VIEW

[t knee ap left]
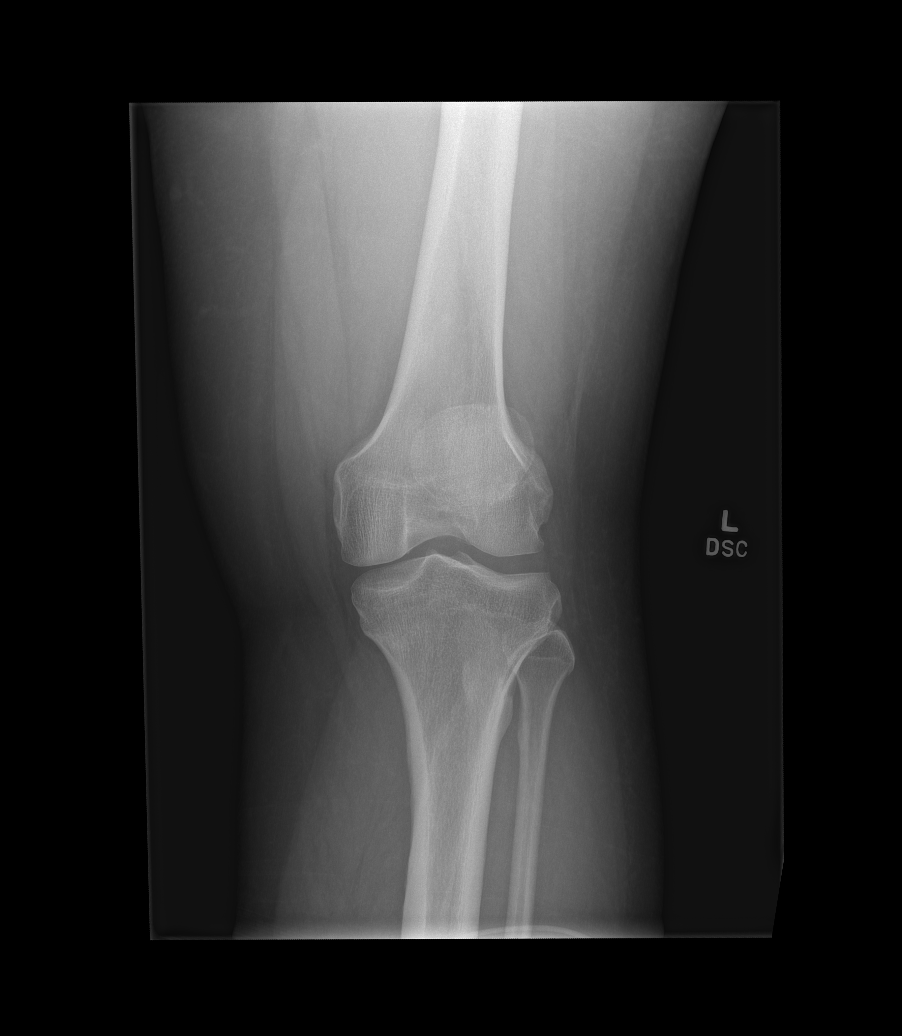

[t knee obl left (1 of 2)]
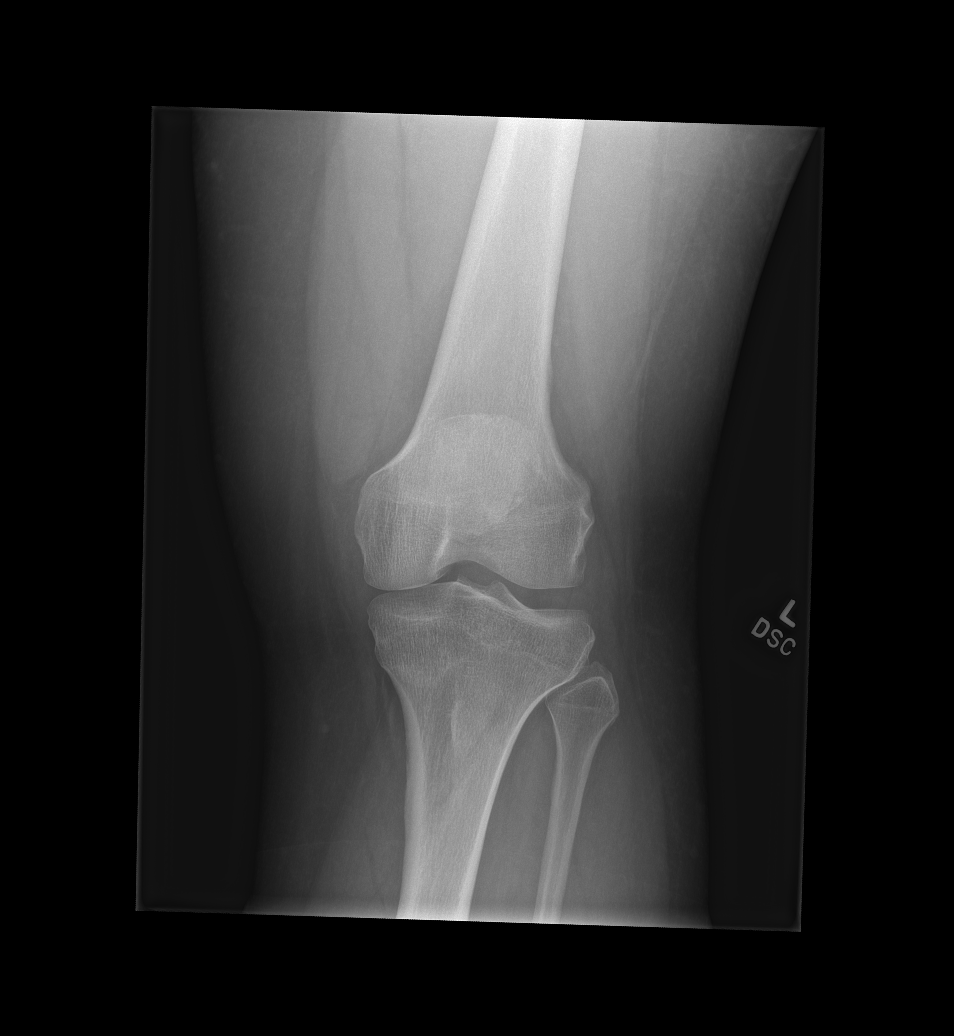

[t knee obl left (2 of 2)]
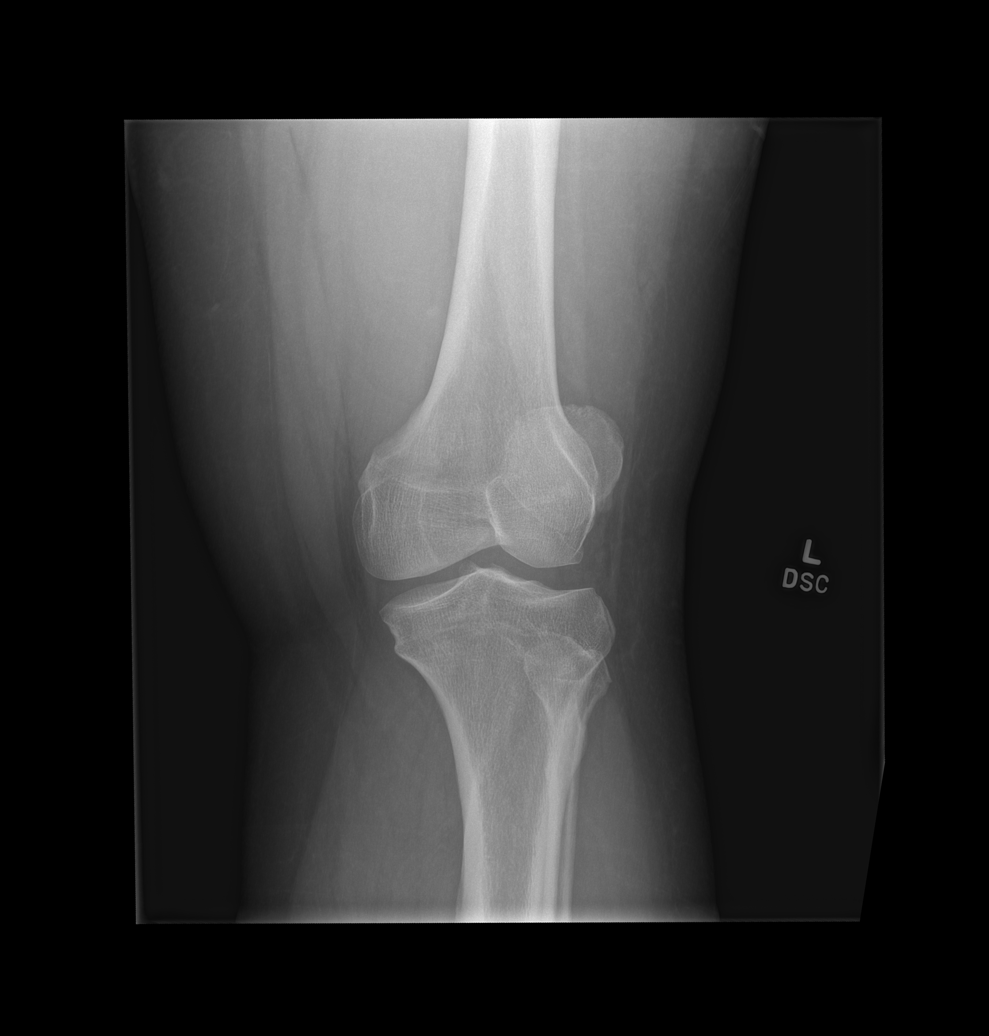

[t knee lat left]
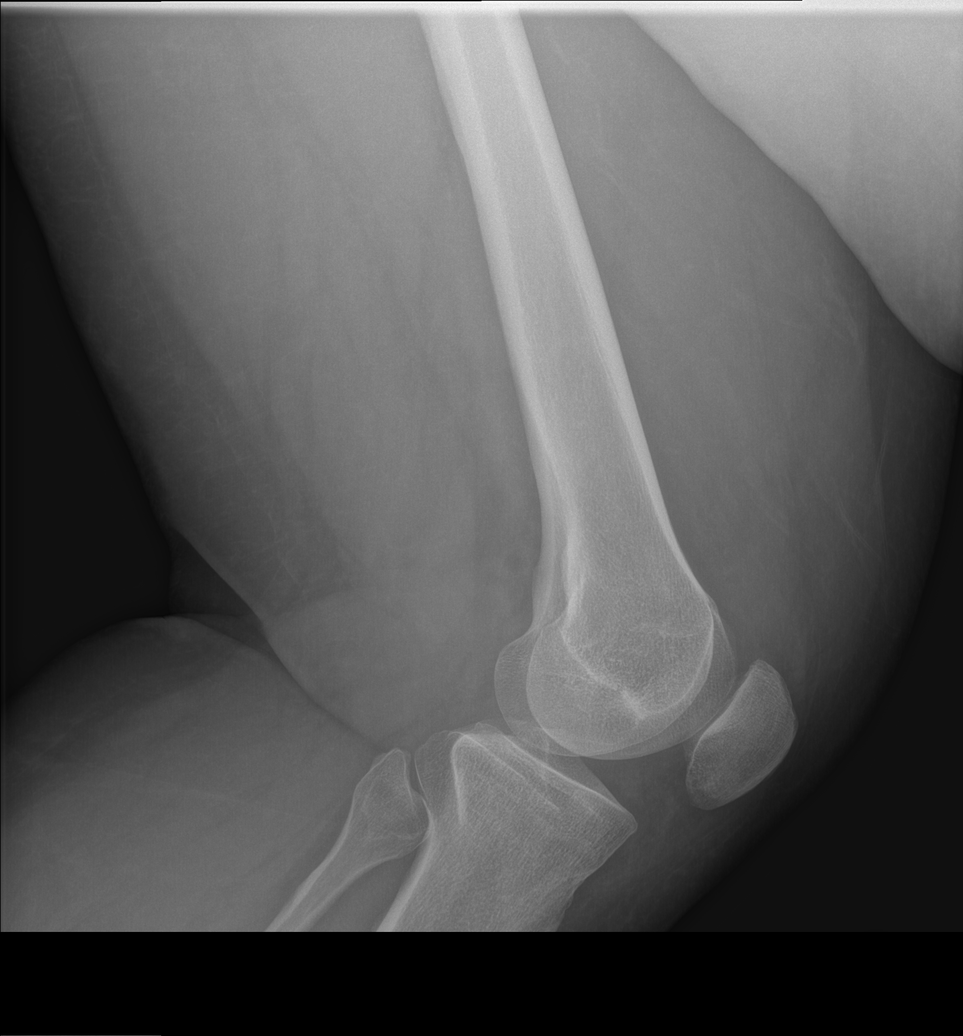

[4 of 4 positions shown; findings below may reference images not displayed]

FINDINGS: No evidence of fracture, dislocation, or joint effusion. No evidence
of arthropathy or other focal bone abnormality. Soft tissues are
unremarkable.
IMPRESSION: Negative.

## 2021-02-28 ENCOUNTER — Other Ambulatory Visit: Payer: Self-pay

## 2021-02-28 DIAGNOSIS — E119 Type 2 diabetes mellitus without complications: Secondary | ICD-10-CM

## 2021-02-28 MED ORDER — METFORMIN HCL 500 MG PO TABS: 500.0000 mg | ORAL_TABLET | Freq: Two times a day (BID) | ORAL | 0 refills | Status: DC

## 2021-03-20 ENCOUNTER — Ambulatory Visit (INDEPENDENT_AMBULATORY_CARE_PROVIDER_SITE_OTHER): Payer: No Typology Code available for payment source | Admitting: Allergy

## 2021-03-20 ENCOUNTER — Encounter: Payer: Self-pay | Admitting: Allergy

## 2021-03-20 ENCOUNTER — Other Ambulatory Visit: Payer: Self-pay

## 2021-03-20 VITALS — BP 132/98 | HR 92 | Temp 97.9°F | Resp 20 | Ht 65.0 in | Wt 321.0 lb

## 2021-03-20 DIAGNOSIS — T781XXD Other adverse food reactions, not elsewhere classified, subsequent encounter: Secondary | ICD-10-CM

## 2021-03-20 DIAGNOSIS — H1013 Acute atopic conjunctivitis, bilateral: Secondary | ICD-10-CM

## 2021-03-20 DIAGNOSIS — R21 Rash and other nonspecific skin eruption: Secondary | ICD-10-CM

## 2021-03-20 DIAGNOSIS — J4520 Mild intermittent asthma, uncomplicated: Secondary | ICD-10-CM

## 2021-03-20 DIAGNOSIS — J3089 Other allergic rhinitis: Secondary | ICD-10-CM

## 2021-03-20 DIAGNOSIS — H1010 Acute atopic conjunctivitis, unspecified eye: Secondary | ICD-10-CM | POA: Insufficient documentation

## 2021-03-20 DIAGNOSIS — J302 Other seasonal allergic rhinitis: Secondary | ICD-10-CM

## 2021-03-20 DIAGNOSIS — I10 Essential (primary) hypertension: Secondary | ICD-10-CM

## 2021-03-20 MED ORDER — SYMJEPI 0.3 MG/0.3ML IJ SOSY: 1.0000 | PREFILLED_SYRINGE | INTRAMUSCULAR | 1 refills | Status: DC | PRN

## 2021-03-20 MED ORDER — LEVOCETIRIZINE DIHYDROCHLORIDE 5 MG PO TABS: 5.0000 mg | ORAL_TABLET | Freq: Every evening | ORAL | 5 refills | Status: DC

## 2021-03-20 NOTE — Assessment & Plan Note (Signed)
Past history - Persistent facial rash even prior to the COVID-19 pandemic and mask wearing. No triggers noted. No prior dermatology evaluation. Interim history - unchanged and has not seen dermatology yet.  Not sure what's causing the rash.   See below for proper skin care.   Patient has appointment with his PCP in the upcoming week and advised him to ask for dermatology evaluation.

## 2021-03-20 NOTE — Assessment & Plan Note (Signed)
Elevated blood pressure reading in the office.   Follow up with PCP.

## 2021-03-20 NOTE — Patient Instructions (Addendum)
Environmental allergies: 2022 skin testing showed: Positive to grass, trees, ragweed, weed pollen, dust mites, cockroach. Borderline to cat and dog.  Continue environmental control measures as below. Use Xyzal (levocetirizine) daily as needed. May take twice a day during allergy flares. May switch antihistamines every few months.  Food allergy: Continue strict avoidance of shellfish and mollusks I have prescribed epinephrine injectable and demonstrated proper use. For mild symptoms you can take over the counter antihistamines such as Benadryl and monitor symptoms closely. If symptoms worsen or if you have severe symptoms including breathing issues, throat closure, significant swelling, whole body hives, severe diarrhea and vomiting, lightheadedness then inject epinephrine and seek immediate medical care afterwards. Let me know if the symjepi is too expensive.  Food action plan in place.   Asthma:  Today's breathing test showed some restriction due to body habitus. Sample of proair digihaler given and demonstrated proper use. May use albuterol rescue inhaler 2 puffs every 4 to 6 hours as needed for shortness of breath, chest tightness, coughing, and wheezing. May use albuterol rescue inhaler 2 puffs 5 to 15 minutes prior to strenuous physical activities. Monitor frequency of use.   Skin: Ask your PCP for dermatology referral. See below for proper skin care.   Follow up in 3 months or sooner if needed.   Follow up with PCP regarding your blood pressure.   Reducing Pollen Exposure Pollen seasons: trees (spring), grass (summer) and ragweed/weeds (fall). Keep windows closed in your home and car to lower pollen exposure.  Install air conditioning in the bedroom and throughout the house if possible.  Avoid going out in dry windy days - especially early morning. Pollen counts are highest between 5 - 10 AM and on dry, hot and windy days.  Save outside activities for late afternoon or after a  heavy rain, when pollen levels are lower.  Avoid mowing of grass if you have grass pollen allergy. Be aware that pollen can also be transported indoors on people and pets.  Dry your clothes in an automatic dryer rather than hanging them outside where they might collect pollen.  Rinse hair and eyes before bedtime. Control of House Dust Mite Allergen Dust mite allergens are a common trigger of allergy and asthma symptoms. While they can be found throughout the house, these microscopic creatures thrive in warm, humid environments such as bedding, upholstered furniture and carpeting. Because so much time is spent in the bedroom, it is essential to reduce mite levels there.  Encase pillows, mattresses, and box springs in special allergen-proof fabric covers or airtight, zippered plastic covers.  Bedding should be washed weekly in hot water (130 F) and dried in a hot dryer. Allergen-proof covers are available for comforters and pillows that can't be regularly washed.  Wash the allergy-proof covers every few months. Minimize clutter in the bedroom. Keep pets out of the bedroom.  Keep humidity less than 50% by using a dehumidifier or air conditioning. You can buy a humidity measuring device called a hygrometer to monitor this.  If possible, replace carpets with hardwood, linoleum, or washable area rugs. If that's not possible, vacuum frequently with a vacuum that has a HEPA filter. Remove all upholstered furniture and non-washable window drapes from the bedroom. Remove all non-washable stuffed toys from the bedroom.  Wash stuffed toys weekly. Cockroach Allergen Avoidance Cockroaches are often found in the homes of densely populated urban areas, schools or commercial buildings, but these creatures can lurk almost anywhere. This does not mean that you  have a dirty house or living area. Block all areas where roaches can enter the home. This includes crevices, wall cracks and windows.  Cockroaches need water  to survive, so fix and seal all leaky faucets and pipes. Have an exterminator go through the house when your family and pets are gone to eliminate any remaining roaches. Keep food in lidded containers and put pet food dishes away after your pets are done eating. Vacuum and sweep the floor after meals, and take out garbage and recyclables. Use lidded garbage containers in the kitchen. Wash dishes immediately after use and clean under stoves, refrigerators or toasters where crumbs can accumulate. Wipe off the stove and other kitchen surfaces and cupboards regularly. Pet Allergen Avoidance: Contrary to popular opinion, there are no "hypoallergenic" breeds of dogs or cats. That is because people are not allergic to an animal's hair, but to an allergen found in the animal's saliva, dander (dead skin flakes) or urine. Pet allergy symptoms typically occur within minutes. For some people, symptoms can build up and become most severe 8 to 12 hours after contact with the animal. People with severe allergies can experience reactions in public places if dander has been transported on the pet owners' clothing. Keeping an animal outdoors is only a partial solution, since homes with pets in the yard still have higher concentrations of animal allergens. Before getting a pet, ask your allergist to determine if you are allergic to animals. If your pet is already considered part of your family, try to minimize contact and keep the pet out of the bedroom and other rooms where you spend a great deal of time. As with dust mites, vacuum carpets often or replace carpet with a hardwood floor, tile or linoleum. High-efficiency particulate air (HEPA) cleaners can reduce allergen levels over time. While dander and saliva are the source of cat and dog allergens, urine is the source of allergens from rabbits, hamsters, mice and [place] pigs; so ask a non-allergic family member to clean the animal's cage. If you have a pet allergy, talk  to your allergist about the potential for allergy immunotherapy (allergy shots). This strategy can often provide long-term relief.  Skin care recommendations  Bath time: Always use lukewarm water. AVOID very hot or cold water. Keep bathing time to 5-10 minutes. Do NOT use bubble bath. Use a mild soap and use just enough to wash the dirty areas. Do NOT scrub skin vigorously.  After bathing, pat dry your skin with a towel. Do NOT rub or scrub the skin.  Moisturizers and prescriptions:  ALWAYS apply moisturizers immediately after bathing (within 3 minutes). This helps to lock-in moisture. Use the moisturizer several times a day over the whole body. Good summer moisturizers include: Aveeno, CeraVe, Cetaphil. Good winter moisturizers include: Aquaphor, Vaseline, Cerave, Cetaphil, Eucerin, Vanicream. When using moisturizers along with medications, the moisturizer should be applied about one hour after applying the medication to prevent diluting effect of the medication or moisturize around where you applied the medications. When not using medications, the moisturizer can be continued twice daily as maintenance.  Laundry and clothing: Avoid laundry products with added color or perfumes. Use unscented hypo-allergenic laundry products such as Tide free, Cheer free & gentle, and All free and clear.  If the skin still seems dry or sensitive, you can try double-rinsing the clothes. Avoid tight or scratchy clothing such as wool. Do not use fabric softeners or dyer sheets.

## 2021-03-20 NOTE — Assessment & Plan Note (Signed)
Past history - asthma like symptoms for 30+ years. Main triggers are exertion and panic attacks. Uses albuterol once a week with good benefit.  2022 spirometry showed restriction most likely due to body habitus. Interim history - cannot afford albuterol prescription and has been using Primatene Mist inhaler once a month with good benefit.  Today's spirometry showed some restriction.  Sample of Proair digihaler given and demonstrated proper use.  May use albuterol rescue inhaler 2 puffs every 4 to 6 hours as needed for shortness of breath, chest tightness, coughing, and wheezing. May use albuterol rescue inhaler 2 puffs 5 to 15 minutes prior to strenuous physical activities. Monitor frequency of use.

## 2021-03-20 NOTE — Assessment & Plan Note (Signed)
Past history - delayed reaction to shrimp as a child in the form of right eye swelling. Resolved without any intervention. Then had another episode of eye swelling as an adult but not sure of shellfish exposure. Tolerates finned fish with no issues.  2022 skin testing showed: Positive to shellfish, shrimp, crab, lobster, oyster.  Interim history - EpiPen was too expensive so he did not pick up prescription.  No reactions since last visit.  Continue strict avoidance of shellfish and mollusks  I have prescribed epinephrine injectable (Symjepi). For mild symptoms you can take over the counter antihistamines such as Benadryl and monitor symptoms closely. If symptoms worsen or if you have severe symptoms including breathing issues, throat closure, significant swelling, whole body hives, severe diarrhea and vomiting, lightheadedness then inject epinephrine and seek immediate medical care afterwards.  Let me know if the symjepi is too expensive.   Stressed importance of having an epinephrine device on hand.   Food action plan in place.

## 2021-03-20 NOTE — Progress Notes (Signed)
Follow Up Note  RE: Edward Carr MRN: [id number] DOB: 1976-07-20 Date of Office Visit: 03/20/2021  Referring provider: Dorena Dew, FNP Primary care provider: Dorena Dew, FNP  Chief Complaint: Follow-up  History of Present Illness: I had the pleasure of seeing Edward Carr for a follow up visit at the [name] on 03/20/2021. He is a 45 y.o. male, who is being followed for food allergy, allergic rhinoconjunctivitis, asthma, rash. His previous allergy office visit was on 11/14/2020 with Dr. [name]. Today is a regular follow up visit.  Food allergy Currently avoiding shellfish and no reactions. Patient could not afford Epipen as it was over $100.   Allergic rhino conjunctivitis Currently not taking any medications.  Some itchy eyes at times.   Asthma Noticed some shortness of breath when outdoors.  Unable to fill albuterol prescription and instead has been using and OTC inhaler - Primatene mists once a month with good benefit. Denies any ER/urgent care visits or prednisone use since the last visit.   Rash  Rash on the face is still the same. He has been scrubbing his face to see if helps - which did not.  Patient did not see dermatology yet for this.   Assessment and Plan: Edward Carr is a 45 y.o. male with: Other adverse food reactions, not elsewhere classified, subsequent encounter Past history - delayed reaction to shrimp as a child in the form of right eye swelling. Resolved without any intervention. Then had another episode of eye swelling as an adult but not sure of shellfish exposure. Tolerates finned fish with no issues.  2022 skin testing showed: Positive to shellfish, shrimp, crab, lobster, oyster.  Interim history - EpiPen was too expensive so he did not pick up prescription.  No reactions since last visit. Continue strict avoidance of shellfish and mollusks I have prescribed epinephrine injectable (Symjepi). For mild symptoms you can take  over the counter antihistamines such as Benadryl and monitor symptoms closely. If symptoms worsen or if you have severe symptoms including breathing issues, throat closure, significant swelling, whole body hives, severe diarrhea and vomiting, lightheadedness then inject epinephrine and seek immediate medical care afterwards. Let me know if the symjepi is too expensive.  Stressed importance of having an epinephrine device on hand.  Food action plan in place.  Seasonal and perennial allergic rhinoconjunctivitis Past history - some rhino conjunctivitis symptoms in the spring.  2022 skin testing showed: Positive to grass, trees, ragweed, weed pollen, dust mites, cockroach. Borderline to cat and dog. Question if the periorbital swelling in the HPI was due to environmental allergy exposure rather than food allergies given timeline of events. Interim history - some itchy eyes at times. Continue environmental control measures. Use Xyzal (levocetirizine) daily as needed. May take twice a day during allergy flares. May switch antihistamines every few months.  Asthma Past history - asthma like symptoms for 30+ years. Main triggers are exertion and panic attacks. Uses albuterol once a week with good benefit.  2022 spirometry showed restriction most likely due to body habitus. Interim history - cannot afford albuterol prescription and has been using Primatene Mist inhaler once a month with good benefit. Today's spirometry showed some restriction. Sample of Proair digihaler given and demonstrated proper use. May use albuterol rescue inhaler 2 puffs every 4 to 6 hours as needed for shortness of breath, chest tightness, coughing, and wheezing. May use albuterol rescue inhaler 2 puffs 5 to 15 minutes prior to strenuous physical activities. Monitor  frequency of use.   Rash and other nonspecific skin eruption Past history - Persistent facial rash even prior to the COVID-19 pandemic and mask wearing. No triggers  noted. No prior dermatology evaluation. Interim history - unchanged and has not seen dermatology yet. Not sure what's causing the rash.  See below for proper skin care.  Patient has appointment with his PCP in the upcoming week and advised him to ask for dermatology evaluation.  Essential hypertension Elevated blood pressure reading in the office.  Follow up with PCP.  Return in about 3 months (around 06/20/2021).  Meds ordered this encounter  Medications   EPINEPHrine (SYMJEPI) 0.3 MG/0.3ML SOSY    Sig: Inject 0.3 mLs (0.3 mg total) as directed as needed (severe allergic reaction).    Dispense:  1 each    Refill:  1    Patient bringing coupon card.   levocetirizine (XYZAL) 5 MG tablet    Sig: Take 1 tablet (5 mg total) by mouth every evening.    Dispense:  30 tablet    Refill:  5   Lab Orders  No laboratory test(s) ordered today    Diagnostics: Spirometry:  Tracings reviewed. His effort: Good reproducible efforts. FVC: 2.14L FEV1: 1.60L, 54% predicted FEV1/FVC ratio: 75% Interpretation: Spirometry consistent with possible restrictive disease.  Please see scanned spirometry results for details.  Medication List:  Current Outpatient Medications  Medication Sig Dispense Refill   amLODipine (NORVASC) 10 MG tablet Take 1 tablet (10 mg total) by mouth daily. 90 tablet 1   cloNIDine (CATAPRES) 0.1 MG tablet Take 0.5 tablets (0.05 mg total) by mouth at bedtime. X 2 weeks. Then 1/2 pill by mouth every other night x 2 weeks then d/c 30 tablet 0   EPINEPHrine (SYMJEPI) 0.3 MG/0.3ML SOSY Inject 0.3 mLs (0.3 mg total) as directed as needed (severe allergic reaction). 1 each 1   levocetirizine (XYZAL) 5 MG tablet Take 1 tablet (5 mg total) by mouth every evening. 30 tablet 5   lisinopril-hydrochlorothiazide (ZESTORETIC) 20-25 MG tablet Take 1 tablet by mouth daily. 90 tablet 1   metFORMIN (GLUCOPHAGE) 500 MG tablet Take 1 tablet (500 mg total) by mouth 2 (two) times daily with a meal.  180 tablet 0   albuterol (VENTOLIN HFA) 108 (90 Base) MCG/ACT inhaler Inhale 1 puff into the lungs every 6 (six) hours as needed for wheezing or shortness of breath. (Patient not taking: Reported on 03/20/2021) 8 g 1   cyclobenzaprine (FLEXERIL) 10 MG tablet Take 1 tablet (10 mg total) by mouth at bedtime. (Patient not taking: No sig reported) 5 tablet 0   meloxicam (MOBIC) 7.5 MG tablet Take 1 tablet (7.5 mg total) by mouth daily. (Patient not taking: Reported on 03/20/2021) 90 tablet 0   No current facility-administered medications for this visit.   Allergies: Allergies  Allergen Reactions   Crab Extract Allergy Skin Test    Shellfish Allergy    Shrimp Extract Allergy Skin Test    I reviewed his past medical history, social history, family history, and environmental history and no significant changes have been reported from his previous visit.  Review of Systems  Constitutional:  Negative for appetite change, chills, fever and unexpected weight change.  HENT:  Negative for congestion and rhinorrhea.   Eyes:  Positive for itching.  Respiratory:  Negative for cough, chest tightness, shortness of breath and wheezing.   Cardiovascular:  Negative for chest pain.  Gastrointestinal:  Negative for abdominal pain.  Genitourinary:  Negative for difficulty  urinating.  Skin:  Positive for rash.  Allergic/Immunologic: Positive for environmental allergies and food allergies.  Neurological:  Negative for headaches.   Objective: BP (!) 132/98   Pulse 92   Temp 97.9 F (36.6 C) (Temporal)   Resp 20   Ht '5\' 5"'$  (1.651 m)   Wt (!) 321 lb (145.6 kg)   SpO2 98%   BMI 53.42 kg/m  Body mass index is 53.42 kg/m. Physical Exam Vitals and nursing note reviewed.  Constitutional:      Appearance: Normal appearance. He is well-developed. He is obese.  HENT:     Head: Normocephalic and atraumatic.     Right Ear: Tympanic membrane and external ear normal.     Left Ear: Tympanic membrane and external  ear normal.     Nose: Nose normal.     Mouth/Throat:     Mouth: Mucous membranes are moist.     Pharynx: Oropharynx is clear.  Eyes:     Conjunctiva/sclera: Conjunctivae normal.  Cardiovascular:     Rate and Rhythm: Normal rate and regular rhythm.     Heart sounds: Normal heart sounds. No murmur heard.   No friction rub. No gallop.  Pulmonary:     Effort: Pulmonary effort is normal.     Breath sounds: Normal breath sounds. No wheezing, rhonchi or rales.  Musculoskeletal:     Cervical back: Neck supple.  Skin:    General: Skin is warm.     Findings: Rash present.     Comments: Erythematous hue on the face.  Neurological:     Mental Status: He is alert and oriented to person, place, and time.  Psychiatric:        Behavior: Behavior normal.  Previous notes and tests were reviewed. The plan was reviewed with the patient/family, and all questions/concerned were addressed.  It was my pleasure to see Edward Carr today and participate in his care. Please feel free to contact me with any questions or concerns.  Sincerely,  Rexene Alberts, DO Allergy & Immunology  [facility] office: [place] office: [phone]

## 2021-03-20 NOTE — Assessment & Plan Note (Signed)
Past history - some rhino conjunctivitis symptoms in the spring.  2022 skin testing showed: Positive to grass, trees, ragweed, weed pollen, dust mites, cockroach. Borderline to cat and dog. Question if the periorbital swelling in the HPI was due to environmental allergy exposure rather than food allergies given timeline of events. Interim history - some itchy eyes at times.  Continue environmental control measures.  Use Xyzal (levocetirizine) daily as needed. May take twice a day during allergy flares. May switch antihistamines every few months.

## 2021-03-21 ENCOUNTER — Ambulatory Visit: Payer: Self-pay | Admitting: Family Medicine

## 2021-03-28 ENCOUNTER — Other Ambulatory Visit: Payer: Self-pay

## 2021-03-28 ENCOUNTER — Ambulatory Visit (INDEPENDENT_AMBULATORY_CARE_PROVIDER_SITE_OTHER): Payer: PRIVATE HEALTH INSURANCE | Admitting: Family Medicine

## 2021-03-28 ENCOUNTER — Encounter: Payer: Self-pay | Admitting: Family Medicine

## 2021-03-28 VITALS — BP 133/92 | HR 83 | Temp 97.6°F | Ht 65.0 in | Wt 321.8 lb

## 2021-03-28 DIAGNOSIS — E119 Type 2 diabetes mellitus without complications: Secondary | ICD-10-CM

## 2021-03-28 DIAGNOSIS — Z6841 Body Mass Index (BMI) 40.0 and over, adult: Secondary | ICD-10-CM

## 2021-03-28 DIAGNOSIS — I10 Essential (primary) hypertension: Secondary | ICD-10-CM

## 2021-03-28 LAB — POCT GLYCOSYLATED HEMOGLOBIN (HGB A1C)
HbA1c POC (<> result, manual entry): 6.3 %
HbA1c, POC (controlled diabetic range): 6.3 % (ref 0.0–7.0)
HbA1c, POC (prediabetic range): 6.3 % (ref 5.7–6.4)
Hemoglobin A1C: 6.3 % — AB (ref 4.0–5.6)

## 2021-03-28 LAB — POCT URINALYSIS DIP (CLINITEK)
Bilirubin, UA: NEGATIVE
Blood, UA: NEGATIVE
Glucose, UA: NEGATIVE mg/dL
Ketones, POC UA: NEGATIVE mg/dL
Leukocytes, UA: NEGATIVE
Nitrite, UA: NEGATIVE
POC PROTEIN,UA: NEGATIVE
Spec Grav, UA: >=1.03 — AB (ref 1.010–1.025)
Urobilinogen, UA: 0.2 E.U./dL
pH, UA: 5.5 (ref 5.0–8.0)

## 2021-03-28 LAB — GLUCOSE, POCT (MANUAL RESULT ENTRY): POC Glucose: 111 mg/dl — AB (ref 70–99)

## 2021-03-28 NOTE — Progress Notes (Signed)
Patient [facility]   Established Patient Office Visit  Subjective:  Patient ID: Edward Carr, male    DOB: 1976/06/08  Age: 45 y.o. MRN: [id number]  CC:  Chief Complaint  Patient presents with   Follow-up    Pt is here today for his diabetes follow up visit. Pt has no other concerns or issues to discuss today.    HPI Edward Carr is a 45 year old male with a medical history significant for hypertension, morbid obesity, hyperlipidemia, and type 2 DM presents for a follow up of chronic conditions. Patient says that he is doing well and is without complaint.  Diabetes He presents for his follow-up diabetic visit. He has type 2 diabetes mellitus. His disease course has been stable. Pertinent negatives for diabetes include no blurred vision, no chest pain, no foot paresthesias, no foot ulcerations, no polydipsia, no polyphagia, no polyuria, no visual change, no weakness and no weight loss. Risk factors for coronary artery disease include obesity, diabetes mellitus and sedentary lifestyle. He is compliant with treatment most of the time. When asked about meal planning, he reported none. He rarely participates in exercise. He does not see a podiatrist.Eye exam is not current.  Hypertension This is a chronic problem. The problem is controlled. Pertinent negatives include no blurred vision, chest pain, neck pain, orthopnea or palpitations. Risk factors for coronary artery disease include male gender, obesity, sedentary lifestyle, dyslipidemia and diabetes mellitus. Compliance problems include diet and exercise.  There is no history of kidney disease or CAD/MI.  Hyperlipidemia This is a chronic problem. The problem is controlled. Factors aggravating his hyperlipidemia include fatty foods. Pertinent negatives include no chest pain. Compliance problems include adherence to diet and adherence to exercise.     Past Medical History:  Diagnosis Date   Asthma     Hypertension     Past Surgical History:  Procedure Laterality Date   DENTAL SURGERY      Family History  Problem Relation Age of Onset   Diabetes Mother    Cancer Father    Cancer Sister    Asthma Sister     Social History   Socioeconomic History   Marital status: Single    Spouse name: Not on file   Number of children: Not on file   Years of education: Not on file   Highest education level: Not on file  Occupational History   Not on file  Tobacco Use   Smoking status: Never   Smokeless tobacco: Never  Vaping Use   Vaping Use: Never used  Substance and Sexual Activity   Alcohol use: No   Drug use: No   Sexual activity: Never  Other Topics Concern   Not on file  Social History Narrative   Not on file   Social Determinants of Health   Financial Resource Strain: Not on file  Food Insecurity: Not on file  Transportation Needs: Not on file  Physical Activity: Not on file  Stress: Not on file  Social Connections: Not on file  Intimate Partner Violence: Not on file    Outpatient Medications Prior to Visit  Medication Sig Dispense Refill   amLODipine (NORVASC) 10 MG tablet Take 1 tablet (10 mg total) by mouth daily. 90 tablet 1   lisinopril-hydrochlorothiazide (ZESTORETIC) 20-25 MG tablet Take 1 tablet by mouth daily. 90 tablet 1   metFORMIN (GLUCOPHAGE) 500 MG tablet Take 1 tablet (500 mg total) by mouth 2 (two) times daily with  a meal. 180 tablet 0   albuterol (VENTOLIN HFA) 108 (90 Base) MCG/ACT inhaler Inhale 1 puff into the lungs every 6 (six) hours as needed for wheezing or shortness of breath. (Patient not taking: No sig reported) 8 g 1   cloNIDine (CATAPRES) 0.1 MG tablet Take 0.5 tablets (0.05 mg total) by mouth at bedtime. X 2 weeks. Then 1/2 pill by mouth every other night x 2 weeks then d/c (Patient not taking: Reported on 03/28/2021) 30 tablet 0   cyclobenzaprine (FLEXERIL) 10 MG tablet Take 1 tablet (10 mg total) by mouth at bedtime. (Patient not taking:  No sig reported) 5 tablet 0   EPINEPHrine (SYMJEPI) 0.3 MG/0.3ML SOSY Inject 0.3 mLs (0.3 mg total) as directed as needed (severe allergic reaction). (Patient not taking: Reported on 03/28/2021) 1 each 1   levocetirizine (XYZAL) 5 MG tablet Take 1 tablet (5 mg total) by mouth every evening. (Patient not taking: Reported on 03/28/2021) 30 tablet 5   meloxicam (MOBIC) 7.5 MG tablet Take 1 tablet (7.5 mg total) by mouth daily. (Patient not taking: No sig reported) 90 tablet 0   No facility-administered medications prior to visit.    Allergies  Allergen Reactions   Crab Extract Allergy Skin Test    Shellfish Allergy    Shrimp Extract Allergy Skin Test     ROS Review of Systems  Constitutional: Negative.  Negative for weight loss.  HENT: Negative.    Eyes: Negative.  Negative for blurred vision.  Respiratory: Negative.    Cardiovascular: Negative.  Negative for chest pain, palpitations and orthopnea.  Gastrointestinal: Negative.   Endocrine: Negative for polydipsia, polyphagia and polyuria.  Genitourinary: Negative.   Musculoskeletal: Negative.  Negative for neck pain.  Skin: Negative.   Neurological:  Negative for weakness.     Objective:    Physical Exam Constitutional:      Appearance: Normal appearance. He is obese.  Eyes:     Pupils: Pupils are equal, round, and reactive to light.  Cardiovascular:     Rate and Rhythm: Normal rate.     Pulses: Normal pulses.  Pulmonary:     Effort: Pulmonary effort is normal.  Abdominal:     General: Bowel sounds are normal.  Musculoskeletal:        General: Normal range of motion.  Skin:    General: Skin is warm.  Neurological:     General: No focal deficit present.     Mental Status: He is alert. Mental status is at baseline.  Psychiatric:        Mood and Affect: Mood normal.        Behavior: Behavior normal.        Thought Content: Thought content normal.        Judgment: Judgment normal.    BP (!) 133/92   Pulse 83   Temp  97.6 F (36.4 C)   Ht '5\' 5"'$  (1.651 m)   Wt (!) 321 lb 12.8 oz (146 kg)   SpO2 98%   BMI 53.55 kg/m  Wt Readings from Last 3 Encounters:  03/28/21 (!) 321 lb 12.8 oz (146 kg)  03/20/21 (!) 321 lb (145.6 kg)  01/16/21 (!) 320 lb 14.4 oz (145.6 kg)     Health Maintenance Due  Topic Date Due   OPHTHALMOLOGY EXAM  Never done    There are no preventive care reminders to display for this patient.  Lab Results  Component Value Date   TSH 1.430 12/11/2019   Lab Results  Component Value Date   WBC 10.3 12/11/2019   HGB 13.7 12/11/2019   HCT 40.7 12/11/2019   MCV 82 12/11/2019   PLT 395 12/11/2019   Lab Results  Component Value Date   NA 138 09/13/2020   K 4.2 09/13/2020   CO2 22 09/13/2020   GLUCOSE 99 09/13/2020   BUN 14 09/13/2020   CREATININE 1.23 09/13/2020   BILITOT 0.5 09/13/2020   ALKPHOS 80 09/13/2020   AST 17 09/13/2020   ALT 19 09/13/2020   PROT 7.8 09/13/2020   ALBUMIN 4.6 09/13/2020   CALCIUM 10.1 09/13/2020   Lab Results  Component Value Date   CHOL 149 04/15/2019   Lab Results  Component Value Date   HDL 31 (L) 04/15/2019   Lab Results  Component Value Date   LDLCALC 92 04/15/2019   Lab Results  Component Value Date   TRIG 146 04/15/2019   Lab Results  Component Value Date   CHOLHDL 4.8 04/15/2019   Lab Results  Component Value Date   HGBA1C 6.1 (A) 12/13/2020   HGBA1C 6.1 12/13/2020   HGBA1C 6.1 12/13/2020   HGBA1C 6.1 12/13/2020      Assessment & Plan:   Problem List Items Addressed This Visit   None Visit Diagnoses     Type 2 diabetes mellitus without complication, without long-term current use of insulin (HCC)    -  Primary   Relevant Orders   POCT URINALYSIS DIP (CLINITEK)   Glucose (CBG) (Completed)   HgB A1c      1. Type 2 diabetes mellitus without complication, without long-term current use of insulin (HCC) Hemoglobin a1C is stable at 6.3 and is consistent with goal of <7.  No medication changes warranted at  this time.  Recommend a carbohydrate modified diet as discussed.  Patient continues to follow up with diabetes and nutrition services - POCT URINALYSIS DIP (CLINITEK) - Glucose (CBG) - HgB A1c  2. Essential hypertension BP (!) 133/92   Pulse 83   Temp 97.6 F (36.4 C)   Ht '5\' 5"'$  (1.651 m)   Wt (!) 321 lb 12.8 oz (146 kg)   SpO2 98%   BMI 53.55 kg/m  - Continue medication, monitor blood pressure at home. Continue DASH diet.  Reminder to go to the ER if any CP, SOB, nausea, dizziness, severe HA, changes vision/speech, left arm numbness and tingling and jaw pain.   - Lipid Panel - Comprehensive metabolic panel  3. Morbid obesity with BMI of 50.0-59.9, adult [facility]) The patient is asked to make an attempt to improve diet and exercise patterns to aid in medical management of this problem.  Follow-up: 3 months for chronic conditions    Jackson  APRN, MSN, FNP-C Patient [facility] [address] [place], [place] [address] [phone]

## 2021-03-28 NOTE — Patient Instructions (Signed)
Diabetes Mellitus Action Plan °Following a diabetes action plan is a way for you to manage your diabetes (diabetes mellitus) symptoms. The plan is color-coded to help you understand what actions you need to take based on any symptoms you are having. °If you have symptoms in the red zone, you need medical care right away. °If you have symptoms in the yellow zone, you are having problems. °If you have symptoms in the green zone, you are doing well. °Learning about and understanding diabetes can take time. Follow the plan that you develop with your health care provider. Know the target range for your blood sugar (glucose) level, and review your treatment plan with your health care provider at each visit. °The target range for my blood sugar level is __________________________ mg/dL. °Red zone °Get medical help right away if you have any of the following symptoms: °A blood sugar test result that is below 54 mg/dL (3 mmol/L). °A blood sugar test result that is at or above 240 mg/dL (13.3 mmol/L) for 2 days in a row. °Confusion or trouble thinking clearly. °Difficulty breathing. °Sickness or a fever for 2 or more days that is not getting better. °Moderate or large ketone levels in your urine. °Feeling tired or having no energy. °If you have any red zone symptoms, do not wait to see if the symptoms will go away. Get medical help right away. Call your local emergency services (911 in the [place] Do not drive yourself to the hospital. °If you have severely low blood sugar (severe hypoglycemia) and you cannot eat or drink, you may need glucagon. Make sure a family member or close friend knows how to check your blood sugar and how to give you glucagon. You may need to be treated in a hospital for this condition. °Yellow zone °If you have any of the following symptoms, your diabetes is not under control and you may need to make some changes: °A blood sugar test result that is at or above 240 mg/dL (13.3 mmol/L) for 2 days in a  row. °Blood sugar test results that are below 70 mg/dL (3.9 mmol/L). °Other symptoms of hypoglycemia, such as: °Shaking or feeling light-headed. °Confusion or irritability. °Feeling hungry. °Having a fast heartbeat. °If you have any yellow zone symptoms: °Treat your hypoglycemia by eating or drinking 15 grams of a rapid-acting carbohydrate. Follow the 15:15 rule: °Take 15 grams of a rapid-acting carbohydrate, such as: °1 tube of glucose gel. °4 glucose pills. °4 oz (120 mL) of fruit juice. °4 oz (120 mL) of regular (not diet) soda. °Check your blood sugar 15 minutes after you take the carbohydrate. °If the repeat blood sugar test is still at or below 70 mg/dL (3.9 mmol/L), take 15 grams of a carbohydrate again. °If your blood sugar does not increase above 70 mg/dL (3.9 mmol/L) after 3 tries, get medical help right away. °After your blood sugar returns to normal, eat a meal or a snack within 1 hour. °Keep taking your daily medicines as told by your health care provider. °Check your blood sugar more often than you normally would. °Write down your results. °Call your health care provider if you have trouble keeping your blood sugar in your target range. ° °Green zone °These signs mean you are doing well and you can continue what you are doing to manage your diabetes: °Your blood sugar is within your personal target range. For most people, a blood sugar level before a meal (preprandial) should be 80-130 mg/dL (4.4-7.2 mmol/L). °You feel   well, and you are able to do daily activities. °If you are in the green zone, continue to manage your diabetes as told by your health care provider. To do this: °Eat a healthy diet. °Exercise regularly. °Check your blood sugar as told by your health care provider. °Take your medicines as told by your health care provider. ° °Where to find more information °American Diabetes Association (ADA): diabetes.org °Association of Diabetes Care & Education Specialists (ADCES):  diabeteseducator.org °Summary °Following a diabetes action plan is a way for you to manage your diabetes symptoms. The plan is color-coded to help you understand what actions you need to take based on any symptoms you are having. °Follow the plan that you develop with your health care provider. Make sure you know your personal target blood sugar level. °Review your treatment plan with your health care provider at each visit. °This information is not intended to replace advice given to you by your health care provider. Make sure you discuss any questions you have with your health care provider. °Document Revised: 01/21/2020 Document Reviewed: 01/21/2020 °Elsevier Patient Education © 2022 Elsevier Inc. ° °

## 2021-03-29 LAB — LIPID PANEL
Chol/HDL Ratio: 4.4 ratio (ref 0.0–5.0)
Cholesterol, Total: 181 mg/dL (ref 100–199)
HDL: 41 mg/dL (ref >39)
LDL Chol Calc (NIH): 123 mg/dL — ABNORMAL HIGH (ref 0–99)
Triglycerides: 93 mg/dL (ref 0–149)
VLDL Cholesterol Cal: 17 mg/dL (ref 5–40)

## 2021-03-29 LAB — COMPREHENSIVE METABOLIC PANEL
ALT: 18 IU/L (ref 0–44)
AST: 17 IU/L (ref 0–40)
Albumin/Globulin Ratio: 1.7 (ref 1.2–2.2)
Albumin: 4.5 g/dL (ref 4.0–5.0)
Alkaline Phosphatase: 66 IU/L (ref 44–121)
BUN/Creatinine Ratio: 16 (ref 9–20)
BUN: 18 mg/dL (ref 6–24)
Bilirubin Total: 0.4 mg/dL (ref 0.0–1.2)
CO2: 25 mmol/L (ref 20–29)
Calcium: 10.1 mg/dL (ref 8.7–10.2)
Chloride: 100 mmol/L (ref 96–106)
Creatinine, Ser: 1.13 mg/dL (ref 0.76–1.27)
Globulin, Total: 2.7 g/dL (ref 1.5–4.5)
Glucose: 100 mg/dL — ABNORMAL HIGH (ref 65–99)
Potassium: 4.3 mmol/L (ref 3.5–5.2)
Sodium: 139 mmol/L (ref 134–144)
Total Protein: 7.2 g/dL (ref 6.0–8.5)
eGFR: 83 mL/min/{1.73_m2} (ref >59)

## 2021-03-30 ENCOUNTER — Telehealth: Payer: Self-pay | Admitting: Family Medicine

## 2021-03-30 DIAGNOSIS — E785 Hyperlipidemia, unspecified: Secondary | ICD-10-CM

## 2021-03-30 MED ORDER — ATORVASTATIN CALCIUM 20 MG PO TABS: 20.0000 mg | ORAL_TABLET | Freq: Every day | ORAL | 3 refills | Status: DC

## 2021-03-30 NOTE — Telephone Encounter (Signed)
Edward Carr is a 45 year old male with a medical history significant for type 2 diabetes mellitus, obesity, and hypertension that was evaluated in office on 03/28/2021.  Reviewed lipid panel, LDL cholesterol elevated at 123, goal is less than 100.  We will start atorvastatin 20 mg daily with dinner.  Recommend a low-fat, low carbohydrate diet divided over small meals throughout the day.  We will follow-up in 3 months as scheduled.  [name]  APRN, MSN, FNP-C Patient [facility] [address] [place], [place] [address] [phone]

## 2021-03-30 NOTE — Telephone Encounter (Signed)
Patient notified of results, verbally understood. No questions or concerns.

## 2021-04-20 ENCOUNTER — Encounter: Payer: PRIVATE HEALTH INSURANCE | Attending: Family Medicine | Admitting: Dietician

## 2021-04-20 ENCOUNTER — Other Ambulatory Visit: Payer: Self-pay

## 2021-04-20 ENCOUNTER — Encounter: Payer: Self-pay | Admitting: Dietician

## 2021-04-20 DIAGNOSIS — E119 Type 2 diabetes mellitus without complications: Secondary | ICD-10-CM | POA: Insufficient documentation

## 2021-04-20 NOTE — Progress Notes (Signed)
Diabetes Self-Management Education  Visit Type: Follow-up  Appt. Start Time: 1445 Appt. End Time: 8841  04/20/2021  [name], identified by name and date of birth, is a 45 y.o. male with a diagnosis of Diabetes:  .   ASSESSMENT  A1c is up to 6.3 from 6.1 3 months ago  Pt states they are experimenting with new foods for themselves and their mother. Pt reports their mother is trying to change to a healthier diet as well. Pt is trying new snacks like cheese cubes and crackers. Pt does not keep fruit drinks in the house anymore, may only have orange juice occasionally. Pt reports missing lunch 1-2 times a week. Pt is eating cereals, sweets for desserts. Pt states they are going to abstain from candy this Halloween. Pt reports being mostly pain free in their knee. Pt can walk without their cane, and walks around more now than last year. Pt has been taking breaks while doing art work, stretching, walking around their house.  Height 5\' 5"  (1.651 m), weight (!) 322 lb 8 oz (146.3 kg). Body mass index is 53.67 kg/m.   Diabetes Self-Management Education - 04/20/21 1448       Visit Information   Visit Type Follow-up      Dietary Intake   Breakfast Sausage and bacon bagel, 1 cup orange juice    Snack (morning) Wheat Thins crackers    Lunch none    Snack (afternoon) Wheat Thins    Dinner Steak, baked potato, broccoli and cheese, diet soda    Beverage(s) OJ, diet soda,      Exercise   Exercise Type ADL's    How many days per week to you exercise? 0    How many minutes per day do you exercise? 0    Total minutes per week of exercise 0      Individualized Goals (developed by patient)   Nutrition General guidelines for healthy choices and portions discussed    Physical Activity Exercise 1-2 times per week    Medications take my medication as prescribed    Monitoring  Not Applicable      Patient Self-Evaluation of Goals - Patient rates self as meeting previously set goals (% of  time)   Nutrition < 25%    Physical Activity < 25%    Medications >75%    Monitoring Not Applicable    Problem Solving < 25%    Reducing Risk < 25%    Health Coping < 25%      Outcomes   Expected Outcomes Demonstrated limited interest in learning.  Expect minimal changes    Future DMSE 3-4 months    Program Status Not Completed      Subsequent Visit   Since your last visit have you continued or begun to take your medications as prescribed? Yes    Since your last visit have you had your blood pressure checked? Yes    Is your most recent blood pressure lower, unchanged, or higher since your last visit? Unchanged    Since your last visit have you experienced any weight changes? Gain    Weight Gain (lbs) 2    Since your last visit, are you checking your blood glucose at least once a day? N/A             Individualized Plan for Diabetes Self-Management Training:   Learning Objective:  Patient will have a greater understanding of diabetes self-management. Patient education plan is to attend individual and/or group  sessions per assessed needs and concerns.   Plan:   Patient Instructions  Work with your mother to work together on making dietary changes. This can be a great partnership to motivate each other.  Make it a point to try air frying your chicken!  Move to a 2%, or 1% milk instead of whole milk. Measure out 1 cup of cereal if you have it for dessert.  Choose a day on the weekends to do some physical activities on your patio for 20-30 minutes.  Choose a small bowl to have your snacks in when doing your artwork. This can help to minimize our calories we take in while working during the day.  Expected Outcomes:  Demonstrated limited interest in learning.  Expect minimal changes  If problems or questions, patient to contact team via:  Phone and Email  Future DSME appointment: 3-4 months

## 2021-04-20 NOTE — Patient Instructions (Addendum)
Work with your mother to work together on Arboriculturist dietary changes. This can be a great partnership to motivate each other.  Make it a point to try air frying your chicken!  Move to a 2%, or 1% milk instead of whole milk. Measure out 1 cup of cereal if you have it for dessert.  Choose a day on the weekends to do some physical activities on your patio for 20-30 minutes.  Choose a small bowl to have your snacks in when doing your artwork. This can help to minimize our calories we take in while working during the day.

## 2021-06-19 ENCOUNTER — Ambulatory Visit: Payer: No Typology Code available for payment source | Admitting: Allergy

## 2021-07-04 ENCOUNTER — Encounter: Payer: Self-pay | Admitting: Family Medicine

## 2021-07-04 ENCOUNTER — Other Ambulatory Visit: Payer: Self-pay

## 2021-07-04 ENCOUNTER — Ambulatory Visit (INDEPENDENT_AMBULATORY_CARE_PROVIDER_SITE_OTHER): Payer: PRIVATE HEALTH INSURANCE | Admitting: Family Medicine

## 2021-07-04 VITALS — BP 132/86 | HR 86 | Temp 98.2°F | Ht 65.0 in | Wt 325.0 lb

## 2021-07-04 DIAGNOSIS — I10 Essential (primary) hypertension: Secondary | ICD-10-CM

## 2021-07-04 DIAGNOSIS — E119 Type 2 diabetes mellitus without complications: Secondary | ICD-10-CM

## 2021-07-04 DIAGNOSIS — Z6841 Body Mass Index (BMI) 40.0 and over, adult: Secondary | ICD-10-CM

## 2021-07-04 DIAGNOSIS — E785 Hyperlipidemia, unspecified: Secondary | ICD-10-CM

## 2021-07-04 LAB — POCT URINALYSIS DIP (CLINITEK)
Bilirubin, UA: NEGATIVE
Blood, UA: NEGATIVE
Glucose, UA: NEGATIVE mg/dL
Ketones, POC UA: NEGATIVE mg/dL
Leukocytes, UA: NEGATIVE
Nitrite, UA: NEGATIVE
POC PROTEIN,UA: NEGATIVE
Spec Grav, UA: 1.025 (ref 1.010–1.025)
Urobilinogen, UA: 0.2 E.U./dL
pH, UA: 5.5 (ref 5.0–8.0)

## 2021-07-04 LAB — POCT GLYCOSYLATED HEMOGLOBIN (HGB A1C)
HbA1c POC (<> result, manual entry): 6.5 % (ref 4.0–5.6)
HbA1c, POC (controlled diabetic range): 6.5 % (ref 0.0–7.0)
HbA1c, POC (prediabetic range): 6.5 % — AB (ref 5.7–6.4)
Hemoglobin A1C: 6.5 % — AB (ref 4.0–5.6)

## 2021-07-04 LAB — GLUCOSE, POCT (MANUAL RESULT ENTRY): POC Glucose: 123 mg/dl — AB (ref 70–99)

## 2021-07-04 MED ORDER — METFORMIN HCL 500 MG PO TABS: 500.0000 mg | ORAL_TABLET | Freq: Two times a day (BID) | ORAL | 2 refills | Status: DC

## 2021-07-04 NOTE — Progress Notes (Signed)
Patient [facility]  Established Patient Office Visit  Subjective:  Patient ID: Edward Carr, male    DOB: [birth year]  Age: 45 y.o. MRN: [id number]  CC:  Chief Complaint  Patient presents with   Follow-up    3 month follow up,     HPI Diabetes He presents for his follow-up diabetic visit. He has type 2 diabetes mellitus. His disease course has been stable. Pertinent negatives for diabetes include no blurred vision, no polydipsia, no polyphagia, no polyuria and no visual change. Symptoms are stable. Risk factors for coronary artery disease include obesity, hypertension and diabetes mellitus. He is compliant with treatment all of the time.  Hypertension This is a chronic problem. Pertinent negatives include no blurred vision, orthopnea, palpitations, peripheral edema or shortness of breath. Compliance problems include diet and exercise.  There is no history of kidney disease.    Past Medical History:  Diagnosis Date   Asthma    Hypertension     Past Surgical History:  Procedure Laterality Date   DENTAL SURGERY      Family History  Problem Relation Age of Onset   Diabetes Mother    Cancer Father    Cancer Sister    Asthma Sister     Social History   Socioeconomic History   Marital status: Single    Spouse name: Not on file   Number of children: Not on file   Years of education: Not on file   Highest education level: Not on file  Occupational History   Not on file  Tobacco Use   Smoking status: Never   Smokeless tobacco: Never  Vaping Use   Vaping Use: Never used  Substance and Sexual Activity   Alcohol use: No   Drug use: No   Sexual activity: Never  Other Topics Concern   Not on file  Social History Narrative   Not on file   Social Determinants of Health   Financial Resource Strain: Not on file  Food Insecurity: Not on file  Transportation Needs: Not on file  Physical Activity: Not on file  Stress: Not on  file  Social Connections: Not on file  Intimate Partner Violence: Not on file    Outpatient Medications Prior to Visit  Medication Sig Dispense Refill   amLODipine (NORVASC) 10 MG tablet Take 1 tablet (10 mg total) by mouth daily. 90 tablet 1   atorvastatin (LIPITOR) 20 MG tablet Take 1 tablet (20 mg total) by mouth daily. 90 tablet 3   cloNIDine (CATAPRES) 0.1 MG tablet Take 0.5 tablets (0.05 mg total) by mouth at bedtime. X 2 weeks. Then 1/2 pill by mouth every other night x 2 weeks then d/c 30 tablet 0   lisinopril-hydrochlorothiazide (ZESTORETIC) 20-25 MG tablet Take 1 tablet by mouth daily. 90 tablet 1   metFORMIN (GLUCOPHAGE) 500 MG tablet Take 1 tablet (500 mg total) by mouth 2 (two) times daily with a meal. 180 tablet 0   albuterol (VENTOLIN HFA) 108 (90 Base) MCG/ACT inhaler Inhale 1 puff into the lungs every 6 (six) hours as needed for wheezing or shortness of breath. (Patient not taking: Reported on 03/20/2021) 8 g 1   cyclobenzaprine (FLEXERIL) 10 MG tablet Take 1 tablet (10 mg total) by mouth at bedtime. (Patient not taking: Reported on 02/23/2020) 5 tablet 0   EPINEPHrine (SYMJEPI) 0.3 MG/0.3ML SOSY Inject 0.3 mLs (0.3 mg total) as directed as needed (severe allergic reaction). (Patient not taking: Reported on  03/28/2021) 1 each 1   levocetirizine (XYZAL) 5 MG tablet Take 1 tablet (5 mg total) by mouth every evening. (Patient not taking: Reported on 03/28/2021) 30 tablet 5   meloxicam (MOBIC) 7.5 MG tablet Take 1 tablet (7.5 mg total) by mouth daily. (Patient not taking: Reported on 03/20/2021) 90 tablet 0   No facility-administered medications prior to visit.    Allergies  Allergen Reactions   Crab Extract Allergy Skin Test    Shellfish Allergy    Shrimp Extract Allergy Skin Test     ROS Review of Systems  Eyes:  Negative for blurred vision.  Respiratory:  Negative for shortness of breath.   Cardiovascular:  Negative for palpitations and orthopnea.  Endocrine: Negative for  polydipsia, polyphagia and polyuria.     Objective:    Physical Exam Constitutional:      Appearance: He is obese.  Eyes:     Pupils: Pupils are equal, round, and reactive to light.  Cardiovascular:     Rate and Rhythm: Normal rate and regular rhythm.  Pulmonary:     Effort: Pulmonary effort is normal.  Abdominal:     General: Bowel sounds are normal.  Musculoskeletal:        General: Normal range of motion.  Skin:    Findings: Rash (Erythematous rash to face) present.  Neurological:     General: No focal deficit present.     Mental Status: Mental status is at baseline.  Psychiatric:        Mood and Affect: Mood normal.        Thought Content: Thought content normal.        Judgment: Judgment normal.    BP (!) 142/95 (BP Location: Right Arm, Patient Position: Sitting)   Pulse 91   Temp 98.2 F (36.8 C)   Ht _0  (1.651 m)   Wt (!) 325 lb 0.2 oz (147.4 kg)   SpO2 93%   BMI 54.08 kg/m  Wt Readings from Last 3 Encounters:  07/04/21 (!) 325 lb 0.2 oz (147.4 kg)  04/20/21 (!) 322 lb 8 oz (146.3 kg)  03/28/21 (!) 321 lb 12.8 oz (146 kg)     Health Maintenance Due  Topic Date Due   COVID-19 Vaccine (4 - Booster) 01/09/2020    There are no preventive care reminders to display for this patient.  Lab Results  Component Value Date   TSH 1.430 12/11/2019   Lab Results  Component Value Date   WBC 10.3 12/11/2019   HGB 13.7 12/11/2019   HCT 40.7 12/11/2019   MCV 82 12/11/2019   PLT 395 12/11/2019   Lab Results  Component Value Date   NA 139 03/28/2021   K 4.3 03/28/2021   CO2 25 03/28/2021   GLUCOSE 100 (H) 03/28/2021   BUN 18 03/28/2021   CREATININE 1.13 03/28/2021   BILITOT 0.4 03/28/2021   ALKPHOS 66 03/28/2021   AST 17 03/28/2021   ALT 18 03/28/2021   PROT 7.2 03/28/2021   ALBUMIN 4.5 03/28/2021   CALCIUM 10.1 03/28/2021   EGFR 83 03/28/2021   Lab Results  Component Value Date   CHOL 181 03/28/2021   Lab Results  Component Value Date   HDL  41 03/28/2021   Lab Results  Component Value Date   LDLCALC 123 (H) 03/28/2021   Lab Results  Component Value Date   TRIG 93 03/28/2021   Lab Results  Component Value Date   CHOLHDL 4.4 03/28/2021   Lab Results  Component  Value Date   HGBA1C 6.5 (A) 07/04/2021   HGBA1C 6.5 07/04/2021   HGBA1C 6.5 (A) 07/04/2021   HGBA1C 6.5 07/04/2021      Assessment & Plan:   Problem List Items Addressed This Visit       Cardiovascular and Mediastinum   Essential hypertension   Relevant Orders   POCT URINALYSIS DIP (CLINITEK) (Completed)   Other Visit Diagnoses     Type 2 diabetes mellitus without complication, without long-term current use of insulin ([place])    -  Primary   Relevant Medications   metFORMIN (GLUCOPHAGE) 500 MG tablet   Other Relevant Orders   HgB A1c (Completed)   Glucose (CBG) (Completed)   POCT URINALYSIS DIP (CLINITEK) (Completed)       Meds ordered this encounter  Medications   metFORMIN (GLUCOPHAGE) 500 MG tablet    Sig: Take 1 tablet (500 mg total) by mouth 2 (two) times daily with a meal.    Dispense:  180 tablet    Refill:  2    Order Specific Question:   Supervising Provider    Answer:   Tresa Garter [[id number]]   6. Type 2 diabetes mellitus without complication, without long-term current use of insulin (HCC) Hemoglobin A1c is 6.5, which is at goal.  Patient advised to follow a low-fat, low carbohydrate diet.  He has been referred to diabetes and nutrition.  He states that he has been given materials for meal planning, but has not started as of yet. - HgB A1c - Glucose (CBG) - POCT URINALYSIS DIP (CLINITEK) - metFORMIN (GLUCOPHAGE) 500 MG tablet; Take 1 tablet (500 mg total) by mouth 2 (two) times daily with a meal.  Dispense: 180 tablet; Refill: 2  2. Essential hypertension - Continue medication, monitor blood pressure at home. Continue DASH diet.  Reminder to go to the ER if any CP, SOB, nausea, dizziness, severe HA, changes  vision/speech, left arm numbness and tingling and jaw pain.  BP 132/86   Pulse 86   Temp 98.2 F (36.8 C)   Ht _0  (1.651 m)   Wt (!) 325 lb 0.2 oz (147.4 kg)   SpO2 93%   BMI 54.08 kg/m   - POCT URINALYSIS DIP (CLINITEK)  3. Morbid obesity with BMI of 50.0-59.9, adult [facility]) The patient is asked to make an attempt to improve diet and exercise patterns to aid in medical management of this problem.   4. Hyperlipidemia LDL goal <100 The 10-year ASCVD risk score (Arnett DK, et al., 2019) is: 11.7%   Values used to calculate the score:     Age: 70 years     Sex: Male     Is Non-Hispanic African American: Yes     Diabetic: Yes     Tobacco smoker: No     Systolic Blood Pressure: 195 mmHg     Is BP treated: Yes     HDL Cholesterol: 41 mg/dL     Total Cholesterol: 181 mg/dL   Follow-up: Return in about 3 months (around 10/02/2021) for obesity, diabetes, hypertension, hyperlipidemia.    [name]  APRN, MSN, FNP-C [facility] [address] [place], [place] [address] [phone]

## 2021-07-04 NOTE — Patient Instructions (Signed)
The goal is to reduce weight by 10% over the next 3 months, we will aim for around 13 pounds.  Recommend a carbohydrate modified diet divided over small meals throughout the day.  Also, increase daily physical activity.  Brisk walk daily.  We will continue metformin 500 mg twice daily once with breakfast and second dose with dinner.  No medication changes warranted on today.

## 2021-07-30 NOTE — Progress Notes (Signed)
Follow Up Note  RE: Edward Carr MRN: [id number] DOB: 07/26/1976 Date of Office Visit: 07/31/2021  Referring provider: Dorena Dew, FNP Primary care provider: Dorena Dew, FNP  Chief Complaint: Allergies (Doing pretty good. Need script for Epi)  History of Present Illness: I had the pleasure of seeing [name] for a follow up visit at the [facility] on 07/31/2021. He is a 46 y.o. male, who is being followed for food allergy, allergic rhinoconjunctivitis, asthma, rash. His previous allergy office visit was on 03/21/2021 with Dr. [name]. Today is a regular follow up visit.  Other adverse food reactions Currently avoiding shellfish and mollusks. No reactions since the last visit. Patient was unable to pick up Symjepi due to expired coupon. Not sure what his insurance covers now.   Seasonal and perennial allergic rhinoconjunctivitis Currently has minimal symptoms with no medications.  Coughing  Noted some coughing at night when he is laying down due to a tickle down his throat. Takes tums for heartburn as needed with some benefit.  He is also on ace inhibitor for his hypertension.   Asthma Using albuterol 1-2 times a per month mainly when doing exercises in the cold weather.    Rash and other nonspecific skin eruption No dermatology appointment yet.  Usually has rash on the face. He uses a wash cloth and irish soap to wash his face on a daily basis.    Assessment and Plan: Ashby is a 46 y.o. male with: Seasonal and perennial allergic rhinoconjunctivitis Past history - some rhino conjunctivitis symptoms in the spring.  2022 skin testing showed: Positive to grass, trees, ragweed, weed pollen, dust mites, cockroach. Borderline to cat and dog. Question if the periorbital swelling in the HPI was due to environmental allergy exposure rather than food allergies given timeline of events. Interim history - some itchy eyes at times. Not taking any  medications.  Continue environmental control measures. Use Xyzal (levocetirizine) daily as needed. May take twice a day during allergy flares. May switch antihistamines every few months. Use azelastine nasal spray 1-2 sprays per nostril twice a day as needed for runny nose/drainage.  Other adverse food reactions, not elsewhere classified, subsequent encounter Past history - delayed reaction to shrimp as a child in the form of right eye swelling. Resolved without any intervention. Then had another episode of eye swelling as an adult but not sure of shellfish exposure. Tolerates finned fish with no issues.  2022 skin testing showed: Positive to shellfish, shrimp, crab, lobster, oyster.  Interim history - did not pick up symjepi Rx.  No reactions since last visit. Continue strict avoidance of shellfish and mollusks I have prescribed epinephrine injectable and demonstrated proper use. For mild symptoms you can take over the counter antihistamines such as Benadryl and monitor symptoms closely. If symptoms worsen or if you have severe symptoms including breathing issues, throat closure, significant swelling, whole body hives, severe diarrhea and vomiting, lightheadedness then inject epinephrine and seek immediate medical care afterwards. Let me know if the epinephrine is too expensive.  Food action plan in place.   Asthma Past history - asthma like symptoms for 30+ years. Main triggers are exertion and panic attacks. Uses albuterol once a week with good benefit.  2022 spirometry showed restriction most likely due to body habitus. Interim history - only using albuterol 1-2 times per month due to exertion related triggers. May use albuterol rescue inhaler 2 puffs every 4 to 6 hours as needed for  shortness of breath, chest tightness, coughing, and wheezing. May use albuterol rescue inhaler 2 puffs 5 to 15 minutes prior to strenuous physical activities. Monitor frequency of use.  Get spirometry at next  visit.  Rash and other nonspecific skin eruption Past history - Persistent facial rash even prior to the COVID-19 pandemic and mask wearing. No triggers noted. No prior dermatology evaluation. Interim history - unchanged and no dermatology evaluation. Not sure what's causing the rash - question if worsened by irritation from the washcloth and irish spring soap use. Continue proper skin care.  Don't use washcloth or the irish spring to wash face. Use lukewarm water and a mild facial soap - sample given.  Cough Coughing when laying down at night.  The most common causes of chronic cough include the following: upper airway cough syndrome (UACS) which is caused by variety of rhinitis conditions; asthma; gastroesophageal reflux disease (GERD); chronic bronchitis from cigarette smoking or other inhaled environmental irritants; non-asthmatic eosinophilic bronchitis; and bronchiectasis.  In prospective studies, these conditions have accounted for up to 94% of the causes of chronic cough in immunocompetent adults.  Given past medical history - possible that coughing is from PND and heartburn. Will try azelastine and Xyzal first.  Heartburn See handout for lifestyle and dietary modifications.  Return in about 3 months (around 10/29/2021).  Meds ordered this encounter  Medications   EPINEPHrine 0.3 mg/0.3 mL IJ SOAJ injection    Sig: Inject 0.3 mg into the muscle as needed for anaphylaxis.    Dispense:  1 each    Refill:  2    May dispense generic/Mylan/Teva brand.   levocetirizine (XYZAL) 5 MG tablet    Sig: Take 1 tablet (5 mg total) by mouth every evening.    Dispense:  30 tablet    Refill:  5   azelastine (ASTELIN) 0.1 % nasal spray    Sig: Place 1-2 sprays into both nostrils 2 (two) times daily as needed (nasal drainage). Use in each nostril as directed    Dispense:  30 mL    Refill:  5   Lab Orders  No laboratory test(s) ordered today    Diagnostics: None.  Medication List:   Current Outpatient Medications  Medication Sig Dispense Refill   albuterol (VENTOLIN HFA) 108 (90 Base) MCG/ACT inhaler Inhale 1 puff into the lungs every 6 (six) hours as needed for wheezing or shortness of breath. 8 g 1   amLODipine (NORVASC) 10 MG tablet Take 1 tablet (10 mg total) by mouth daily. 90 tablet 1   atorvastatin (LIPITOR) 20 MG tablet Take 1 tablet (20 mg total) by mouth daily. 90 tablet 3   azelastine (ASTELIN) 0.1 % nasal spray Place 1-2 sprays into both nostrils 2 (two) times daily as needed (nasal drainage). Use in each nostril as directed 30 mL 5   EPINEPHrine 0.3 mg/0.3 mL IJ SOAJ injection Inject 0.3 mg into the muscle as needed for anaphylaxis. 1 each 2   levocetirizine (XYZAL) 5 MG tablet Take 1 tablet (5 mg total) by mouth every evening. 30 tablet 5   lisinopril-hydrochlorothiazide (ZESTORETIC) 20-25 MG tablet Take 1 tablet by mouth daily. 90 tablet 1   meloxicam (MOBIC) 7.5 MG tablet Take 1 tablet (7.5 mg total) by mouth daily. 90 tablet 0   metFORMIN (GLUCOPHAGE) 500 MG tablet Take 1 tablet (500 mg total) by mouth 2 (two) times daily with a meal. 180 tablet 2   No current facility-administered medications for this visit.   Allergies: Allergies  Allergen Reactions   Crab Extract Allergy Skin Test    Shellfish Allergy    Shrimp Extract Allergy Skin Test    I reviewed his past medical history, social history, family history, and environmental history and no significant changes have been reported from his previous visit.  Review of Systems  Constitutional:  Negative for appetite change, chills, fever and unexpected weight change.  HENT:  Positive for postnasal drip. Negative for congestion and rhinorrhea.   Eyes:  Positive for itching.  Respiratory:  Positive for cough. Negative for chest tightness, shortness of breath and wheezing.   Cardiovascular:  Negative for chest pain.  Gastrointestinal:  Negative for abdominal pain.  Genitourinary:  Negative for  difficulty urinating.  Skin:  Positive for rash.  Allergic/Immunologic: Positive for environmental allergies and food allergies.  Neurological:  Negative for headaches.   Objective: BP 124/90 (BP Location: Left Arm, Patient Position: Sitting, Cuff Size: Large)    Pulse 82    Temp 98.5 F (36.9 C) (Temporal)    SpO2 96%  There is no height or weight on file to calculate BMI. Physical Exam Vitals and nursing note reviewed.  Constitutional:      Appearance: Normal appearance. He is well-developed. He is obese.  HENT:     Head: Normocephalic and atraumatic.     Right Ear: Tympanic membrane and external ear normal.     Left Ear: Tympanic membrane and external ear normal.     Nose: Nose normal.     Mouth/Throat:     Mouth: Mucous membranes are moist.     Pharynx: Oropharynx is clear.  Eyes:     Conjunctiva/sclera: Conjunctivae normal.  Cardiovascular:     Rate and Rhythm: Normal rate and regular rhythm.     Heart sounds: Normal heart sounds. No murmur heard.   No friction rub. No gallop.  Pulmonary:     Effort: Pulmonary effort is normal.     Breath sounds: Normal breath sounds. No wheezing, rhonchi or rales.  Musculoskeletal:     Cervical back: Neck supple.  Skin:    General: Skin is warm.     Findings: Rash present.     Comments: Erythematous hue on the face.  Neurological:     Mental Status: He is alert and oriented to person, place, and time.  Psychiatric:        Behavior: Behavior normal.  Previous notes and tests were reviewed. The plan was reviewed with the patient/family, and all questions/concerned were addressed.  It was my pleasure to see Edward Carr today and participate in his care. Please feel free to contact me with any questions or concerns.  Sincerely,  Rexene Alberts, DO Allergy & Immunology  [facility] office: [place] office: [phone]

## 2021-07-31 ENCOUNTER — Encounter: Payer: Self-pay | Admitting: Allergy

## 2021-07-31 ENCOUNTER — Ambulatory Visit: Payer: No Typology Code available for payment source | Admitting: Allergy

## 2021-07-31 ENCOUNTER — Other Ambulatory Visit: Payer: Self-pay

## 2021-07-31 VITALS — BP 124/90 | HR 82 | Temp 98.5°F

## 2021-07-31 DIAGNOSIS — H1013 Acute atopic conjunctivitis, bilateral: Secondary | ICD-10-CM

## 2021-07-31 DIAGNOSIS — T781XXD Other adverse food reactions, not elsewhere classified, subsequent encounter: Secondary | ICD-10-CM

## 2021-07-31 DIAGNOSIS — R12 Heartburn: Secondary | ICD-10-CM

## 2021-07-31 DIAGNOSIS — H1010 Acute atopic conjunctivitis, unspecified eye: Secondary | ICD-10-CM

## 2021-07-31 DIAGNOSIS — J302 Other seasonal allergic rhinitis: Secondary | ICD-10-CM

## 2021-07-31 DIAGNOSIS — R21 Rash and other nonspecific skin eruption: Secondary | ICD-10-CM

## 2021-07-31 DIAGNOSIS — R059 Cough, unspecified: Secondary | ICD-10-CM | POA: Insufficient documentation

## 2021-07-31 DIAGNOSIS — J3089 Other allergic rhinitis: Secondary | ICD-10-CM

## 2021-07-31 DIAGNOSIS — J4520 Mild intermittent asthma, uncomplicated: Secondary | ICD-10-CM

## 2021-07-31 DIAGNOSIS — R058 Other specified cough: Secondary | ICD-10-CM

## 2021-07-31 MED ORDER — LEVOCETIRIZINE DIHYDROCHLORIDE 5 MG PO TABS: 5.0000 mg | ORAL_TABLET | Freq: Every evening | ORAL | 5 refills | Status: DC

## 2021-07-31 MED ORDER — AZELASTINE HCL 0.1 % NA SOLN: 1.0000 | Freq: Two times a day (BID) | NASAL | 5 refills | Status: DC | PRN

## 2021-07-31 MED ORDER — EPINEPHRINE 0.3 MG/0.3ML IJ SOAJ: 0.3000 mg | INTRAMUSCULAR | 2 refills | Status: DC | PRN

## 2021-07-31 NOTE — Assessment & Plan Note (Signed)
Past history - delayed reaction to shrimp as a child in the form of right eye swelling. Resolved without any intervention. Then had another episode of eye swelling as an adult but not sure of shellfish exposure. Tolerates finned fish with no issues.  2022 skin testing showed: Positive to shellfish, shrimp, crab, lobster, oyster.  Interim history - did not pick up symjepi Rx.  No reactions since last visit.  Continue strict avoidance of shellfish and mollusks  I have prescribed epinephrine injectable and demonstrated proper use. For mild symptoms you can take over the counter antihistamines such as Benadryl and monitor symptoms closely. If symptoms worsen or if you have severe symptoms including breathing issues, throat closure, significant swelling, whole body hives, severe diarrhea and vomiting, lightheadedness then inject epinephrine and seek immediate medical care afterwards.  Let me know if the epinephrine is too expensive.   Food action plan in place.

## 2021-07-31 NOTE — Patient Instructions (Addendum)
Environmental allergies: 2022 skin testing showed: Positive to grass, trees, ragweed, weed pollen, dust mites, cockroach. Borderline to cat and dog.  Continue environmental control measures as below. Use Xyzal (levocetirizine) daily as needed. May take twice a day during allergy flares. May switch antihistamines every few months. Use azelastine nasal spray 1-2 sprays per nostril twice a day as needed for runny nose/drainage.  Food allergy: Continue strict avoidance of shellfish and mollusks I have prescribed epinephrine injectable and demonstrated proper use. For mild symptoms you can take over the counter antihistamines such as Benadryl and monitor symptoms closely. If symptoms worsen or if you have severe symptoms including breathing issues, throat closure, significant swelling, whole body hives, severe diarrhea and vomiting, lightheadedness then inject epinephrine and seek immediate medical care afterwards. Let me know if the epinephrine is too expensive.  Food action plan in place.   Asthma:  May use albuterol rescue inhaler 2 puffs every 4 to 6 hours as needed for shortness of breath, chest tightness, coughing, and wheezing. May use albuterol rescue inhaler 2 puffs 5 to 15 minutes prior to strenuous physical activities. Monitor frequency of use.   Skin: Ask your PCP for dermatology referral. Continue proper skin care.  Don't use washcloth or the irish spring to wash your face. Use lukewarm water with your hands and a mild facial soap - sample given.  Coughing: Not sure where the coughing is coming from - can be from post nasal drip, heartburn.  Heartburn: See handout for lifestyle and dietary modifications.  Follow up in 3 months or sooner if needed.   Skin care recommendations  Bath time: Always use lukewarm water. AVOID very hot or cold water. Keep bathing time to 5-10 minutes. Do NOT use bubble bath. Use a mild soap and use just enough to wash the dirty areas. Do NOT scrub  skin vigorously.  After bathing, pat dry your skin with a towel. Do NOT rub or scrub the skin.  Moisturizers and prescriptions:  ALWAYS apply moisturizers immediately after bathing (within 3 minutes). This helps to lock-in moisture. Use the moisturizer several times a day over the whole body. Good summer moisturizers include: Aveeno, CeraVe, Cetaphil. Good winter moisturizers include: Aquaphor, Vaseline, Cerave, Cetaphil, Eucerin, Vanicream. When using moisturizers along with medications, the moisturizer should be applied about one hour after applying the medication to prevent diluting effect of the medication or moisturize around where you applied the medications. When not using medications, the moisturizer can be continued twice daily as maintenance.  Laundry and clothing: Avoid laundry products with added color or perfumes. Use unscented hypo-allergenic laundry products such as Tide free, Cheer free & gentle, and All free and clear.  If the skin still seems dry or sensitive, you can try double-rinsing the clothes. Avoid tight or scratchy clothing such as wool. Do not use fabric softeners or dyer sheets.

## 2021-07-31 NOTE — Assessment & Plan Note (Signed)
Past history - some rhino conjunctivitis symptoms in the spring.  2022 skin testing showed: Positive to grass, trees, ragweed, weed pollen, dust mites, cockroach. Borderline to cat and dog. Question if the periorbital swelling in the HPI was due to environmental allergy exposure rather than food allergies given timeline of events. Interim history - some itchy eyes at times. Not taking any medications.   Continue environmental control measures.  Use Xyzal (levocetirizine) daily as needed. May take twice a day during allergy flares. May switch antihistamines every few months.  Use azelastine nasal spray 1-2 sprays per nostril twice a day as needed for runny nose/drainage.

## 2021-07-31 NOTE — Assessment & Plan Note (Signed)
Past history - asthma like symptoms for 30+ years. Main triggers are exertion and panic attacks. Uses albuterol once a week with good benefit.  2022 spirometry showed restriction most likely due to body habitus. Interim history - only using albuterol 1-2 times per month due to exertion related triggers.  May use albuterol rescue inhaler 2 puffs every 4 to 6 hours as needed for shortness of breath, chest tightness, coughing, and wheezing. May use albuterol rescue inhaler 2 puffs 5 to 15 minutes prior to strenuous physical activities. Monitor frequency of use.   Get spirometry at next visit.

## 2021-07-31 NOTE — Assessment & Plan Note (Signed)
Past history - Persistent facial rash even prior to the COVID-19 pandemic and mask wearing. No triggers noted. No prior dermatology evaluation. Interim history - unchanged and no dermatology evaluation.  Not sure what's causing the rash - question if worsened by irritation from the washcloth and irish spring soap use.  Continue proper skin care.   Don't use washcloth or the irish spring to wash face.  Use lukewarm water and a mild facial soap - sample given.

## 2021-07-31 NOTE — Assessment & Plan Note (Signed)
Coughing when laying down at night.   The most common causes of chronic cough include the following: upper airway cough syndrome (UACS) which is caused by variety of rhinitis conditions; asthma; gastroesophageal reflux disease (GERD); chronic bronchitis from cigarette smoking or other inhaled environmental irritants; non-asthmatic eosinophilic bronchitis; and bronchiectasis.   In prospective studies, these conditions have accounted for up to 94% of the causes of chronic cough in immunocompetent adults.   Given past medical history - possible that coughing is from PND and heartburn. o Will try azelastine and Xyzal first.   Heartburn  See handout for lifestyle and dietary modifications.

## 2021-08-02 ENCOUNTER — Encounter: Payer: Self-pay | Admitting: Allergy

## 2021-08-16 ENCOUNTER — Encounter: Payer: Self-pay | Admitting: Dietician

## 2021-08-16 ENCOUNTER — Encounter: Payer: PRIVATE HEALTH INSURANCE | Attending: Family Medicine | Admitting: Dietician

## 2021-08-16 ENCOUNTER — Other Ambulatory Visit: Payer: Self-pay

## 2021-08-16 DIAGNOSIS — E119 Type 2 diabetes mellitus without complications: Secondary | ICD-10-CM | POA: Insufficient documentation

## 2021-08-16 NOTE — Progress Notes (Signed)
Diabetes Self-Management Education  Visit Type: Follow-up  Appt. Start Time: 1650 Appt. End Time: 5993  08/16/2021  [name], identified by name and date of birth, is a 46 y.o. male with a diagnosis of Diabetes:  .   ASSESSMENT Pt A1c has risen to 6.5 from 6.3 in the last 3 months. Pt reports being frustrated with their A1c continuing to elevate. Pt has not been active since last visit, continues to eat inconsistent meals high in fat and refined carbs. Pt states they want to know what they can do get their A1c back down again.   Height 5\' 5"  (1.651 m), weight (!) 319 lb 3.2 oz (144.8 kg). Body mass index is 53.12 kg/m.   Diabetes Self-Management Education - 08/16/21 1719       Visit Information   Visit Type Follow-up      Dietary Intake   Breakfast Double cheeseburger, lettuce, mayo, bacon, diet gingerale    Lunch 1 can of Pizza flavored Pringles    Dinner 4 tacos w/ ground beef, cheese, lettuce, taco sauce, cheddar dusted shells, Diet sprite    Beverage(s) Diet Sprite      Exercise   Exercise Type ADL's    How many days per week to you exercise? 0    How many minutes per day do you exercise? 0    Total minutes per week of exercise 0      Patient Education   Nutrition management  Carbohydrate counting;Food label reading, portion sizes and measuring food.;Role of diet in the treatment of diabetes and the relationship between the three main macronutrients and blood glucose level    Physical activity and exercise  Role of exercise on diabetes management, blood pressure control and cardiac health.;Helped patient identify appropriate exercises in relation to his/her diabetes, diabetes complications and other health issue.      Individualized Goals (developed by patient)   Nutrition Follow meal plan discussed;General guidelines for healthy choices and portions discussed    Physical Activity Exercise 1-2 times per week      Patient Self-Evaluation of Goals - Patient rates  self as meeting previously set goals (% of time)   Nutrition < 25%    Physical Activity < 25%    Medications >75%    Monitoring Not Applicable    Problem Solving < 25%    Reducing Risk < 25%    Health Coping < 25%      Post-Education Assessment   Patient understands the diabetes disease and treatment process. Needs Review    Patient understands incorporating nutritional management into lifestyle. Needs Review    Patient undertands incorporating physical activity into lifestyle. Needs Review    Patient understands using medications safely. Needs Review    Patient understands monitoring blood glucose, interpreting and using results Needs Review    Patient understands prevention, detection, and treatment of acute complications. Needs Review    Patient understands prevention, detection, and treatment of chronic complications. Needs Review    Patient understands how to develop strategies to address psychosocial issues. Needs Review    Patient understands how to develop strategies to promote health/change behavior. Needs Review      Outcomes   Expected Outcomes Demonstrated limited interest in learning.  Expect minimal changes    Future DMSE 3-4 months    Program Status Not Completed      Subsequent Visit   Since your last visit have you continued or begun to take your medications as prescribed? Yes  Since your last visit have you had your blood pressure checked? No    Since your last visit have you experienced any weight changes? Loss    Weight Loss (lbs) 3    Since your last visit, are you checking your blood glucose at least once a day? N/A             Individualized Plan for Diabetes Self-Management Training:   Learning Objective:  Patient will have a greater understanding of diabetes self-management. Patient education plan is to attend individual and/or group sessions per assessed needs and concerns.   Plan:   Patient Instructions  Work towards eating three meals a day,  about 5-6 hours apart!  Begin to recognize carbohydrates, proteins, and non-starchy vegetables in your food choices!  Have 4 carb choices at each meal (60 g).   Begin to build your meals using the proportions of the Balanced Plate. First, select your carb choice(s) for the meal, and determine how much you should have to equal 4 carb choices (60 g).Make this 25% of your meal. Next, select your source of protein to pair with your carb choice(s). Make this another 25% of your meal. Finally, complete your meal with a variety of non-starchy vegetables. Make this the remaining 50% of your meal.  Begin to increase your physical activity gradually and consistently. The goal is to get 150 minutes in a week! Start by walking to the store twice a week!  Expected Outcomes:  Demonstrated limited interest in learning.  Expect minimal changes  Education material provided: Carbohydrate counting sheet  If problems or questions, patient to contact team via:  Phone and Email  Future DSME appointment: 3-4 months

## 2021-08-16 NOTE — Patient Instructions (Addendum)
Work towards eating three meals a day, about 5-6 hours apart!  Begin to recognize carbohydrates, proteins, and non-starchy vegetables in your food choices!  Have 4 carb choices at each meal (60 g).   Begin to build your meals using the proportions of the Balanced Plate. First, select your carb choice(s) for the meal, and determine how much you should have to equal 4 carb choices (60 g).Make this 25% of your meal. Next, select your source of protein to pair with your carb choice(s). Make this another 25% of your meal. Finally, complete your meal with a variety of non-starchy vegetables. Make this the remaining 50% of your meal.  Begin to increase your physical activity gradually and consistently. The goal is to get 150 minutes in a week! Start by walking to the store twice a week!

## 2021-10-03 ENCOUNTER — Encounter: Payer: Self-pay | Admitting: Family Medicine

## 2021-10-03 ENCOUNTER — Other Ambulatory Visit: Payer: Self-pay

## 2021-10-03 ENCOUNTER — Ambulatory Visit (INDEPENDENT_AMBULATORY_CARE_PROVIDER_SITE_OTHER): Payer: PRIVATE HEALTH INSURANCE | Admitting: Family Medicine

## 2021-10-03 VITALS — BP 125/78 | HR 95 | Temp 97.9°F | Ht 65.0 in | Wt 320.2 lb

## 2021-10-03 DIAGNOSIS — I10 Essential (primary) hypertension: Secondary | ICD-10-CM

## 2021-10-03 DIAGNOSIS — E119 Type 2 diabetes mellitus without complications: Secondary | ICD-10-CM

## 2021-10-03 DIAGNOSIS — E785 Hyperlipidemia, unspecified: Secondary | ICD-10-CM

## 2021-10-03 DIAGNOSIS — Z23 Encounter for immunization: Secondary | ICD-10-CM

## 2021-10-03 LAB — POCT GLYCOSYLATED HEMOGLOBIN (HGB A1C)
HbA1c POC (<> result, manual entry): 6.4 % (ref 4.0–5.6)
HbA1c, POC (controlled diabetic range): 6.4 % (ref 0.0–7.0)
HbA1c, POC (prediabetic range): 6.4 % (ref 5.7–6.4)
Hemoglobin A1C: 6.4 % — AB (ref 4.0–5.6)

## 2021-10-03 LAB — POCT URINALYSIS DIP (CLINITEK)
Bilirubin, UA: NEGATIVE
Blood, UA: NEGATIVE
Glucose, UA: NEGATIVE mg/dL
Ketones, POC UA: NEGATIVE mg/dL
Leukocytes, UA: NEGATIVE
Nitrite, UA: NEGATIVE
POC PROTEIN,UA: NEGATIVE
Spec Grav, UA: >=1.03 — AB (ref 1.010–1.025)
Urobilinogen, UA: 0.2 E.U./dL
pH, UA: 5.5 (ref 5.0–8.0)

## 2021-10-03 MED ORDER — AMLODIPINE BESYLATE 10 MG PO TABS: 10.0000 mg | ORAL_TABLET | Freq: Every day | ORAL | 1 refills | Status: DC

## 2021-10-03 MED ORDER — ATORVASTATIN CALCIUM 20 MG PO TABS: 20.0000 mg | ORAL_TABLET | Freq: Every day | ORAL | 3 refills | Status: DC

## 2021-10-03 MED ORDER — LISINOPRIL-HYDROCHLOROTHIAZIDE 20-25 MG PO TABS: 1.0000 | ORAL_TABLET | Freq: Every day | ORAL | 1 refills | Status: DC

## 2021-10-03 NOTE — Progress Notes (Signed)
Patient [facility]   Established Patient Office Visit  Subjective:  Patient ID: Edward Carr, male    DOB: 1976-03-21  Age: 46 y.o. MRN: [id number]  CC:  Chief Complaint  Patient presents with   Follow-up    Patient is here today for his 3 month follow up visit and to get his blood pressure medication refilled.    HPI  Edward Carr is a 46 year old male with a medical history significant for hypertension, hyperlipidemia, type 2 diabetes mellitus, and morbid obesity presents for follow-up of chronic conditions.  Patient states that he has been feeling well and is without complaint on today.  He has been taking all medications consistently.  He does not check blood glucose or blood pressure at home.  He says that he has a blood pressure cuff, but does not know how to use it.  Diabetes He has type 2 diabetes mellitus. His disease course has been stable. Pertinent negatives for diabetes include no blurred vision, no fatigue, no foot paresthesias, no polydipsia, no polyphagia, no polyuria and no visual change. Risk factors for coronary artery disease include dyslipidemia, obesity, male sex and sedentary lifestyle. Current diabetic treatment includes oral agent (monotherapy). He is compliant with treatment most of the time. He has had a previous visit with a dietitian. He rarely participates in exercise. An ACE inhibitor/angiotensin II receptor blocker is being taken. He does not see a podiatrist.Eye exam is not current.  Hypertension This is a chronic problem. The problem is controlled. Pertinent negatives include no blurred vision, orthopnea, palpitations, peripheral edema or shortness of breath. Compliance problems include diet and exercise.  There is no history of kidney disease or heart failure.  Hyperlipidemia This is a chronic problem. The problem is uncontrolled. Exacerbating diseases include diabetes and obesity. Factors aggravating his  hyperlipidemia include fatty foods. Pertinent negatives include no shortness of breath. Current antihyperlipidemic treatment includes statins.   Past Medical History:  Diagnosis Date   Asthma    Diabetes mellitus without complication (Edward Carr)    Hypertension     Past Surgical History:  Procedure Laterality Date   DENTAL SURGERY      Family History  Problem Relation Age of Onset   Diabetes Mother    Cancer Father    Cancer Sister    Asthma Sister     Social History   Socioeconomic History   Marital status: Single    Spouse name: Not on file   Number of children: Not on file   Years of education: Not on file   Highest education level: Not on file  Occupational History   Not on file  Tobacco Use   Smoking status: Never   Smokeless tobacco: Never  Vaping Use   Vaping Use: Never used  Substance and Sexual Activity   Alcohol use: No   Drug use: No   Sexual activity: Never  Other Topics Concern   Not on file  Social History Narrative   Not on file   Social Determinants of Health   Financial Resource Strain: Not on file  Food Insecurity: Not on file  Transportation Needs: Not on file  Physical Activity: Not on file  Stress: Not on file  Social Connections: Not on file  Intimate Partner Violence: Not on file    Outpatient Medications Prior to Visit  Medication Sig Dispense Refill   amLODipine (NORVASC) 10 MG tablet Take 1 tablet (10 mg total) by mouth daily. 90 tablet 1  levocetirizine (XYZAL) 5 MG tablet Take 1 tablet (5 mg total) by mouth every evening. 30 tablet 5   lisinopril-hydrochlorothiazide (ZESTORETIC) 20-25 MG tablet Take 1 tablet by mouth daily. 90 tablet 1   metFORMIN (GLUCOPHAGE) 500 MG tablet Take 1 tablet (500 mg total) by mouth 2 (two) times daily with a meal. 180 tablet 2   albuterol (VENTOLIN HFA) 108 (90 Base) MCG/ACT inhaler Inhale 1 puff into the lungs every 6 (six) hours as needed for wheezing or shortness of breath. (Patient not taking:  Reported on 10/03/2021) 8 g 1   atorvastatin (LIPITOR) 20 MG tablet Take 1 tablet (20 mg total) by mouth daily. (Patient not taking: Reported on 10/03/2021) 90 tablet 3   azelastine (ASTELIN) 0.1 % nasal spray Place 1-2 sprays into both nostrils 2 (two) times daily as needed (nasal drainage). Use in each nostril as directed (Patient not taking: Reported on 10/03/2021) 30 mL 5   EPINEPHrine 0.3 mg/0.3 mL IJ SOAJ injection Inject 0.3 mg into the muscle as needed for anaphylaxis. (Patient not taking: Reported on 10/03/2021) 1 each 2   meloxicam (MOBIC) 7.5 MG tablet Take 1 tablet (7.5 mg total) by mouth daily. (Patient not taking: Reported on 10/03/2021) 90 tablet 0   No facility-administered medications prior to visit.    Allergies  Allergen Reactions   Crab Extract Allergy Skin Test    Shellfish Allergy    Shrimp Extract Allergy Skin Test     ROS Review of Systems  Constitutional:  Negative for fatigue.  HENT: Negative.    Eyes: Negative.  Negative for blurred vision.  Respiratory: Negative.  Negative for shortness of breath.   Cardiovascular:  Negative for palpitations and orthopnea.  Gastrointestinal: Negative.   Endocrine: Negative for polydipsia, polyphagia and polyuria.  Genitourinary: Negative.   Skin: Negative.   Neurological: Negative.   Psychiatric/Behavioral: Negative.       Objective:    Physical Exam Constitutional:      Appearance: Normal appearance. He is obese.  Eyes:     Pupils: Pupils are equal, round, and reactive to light.  Cardiovascular:     Rate and Rhythm: Normal rate and regular rhythm.  Pulmonary:     Effort: Pulmonary effort is normal.     Breath sounds: Normal breath sounds.  Abdominal:     General: Bowel sounds are normal.  Skin:    General: Skin is warm.  Neurological:     General: No focal deficit present.     Mental Status: Mental status is at baseline.  Psychiatric:        Mood and Affect: Mood normal.        Behavior: Behavior normal.         Thought Content: Thought content normal.        Judgment: Judgment normal.    BP 125/78    Pulse 95    Temp 97.9 F (36.6 C)    Ht 5' 5" (1.651 m)    Wt (!) 320 lb 3.2 oz (145.2 kg)    SpO2 98%    BMI 53.28 kg/m  Wt Readings from Last 3 Encounters:  10/03/21 (!) 320 lb 3.2 oz (145.2 kg)  08/16/21 (!) 319 lb 3.2 oz (144.8 kg)  07/04/21 (!) 325 lb 0.2 oz (147.4 kg)     Health Maintenance Due  Topic Date Due   OPHTHALMOLOGY EXAM  Never done   COVID-19 Vaccine (4 - Booster) 01/09/2020    There are no preventive care reminders to display  for this patient.  Lab Results  Component Value Date   TSH 1.430 12/11/2019   Lab Results  Component Value Date   WBC 10.3 12/11/2019   HGB 13.7 12/11/2019   HCT 40.7 12/11/2019   MCV 82 12/11/2019   PLT 395 12/11/2019   Lab Results  Component Value Date   NA 139 03/28/2021   K 4.3 03/28/2021   CO2 25 03/28/2021   GLUCOSE 100 (H) 03/28/2021   BUN 18 03/28/2021   CREATININE 1.13 03/28/2021   BILITOT 0.4 03/28/2021   ALKPHOS 66 03/28/2021   AST 17 03/28/2021   ALT 18 03/28/2021   PROT 7.2 03/28/2021   ALBUMIN 4.5 03/28/2021   CALCIUM 10.1 03/28/2021   EGFR 83 03/28/2021   Lab Results  Component Value Date   CHOL 181 03/28/2021   Lab Results  Component Value Date   HDL 41 03/28/2021   Lab Results  Component Value Date   LDLCALC 123 (H) 03/28/2021   Lab Results  Component Value Date   TRIG 93 03/28/2021   Lab Results  Component Value Date   CHOLHDL 4.4 03/28/2021   Lab Results  Component Value Date   HGBA1C 6.5 (A) 07/04/2021   HGBA1C 6.5 07/04/2021   HGBA1C 6.5 (A) 07/04/2021   HGBA1C 6.5 07/04/2021      Assessment & Plan:   Problem List Items Addressed This Visit       Cardiovascular and Mediastinum   Essential hypertension   Relevant Orders   HgB A1c   POCT URINALYSIS DIP (CLINITEK)   Other Visit Diagnoses     Type 2 diabetes mellitus without complication, without long-term current use of insulin  (HCC)    -  Primary   Relevant Orders   HgB A1c   POCT URINALYSIS DIP (CLINITEK)      1. Type 2 diabetes mellitus without complication, without long-term current use of insulin (HCC) Hemoglobin A1c is 6.5, consistent with previous.  A1c is at goal.  Recommend a low-fat, low carbohydrate diet divided over small meals.  Also discussed the importance of exercising 3-5 days/week, low impact cardiovascular.  Also discussed the importance of taking medications consistently in order to achieve positive outcomes.  Patient expressed understanding.  No further medication changes warranted today. - HgB A1c - POCT URINALYSIS DIP (CLINITEK)  2. Essential hypertension BP 125/78    Pulse 95    Temp 97.9 F (36.6 C)    Ht 5' 5" (1.651 m)    Wt (!) 320 lb 3.2 oz (145.2 kg)    SpO2 98%    BMI 53.28 kg/m  - Continue medication, monitor blood pressure at home. Continue DASH diet.  Reminder to go to the ER if any CP, SOB, nausea, dizziness, severe HA, changes vision/speech, left arm numbness and tingling and jaw pain.   - HgB A1c - POCT URINALYSIS DIP (CLINITEK) - lisinopril-hydrochlorothiazide (ZESTORETIC) 20-25 MG tablet; Take 1 tablet by mouth daily.  Dispense: 90 tablet; Refill: 1 - amLODipine (NORVASC) 10 MG tablet; Take 1 tablet (10 mg total) by mouth daily.  Dispense: 90 tablet; Refill: 1 - Comprehensive metabolic panel  3. Hyperlipidemia LDL goal <100 The 10-year ASCVD risk score (Arnett DK, et al., 2019) is: 11.2%   Values used to calculate the score:     Age: 18 years     Sex: Male     Is Non-Hispanic African American: Yes     Diabetic: Yes     Tobacco smoker: No  Systolic Blood Pressure: 921 mmHg     Is BP treated: Yes     HDL Cholesterol: 41 mg/dL     Total Cholesterol: 181 mg/dL  - atorvastatin (LIPITOR) 20 MG tablet; Take 1 tablet (20 mg total) by mouth daily.  Dispense: 90 tablet; Refill: 3 - Lipid Panel - Comprehensive metabolic panel  4. Influenza vaccine needed  - Flu  Vaccine QUAD 36+ mos IM (Fluarix, Fluzone & Afluria Quad PF  No orders of the defined types were placed in this encounter.   Follow-up: Return in about 3 months (around 01/03/2022) for obesity, diabetes, hypertension, hyperlipidemia.    [name]  APRN, MSN, FNP-C [facility] [address] [place], [place] [address] [phone]

## 2021-10-03 NOTE — Patient Instructions (Addendum)
Constipation, Adult ?Constipation is when a person has trouble pooping (having a bowel movement). When you have this condition, you may poop fewer than 3 times a week. Your poop (stool) may also be dry, hard, or bigger than normal. ?Follow these instructions at home: ?Eating and drinking ? ?Eat foods that have a lot of fiber, such as: ?Fresh fruits and vegetables. ?Whole grains. ?Beans. ?Eat less of foods that are low in fiber and high in fat and sugar, such as: ?[place] fries. ?Hamburgers. ?Cookies. ?Candy. ?Soda. ?Drink enough fluid to keep your pee (urine) pale yellow. ?General instructions ?Exercise regularly or as told by your doctor. Try to do 150 minutes of exercise each week. ?Go to the restroom when you feel like you need to poop. Do not hold it in. ?Take over-the-counter and prescription medicines only as told by your doctor. These include any fiber supplements. ?When you poop: ?Do deep breathing while relaxing your lower belly (abdomen). ?Relax your pelvic floor. The pelvic floor is a group of muscles that support the rectum, bladder, and intestines (as well as the uterus in women). ?Watch your condition for any changes. Tell your doctor if you notice any. ?Keep all follow-up visits as told by your doctor. This is important. ?Contact a doctor if: ?You have pain that gets worse. ?You have a fever. ?You have not pooped for 4 days. ?You vomit. ?You are not hungry. ?You lose weight. ?You are bleeding from the opening of the butt (anus). ?You have thin, pencil-like poop. ?Get help right away if: ?You have a fever, and your symptoms suddenly get worse. ?You leak poop or have blood in your poop. ?Your belly feels hard or bigger than normal (bloated). ?You have very bad belly pain. ?You feel dizzy or you faint. ?Summary ?Constipation is when a person poops fewer than 3 times a week, has trouble pooping, or has poop that is dry, hard, or bigger than normal. ?Eat foods that have a lot of fiber. ?Drink enough fluid  to keep your pee (urine) pale yellow. ?Take over-the-counter and prescription medicines only as told by your doctor. These include any fiber supplements. ?This information is not intended to replace advice given to you by your health care provider. Make sure you discuss any questions you have with your health care provider. ?Document Revised: 06/03/2019 Document Reviewed: 06/03/2019 ?Elsevier Patient Education ? [place]. ?Diabetes Mellitus Action Plan ?Following a diabetes action plan is a way for you to manage your diabetes (diabetes mellitus) symptoms. The plan is color-coded to help you understand what actions you need to take based on any symptoms you are having. ?If you have symptoms in the red zone, you need medical care right away. ?If you have symptoms in the yellow zone, you are having problems. ?If you have symptoms in the green zone, you are doing well. ?Learning about and understanding diabetes can take time. Follow the plan that you develop with your health care provider. Know the target range for your blood sugar (glucose) level, and review your treatment plan with your health care provider at each visit. ?The target range for my blood sugar level is __________________________ mg/dL. ?Red zone ?Get medical help right away if you have any of the following symptoms: ?A blood sugar test result that is below 54 mg/dL (3 mmol/L). ?A blood sugar test result that is at or above 240 mg/dL (13.3 mmol/L) for 2 days in a row. ?Confusion or trouble thinking clearly. ?Difficulty breathing. ?Sickness or a fever for  2 or more days that is not getting better. ?Moderate or large ketone levels in your urine. ?Feeling tired or having no energy. ?If you have any red zone symptoms, do not wait to see if the symptoms will go away. Get medical help right away. Call your local emergency services (911 in the [place] Do not drive yourself to the hospital. ?If you have severely low blood sugar (severe hypoglycemia) and  you cannot eat or drink, you may need glucagon. Make sure a family member or close friend knows how to check your blood sugar and how to give you glucagon. You may need to be treated in a hospital for this condition. ?Yellow zone ?If you have any of the following symptoms, your diabetes is not under control and you may need to make some changes: ?A blood sugar test result that is at or above 240 mg/dL (13.3 mmol/L) for 2 days in a row. ?Blood sugar test results that are below 70 mg/dL (3.9 mmol/L). ?Other symptoms of hypoglycemia, such as: ?Shaking or feeling light-headed. ?Confusion or irritability. ?Feeling hungry. ?Having a fast heartbeat. ?If you have any yellow zone symptoms: ?Treat your hypoglycemia by eating or drinking 15 grams of a rapid-acting carbohydrate. Follow the 15:15 rule: ?Take 15 grams of a rapid-acting carbohydrate, such as: ?1 tube of glucose gel. ?4 glucose pills. ?4 oz (120 mL) of fruit juice. ?4 oz (120 mL) of regular (not diet) soda. ?Check your blood sugar 15 minutes after you take the carbohydrate. ?If the repeat blood sugar test is still at or below 70 mg/dL (3.9 mmol/L), take 15 grams of a carbohydrate again. ?If your blood sugar does not increase above 70 mg/dL (3.9 mmol/L) after 3 tries, get medical help right away. ?After your blood sugar returns to normal, eat a meal or a snack within 1 hour. ?Keep taking your daily medicines as told by your health care provider. ?Check your blood sugar more often than you normally would. ?Write down your results. ?Call your health care provider if you have trouble keeping your blood sugar in your target range. ? ?Green zone ?These signs mean you are doing well and you can continue what you are doing to manage your diabetes: ?Your blood sugar is within your personal target range. For most people, a blood sugar level before a meal (preprandial) should be 80-130 mg/dL (4.4-7.2 mmol/L). ?You feel well, and you are able to do daily activities. ?If you  are in the green zone, continue to manage your diabetes as told by your health care provider. To do this: ?Eat a healthy diet. ?Exercise regularly. ?Check your blood sugar as told by your health care provider. ?Take your medicines as told by your health care provider. ? ?Where to find more information ?American Diabetes Association (ADA): diabetes.org ?Association of Diabetes Care & Education Specialists (ADCES): diabeteseducator.org ?Summary ?Following a diabetes action plan is a way for you to manage your diabetes symptoms. The plan is color-coded to help you understand what actions you need to take based on any symptoms you are having. ?Follow the plan that you develop with your health care provider. Make sure you know your personal target blood sugar level. ?Review your treatment plan with your health care provider at each visit. ?This information is not intended to replace advice given to you by your health care provider. Make sure you discuss any questions you have with your health care provider. ?Document Revised: 01/21/2020 Document Reviewed: 01/21/2020 ?Elsevier Patient Education ? [place]. ? ?

## 2021-10-06 LAB — COMPREHENSIVE METABOLIC PANEL
ALT: 17 IU/L (ref 0–44)
AST: 18 IU/L (ref 0–40)
Albumin/Globulin Ratio: 1.5 (ref 1.2–2.2)
Albumin: 4.6 g/dL (ref 4.0–5.0)
Alkaline Phosphatase: 77 IU/L (ref 44–121)
BUN/Creatinine Ratio: 14 (ref 9–20)
BUN: 20 mg/dL (ref 6–24)
Bilirubin Total: 0.6 mg/dL (ref 0.0–1.2)
CO2: 18 mmol/L — ABNORMAL LOW (ref 20–29)
Calcium: 9.9 mg/dL (ref 8.7–10.2)
Chloride: 97 mmol/L (ref 96–106)
Creatinine, Ser: 1.4 mg/dL — ABNORMAL HIGH (ref 0.76–1.27)
Globulin, Total: 3 g/dL (ref 1.5–4.5)
Glucose: 75 mg/dL (ref 70–99)
Potassium: 4.4 mmol/L (ref 3.5–5.2)
Sodium: 139 mmol/L (ref 134–144)
Total Protein: 7.6 g/dL (ref 6.0–8.5)
eGFR: 64 mL/min/{1.73_m2} (ref >59)

## 2021-10-06 LAB — LIPID PANEL
Chol/HDL Ratio: 4.7 ratio (ref 0.0–5.0)
Cholesterol, Total: 174 mg/dL (ref 100–199)
HDL: 37 mg/dL — ABNORMAL LOW (ref >39)
LDL Chol Calc (NIH): 110 mg/dL — ABNORMAL HIGH (ref 0–99)
Triglycerides: 150 mg/dL — ABNORMAL HIGH (ref 0–149)
VLDL Cholesterol Cal: 27 mg/dL (ref 5–40)

## 2021-10-16 ENCOUNTER — Ambulatory Visit: Payer: PRIVATE HEALTH INSURANCE | Admitting: Dietician

## 2021-10-17 ENCOUNTER — Encounter: Payer: PRIVATE HEALTH INSURANCE | Attending: Family Medicine | Admitting: Dietician

## 2021-10-17 ENCOUNTER — Encounter: Payer: Self-pay | Admitting: Dietician

## 2021-10-17 ENCOUNTER — Other Ambulatory Visit: Payer: Self-pay

## 2021-10-17 DIAGNOSIS — E119 Type 2 diabetes mellitus without complications: Secondary | ICD-10-CM | POA: Insufficient documentation

## 2021-10-17 NOTE — Patient Instructions (Addendum)
Take a look at walking in [place] [address]. There are bus stops right at the walking paths. Take a short walk around your apartment complex after lunch time. ? ?Work on having a sandwich on whole wheat bread with lettuce, low-fat deli meat, and baked chips on the side. ? ?Try a few different seltzer waters to drink. Make sure the label says "0 Calories". ? ?Get yourself a smaller bowl (8 -10 oz) for your snacks. Mix your pecans or cashews with popcorn for your snacks. ? ? ? ?

## 2021-10-17 NOTE — Progress Notes (Signed)
Diabetes Self-Management Education ? ?Visit Type: Follow-up ? ?Appt. Start Time: 1420 Appt. End Time: 5277 ? ?10/17/2021 ? ?[name], identified by name and date of birth, is a 46 y.o. male with a diagnosis of Diabetes:  .  ? ?ASSESSMENT ?Pt A1c has dropped to 6.4 from 6.5 in the last 3 months. ?Pt total cholesterol and LDL cholesterol have decreased since December. Pt states that they feel that they can do better, pt wants to lower their A1c below 5.7. Pt states that their mother is struggling with diabetes and pt does not want to go through the same problems.  ?Pt states they have been eating less, and watching how much sugar they consume. Pt states chocolates have been their biggest sources of sugars that they have reduced. Pt reports skipping lunch at least twice a week, states that they don't have any lunch foods to eat at that time. Pt reports snacking mostly in the morning and afternoon while working. ?Pt was buying  and eating oranges, but their mother ate them before they got to finish. ?Pt reports walking around the house for activity, pt is interested in finding some parks close by to walk in.  ?Height '5\' 5"'$  (1.651 m), weight (!) 327 lb (148.3 kg). ?Body mass index is 54.42 kg/m?. ? ? Diabetes Self-Management Education - 10/17/21 1400   ? ?  ? Visit Information  ? Visit Type Follow-up   ?  ? Dietary Intake  ? Breakfast Sausage and bacon croissant   ? Snack (morning) pecans   ? Snack (afternoon) pecans   ? Dinner Steak and potatoes   ? Beverage(s) Diet soda   ?  ? Exercise  ? Exercise Type ADL's   ? How many days per week to you exercise? 0   ? How many minutes per day do you exercise? 0   ? Total minutes per week of exercise 0   ?  ? Patient Education  ? Nutrition management  Meal options for control of blood glucose level and chronic complications.   ? Physical activity and exercise  Role of exercise on diabetes management, blood pressure control and cardiac health.;Helped patient identify  appropriate exercises in relation to his/her diabetes, diabetes complications and other health issue.   ?  ? Individualized Goals (developed by patient)  ? Nutrition Follow meal plan discussed   ? Physical Activity Exercise 3-5 times per week   ?  ? Patient Self-Evaluation of Goals - Patient rates self as meeting previously set goals (% of time)  ? Nutrition < 25%   ? Physical Activity < 25%   ? Medications >75%   ? Monitoring Not Applicable   ? Problem Solving < 25%   ? Reducing Risk 25 - 50%   Improved A1c and lipid panel  ? Health Coping < 25%   ?  ? Post-Education Assessment  ? Patient understands the diabetes disease and treatment process. Needs Review   ? Patient understands incorporating nutritional management into lifestyle. Needs Review   ? Patient undertands incorporating physical activity into lifestyle. Needs Review   ? Patient understands using medications safely. Needs Review   ? Patient understands monitoring blood glucose, interpreting and using results Needs Review   ? Patient understands prevention, detection, and treatment of acute complications. Needs Review   ? Patient understands prevention, detection, and treatment of chronic complications. Needs Review   ? Patient understands how to develop strategies to address psychosocial issues. Needs Review   ? Patient understands  how to develop strategies to promote health/change behavior. Needs Review   ?  ? Outcomes  ? Expected Outcomes Demonstrated limited interest in learning.  Expect minimal changes   ? Future DMSE 2 months   ? Program Status Not Completed   ?  ? Subsequent Visit  ? Since your last visit have you continued or begun to take your medications as prescribed? Yes   ? Since your last visit have you had your blood pressure checked? Yes   ? Is your most recent blood pressure lower, unchanged, or higher since your last visit? Unchanged   ? Since your last visit have you experienced any weight changes? Gain   ? Weight Gain (lbs) 7   ? Since  your last visit, are you checking your blood glucose at least once a day? N/A   ? ?  ?  ? ?  ? ? ?Individualized Plan for Diabetes Self-Management Training:  ? ?Learning Objective:  Patient will have a greater understanding of diabetes self-management. ?Patient education plan is to attend individual and/or group sessions per assessed needs and concerns. ?  ?Plan:  ? ?Patient Instructions  ?Take a look at walking in [place] on [address]. There are bus stops right at the walking paths. Take a short walk around your apartment complex after lunch time. ? ?Work on having a sandwich on whole wheat bread with lettuce, low-fat deli meat, and baked chips on the side. ? ?Try a few different seltzer waters to drink. Make sure the label says "0 Calories". ? ?Get yourself a smaller bowl (8 -10 oz) for your snacks. Mix your pecans or cashews with popcorn for your snacks. ? ? ? ? ?Expected Outcomes:  Demonstrated limited interest in learning.  Expect minimal changes ? ?Education material provided: Carbohydrate counting sheet ? ?If problems or questions, patient to contact team via:  Phone and Email ? ?Future DSME appointment: 2 months ? ? ? ? ? ?

## 2021-10-25 ENCOUNTER — Telehealth: Payer: Self-pay | Admitting: Family Medicine

## 2021-10-25 DIAGNOSIS — R7989 Other specified abnormal findings of blood chemistry: Secondary | ICD-10-CM

## 2021-10-25 NOTE — Telephone Encounter (Signed)
Edward Carr is a 47 year old male with a medical history significant for type 2 diabetes mellitus, morbid obesity, hypertension, and hyperlipidemia that presented in primary care for follow-up of his chronic conditions. ?Reviewed all laboratory values, creatinine 1.4, which is elevated from previous.  We will repeat lab in 1 week.  Notify patient, he is aware of upcoming lab appointment. ? ? ?[name]  APRN, MSN, FNP-C ?Patient [facility] ?[facility] ?[address]  ?[place], [place] [address] ?[phone] ? ?

## 2021-11-01 ENCOUNTER — Other Ambulatory Visit: Payer: PRIVATE HEALTH INSURANCE

## 2021-11-01 DIAGNOSIS — R7989 Other specified abnormal findings of blood chemistry: Secondary | ICD-10-CM

## 2021-11-02 LAB — BASIC METABOLIC PANEL
BUN/Creatinine Ratio: 12 (ref 9–20)
BUN: 14 mg/dL (ref 6–24)
CO2: 25 mmol/L (ref 20–29)
Calcium: 10 mg/dL (ref 8.7–10.2)
Chloride: 97 mmol/L (ref 96–106)
Creatinine, Ser: 1.16 mg/dL (ref 0.76–1.27)
Glucose: 93 mg/dL (ref 70–99)
Potassium: 4.1 mmol/L (ref 3.5–5.2)
Sodium: 142 mmol/L (ref 134–144)
eGFR: 80 mL/min/{1.73_m2} (ref >59)

## 2021-11-21 NOTE — Progress Notes (Signed)
? ?Follow Up Note ? ?RE: Edward Carr MRN: [id number] DOB: [birth year] ?Date of Office Visit: 11/22/2021 ? ?Referring provider: Dorena Dew, FNP ?Primary care provider: Dorena Dew, FNP ? ?Chief Complaint: Allergic Rhinitis  (Great so far.) and Asthma (No issues at present.) ? ?History of Present Illness: ?I had the pleasure of seeing Edward Carr for a follow up visit at the [facility] on 11/22/2021. He is a 46 y.o. male, who is being followed for allergic rhinoconjunctivitis, adverse food reaction, asthma, rash, cough. His previous allergy office visit was on 07/31/2021 with Dr. [name]. Today is a regular follow up visit. ? ?Seasonal and perennial allergic rhinoconjunctivitis ?Currently not taking any antihistamines or nasal sprays or eye drops. ?Currently asymptomatic. ? ?Food ?Avoiding shellfish and mollusks. ?Did not pick up epinephrine due to cost.  ?No reactions. ?  ?Asthma ?Only using albuterol as needed about once per month with good benefit.  ?Denies any SOB, coughing, wheezing, chest tightness, nocturnal awakenings, ER/urgent care visits or prednisone use since the last visit. ?  ?Facial rash ?Did not see derm as it improved with the change of facial regimen.  ? ?Cough ?Coughing is the same. ?He did not try the azelastine or Xyzal regularly. ?Denies any reflux symptoms. ? ?Assessment and Plan: ?[name] is a 46 y.o. male with: ?Seasonal and perennial allergic rhinoconjunctivitis ?Past history - 2022 skin testing showed: Positive to grass, trees, ragweed, weed pollen, dust mites, cockroach. Borderline to cat and dog.  ?Interim history - asymptomatic with no medications. ?Continue environmental control measures.[place] ?Use Xyzal (levocetirizine) daily as needed. May take twice a day during allergy flares. May switch antihistamines every few months. ?Use azelastine nasal spray 1-2 sprays per nostril twice a day as needed for runny nose/drainage. ? ?Asthma ?Past history - asthma like  symptoms for 30+ years. Main triggers are exertion and panic attacks. Uses albuterol once a week with good benefit.  2022 spirometry showed restriction most likely due to body habitus. ?Interim history - only using albuterol 1 times per month. Still has coughing but did not take nasal spray or antihistamines consistently to see if it helps. Denies reflux symptoms.  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 Clinically feeling unchanged.  ?I question if patient is an underperceiver of his symptoms. ?Daily controller medication(s): START Breo 167mg 1 puff once a day and rinse mouth after each use.  ?If no improvement in coughing/breathing after 1 month then stop. ?If too expensive call our office. ?May use albuterol rescue inhaler 2 puffs every 4 to 6 hours as needed for shortness of breath, chest tightness, coughing, and wheezing. May use albuterol rescue inhaler 2 puffs 5 to 15 minutes prior to strenuous physical activities. Monitor frequency of use.  ?Get spirometry at next visit. ?If no improvement in coughing - get CXR and do a trial of PPI, and stop lisinopril.  ? ?Other adverse food reactions, not elsewhere classified, subsequent encounter ?Past history - delayed reaction to shrimp as a child in the form of right eye swelling. Resolved without any intervention. Then had another episode of eye swelling as an adult but not sure of shellfish exposure. Tolerates finned fish with no issues.  2022 skin testing showed: Positive to shellfish, shrimp, crab, lobster, oyster.  ?Interim history - did not pick up epinephrine due to cost.  ?Continue strict avoidance of shellfish and mollusks ?I have prescribed epinephrine injectable and demonstrated proper use. For  mild symptoms you can take over the counter antihistamines such as Benadryl and monitor symptoms closely. If symptoms worsen or if you have severe symptoms including breathing  issues, throat closure, significant swelling, whole body hives, severe diarrhea and vomiting, lightheadedness then inject epinephrine and seek immediate medical care afterwards. ?Let me know if the symjepi epinephrine is too expensive.  ?Food action plan in place.  ? ?Rash and other nonspecific skin eruption ?Past history - Persistent facial rash even prior to the COVID-19 pandemic and mask wearing. No triggers noted. No prior dermatology evaluation. ?Interim history - improved, no dermatology evaluation. ?Ask your PCP for dermatology referral if not improving.  ?Continue proper skin care.  ?Don't use washcloth or the [place] spring to wash your face. ?Use lukewarm water with your hands and a mild facial soap. ? ?Return in about 3 months (around 02/21/2022). ? ?Meds ordered this encounter  ?Medications  ? EPINEPHrine (SYMJEPI) 0.3 MG/0.3ML SOSY  ?  Sig: Inject 0.3 mLs (0.3 mg total) as directed as needed (for anaphylactic reaction).  ?  Dispense:  1 each  ?  Refill:  1  ?  Patient bringing coupon for this.  ? fluticasone furoate-vilanterol (BREO ELLIPTA) 100-25 MCG/ACT AEPB  ?  Sig: Inhale 1 puff into the lungs daily. Rinse mouth after each use.  ?  Dispense:  60 each  ?  Refill:  3  ? ?Lab Orders  ?No laboratory test(s) ordered today  ? ? ?Diagnostics: ?Spirometry:  ?Tracings reviewed. His effort: Good reproducible efforts. 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 Clinically feeling unchanged.  ? ?Please see scanned spirometry results for details. ? ?Medication List:  ?Current Outpatient Medications  ?Medication Sig Dispense Refill  ? albuterol (VENTOLIN HFA) 108 (90 Base) MCG/ACT inhaler Inhale 1 puff into the lungs every 6 (six) hours as needed for wheezing or shortness of breath. 8 g 1  ? amLODipine (NORVASC) 10 MG tablet Take 1 tablet (10 mg total) by  mouth daily. 90 tablet 1  ? atorvastatin (LIPITOR) 20 MG tablet Take 1 tablet (20 mg total) by mouth daily. 90 tablet 3  ? EPINEPHrine (SYMJEPI) 0.3 MG/0.3ML SOSY Inject 0.3 mLs (0.3 mg total) as directed as needed (for anaphylactic reaction). 1 each 1  ? fluticasone furoate-vilanterol (BREO ELLIPTA) 100-25 MCG/ACT AEPB Inhale 1 puff into the lungs daily. Rinse mouth after each use. 60 each 3  ? lisinopril-hydrochlorothiazide (ZESTORETIC) 20-25 MG tablet Take 1 tablet by mouth daily. 90 tablet 1  ? metFORMIN (GLUCOPHAGE) 500 MG tablet Take 1 tablet (500 mg total) by mouth 2 (two) times daily with a meal. 180 tablet 2  ? azelastine (ASTELIN) 0.1 % nasal spray Place 1-2 sprays into both nostrils 2 (two) times daily as needed (nasal drainage). Use in each nostril as directed (Patient not taking: Reported on 10/03/2021) 30 mL 5  ? levocetirizine (XYZAL) 5 MG tablet Take 1 tablet (5 mg total) by mouth every evening. (Patient not taking: Reported on 11/22/2021) 30 tablet 5  ? ?No current facility-administered medications for this visit.  ? ?Allergies: ?Allergies  ?Allergen Reactions  ? Crab Extract Allergy Skin Test   ? Shellfish Allergy   ? Shrimp Extract Allergy Skin Test   ? ?I reviewed his past medical history, social history, family history, and environmental history and no significant changes have been reported from his previous visit. ? ?Review of Systems  ?  Constitutional:  Negative for appetite change, chills, fever and unexpected weight change.  ?HENT:  Negative for congestion, postnasal drip and rhinorrhea.   ?Eyes:  Negative for itching.  ?Respiratory:  Positive for cough. Negative for chest tightness, shortness of breath and wheezing.   ?Cardiovascular:  Negative for chest pain.  ?Gastrointestinal:  Negative for abdominal pain.  ?Genitourinary:  Negative for difficulty urinating.  ?Skin:  Positive for rash.  ?Allergic/Immunologic: Positive for environmental allergies and food allergies.  ?Neurological:  Negative  for headaches.  ? ?Objective: ?BP 128/90 (Cuff Size: Large)   Pulse 84   Temp 98.2 ?F (36.8 ?C) (Temporal)   Resp 16   Ht '5\' 5"'$  (1.651 m)   Wt (!) 322 lb (146.1 kg)   SpO2 96%   BMI 53.58 kg/m?  ?Body mas

## 2021-11-22 ENCOUNTER — Ambulatory Visit (INDEPENDENT_AMBULATORY_CARE_PROVIDER_SITE_OTHER): Payer: No Typology Code available for payment source | Admitting: Allergy

## 2021-11-22 ENCOUNTER — Encounter: Payer: Self-pay | Admitting: Allergy

## 2021-11-22 VITALS — BP 128/90 | HR 84 | Temp 98.2°F | Resp 16 | Ht 65.0 in | Wt 322.0 lb

## 2021-11-22 DIAGNOSIS — J302 Other seasonal allergic rhinitis: Secondary | ICD-10-CM

## 2021-11-22 DIAGNOSIS — R21 Rash and other nonspecific skin eruption: Secondary | ICD-10-CM

## 2021-11-22 DIAGNOSIS — J3089 Other allergic rhinitis: Secondary | ICD-10-CM

## 2021-11-22 DIAGNOSIS — T781XXD Other adverse food reactions, not elsewhere classified, subsequent encounter: Secondary | ICD-10-CM

## 2021-11-22 DIAGNOSIS — H1013 Acute atopic conjunctivitis, bilateral: Secondary | ICD-10-CM

## 2021-11-22 DIAGNOSIS — J4520 Mild intermittent asthma, uncomplicated: Secondary | ICD-10-CM

## 2021-11-22 DIAGNOSIS — H1010 Acute atopic conjunctivitis, unspecified eye: Secondary | ICD-10-CM

## 2021-11-22 MED ORDER — SYMJEPI 0.3 MG/0.3ML IJ SOSY: 1.0000 | PREFILLED_SYRINGE | INTRAMUSCULAR | 1 refills | Status: DC | PRN

## 2021-11-22 MED ORDER — FLUTICASONE FUROATE-VILANTEROL 100-25 MCG/ACT IN AEPB: 1.0000 | INHALATION_SPRAY | Freq: Every day | RESPIRATORY_TRACT | 3 refills | Status: DC

## 2021-11-22 NOTE — Assessment & Plan Note (Addendum)
Past history - asthma like symptoms for 30+ years. Main triggers are exertion and panic attacks. Uses albuterol once a week with good benefit.  2022 spirometry showed restriction most likely due to body habitus. ?Interim history - only using albuterol 1 times per month. Still has coughing but did not take nasal spray or antihistamines consistently to see if it helps. Denies reflux symptoms.  ?? 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 Clinically feeling unchanged.  ?? I question if patient is an underperceiver of his symptoms. ?? Daily controller medication(s): START Breo 143mg 1 puff once a day and rinse mouth after each use.  ?? If no improvement in coughing/breathing after 1 month then stop. ?? If too expensive call our office. ?? May use albuterol rescue inhaler 2 puffs every 4 to 6 hours as needed for shortness of breath, chest tightness, coughing, and wheezing. May use albuterol rescue inhaler 2 puffs 5 to 15 minutes prior to strenuous physical activities. Monitor frequency of use.  ?? Get spirometry at next visit. ?? If no improvement in coughing - get CXR and do a trial of PPI, and stop lisinopril.  ?

## 2021-11-22 NOTE — Assessment & Plan Note (Signed)
Past history - Persistent facial rash even prior to the COVID-19 pandemic and mask wearing. No triggers noted. No prior dermatology evaluation. ?Interim history - improved, no dermatology evaluation. ?? Ask your PCP for dermatology referral if not improving.  ?? Continue proper skin care.  ?? Don't use washcloth or the [place] spring to wash your face. ?? Use lukewarm water with your hands and a mild facial soap. ?

## 2021-11-22 NOTE — Assessment & Plan Note (Signed)
Past history - delayed reaction to shrimp as a child in the form of right eye swelling. Resolved without any intervention. Then had another episode of eye swelling as an adult but not sure of shellfish exposure. Tolerates finned fish with no issues.  2022 skin testing showed: Positive to shellfish, shrimp, crab, lobster, oyster.  ?Interim history - did not pick up epinephrine due to cost.  ?? Continue strict avoidance of shellfish and mollusks ?? I have prescribed epinephrine injectable and demonstrated proper use. For mild symptoms you can take over the counter antihistamines such as Benadryl and monitor symptoms closely. If symptoms worsen or if you have severe symptoms including breathing issues, throat closure, significant swelling, whole body hives, severe diarrhea and vomiting, lightheadedness then inject epinephrine and seek immediate medical care afterwards. ?? Let me know if the symjepi epinephrine is too expensive.  ?? Food action plan in place.  ?

## 2021-11-22 NOTE — Assessment & Plan Note (Signed)
Past history - 2022 skin testing showed: Positive to grass, trees, ragweed, weed pollen, dust mites, cockroach. Borderline to cat and dog.  ?Interim history - asymptomatic with no medications. ?? Continue environmental control measures.Marland Kitchen ?? Use Xyzal (levocetirizine) daily as needed. May take twice a day during allergy flares. May switch antihistamines every few months. ?? Use azelastine nasal spray 1-2 sprays per nostril twice a day as needed for runny nose/drainage. ?

## 2021-11-22 NOTE — Patient Instructions (Addendum)
Environmental allergies: ?2022 skin testing showed: Positive to grass, trees, ragweed, weed pollen, dust mites, cockroach. Borderline to cat and dog.  ?Continue environmental control measures. ?Use Xyzal (levocetirizine) daily as needed. May take twice a day during allergy flares. May switch antihistamines every few months. ?Use azelastine nasal spray 1-2 sprays per nostril twice a day as needed for runny nose/drainage. ? ?Food allergy: ?Continue strict avoidance of shellfish and mollusks ?I have prescribed epinephrine injectable and demonstrated proper use. For mild symptoms you can take over the counter antihistamines such as Benadryl and monitor symptoms closely. If symptoms worsen or if you have severe symptoms including breathing issues, throat closure, significant swelling, whole body hives, severe diarrhea and vomiting, lightheadedness then inject epinephrine and seek immediate medical care afterwards. ?Let me know if the symjepi epinephrine is too expensive.  ?Food action plan in place.  ? ?Asthma:  ?Daily controller medication(s): START Breo 136mg 1 puff once a day and rinse mouth after each use.  ?If no improvement in coughing/breathing after 1 month then stop. ?If too expensive call our office. ?May use albuterol rescue inhaler 2 puffs every 4 to 6 hours as needed for shortness of breath, chest tightness, coughing, and wheezing. May use albuterol rescue inhaler 2 puffs 5 to 15 minutes prior to strenuous physical activities. Monitor frequency of use.  ?Asthma control goals:  ?Full participation in all desired activities (may need albuterol before activity) ?Albuterol use two times or less a week on average (not counting use with activity) ?Cough interfering with sleep two times or less a month ?Oral steroids no more than once a year ?No hospitalizations  ?Skin: ?Ask your PCP for dermatology referral if not improving.  ?Continue proper skin care.  ?Don't use washcloth or the ir[place] spring to wash your  face. ?Use lukewarm water with your hands and a mild facial soap. ? ?Follow up in 3 months or sooner if needed.  ? ?Skin care recommendations ? ?Bath time: ?Always use lukewarm water. AVOID very hot or cold water. ?Keep bathing time to 5-10 minutes. ?Do NOT use bubble bath. ?Use a mild soap and use just enough to wash the dirty areas. ?Do NOT scrub skin vigorously.  ?After bathing, pat dry your skin with a towel. Do NOT rub or scrub the skin. ? ?Moisturizers and prescriptions:  ?ALWAYS apply moisturizers immediately after bathing (within 3 minutes). This helps to lock-in moisture. ?Use the moisturizer several times a day over the whole body. ?Good summer moisturizers include: Aveeno, CeraVe, Cetaphil. ?Good winter moisturizers include: Aquaphor, Vaseline, Cerave, Cetaphil, Eucerin, Vanicream. ?When using moisturizers along with medications, the moisturizer should be applied about one hour after applying the medication to prevent diluting effect of the medication or moisturize around where you applied the medications. When not using medications, the moisturizer can be continued twice daily as maintenance. ? ?Laundry and clothing: ?Avoid laundry products with added color or perfumes. ?Use unscented hypo-allergenic laundry products such as Tide free, Cheer free & gentle, and All free and clear.  ?If the skin still seems dry or sensitive, you can try double-rinsing the clothes. ?Avoid tight or scratchy clothing such as wool. ?Do not use fabric softeners or dyer sheets. ? ? ? ?

## 2021-11-24 ENCOUNTER — Telehealth: Payer: Self-pay

## 2021-11-24 NOTE — Telephone Encounter (Signed)
Edward Carr called to let us know that the Epi pen Symjepi is on back order and wanted to know if an alternative could be used instead. Advised pharmacist to use whatever product name the patient's insurance will cover. Pharmacist stated that there wasn't any insurance on file for him. Researched the patient's chart and found that the patient uses a [place]. This card is not recognized at the [facility] per pharmacist and the out-of-pocket cost is over $200. Pharmacist placed a generic epi pen on file for patient in case he wants to pick it up in the future. ?

## 2021-11-26 NOTE — Telephone Encounter (Signed)
Noted  

## 2021-12-19 ENCOUNTER — Ambulatory Visit: Payer: PRIVATE HEALTH INSURANCE | Admitting: Dietician

## 2021-12-26 ENCOUNTER — Encounter: Payer: Self-pay | Attending: Family Medicine | Admitting: Skilled Nursing Facility1

## 2021-12-26 ENCOUNTER — Encounter: Payer: Self-pay | Admitting: Skilled Nursing Facility1

## 2021-12-26 DIAGNOSIS — E119 Type 2 diabetes mellitus without complications: Secondary | ICD-10-CM | POA: Insufficient documentation

## 2021-12-26 NOTE — Progress Notes (Signed)
ASSESSMENTDiabetes Self-Management Education  Visit Type: Follow-up   12/26/2021  [name], identified by name and date of birth, is a 46 y.o. male with a diagnosis of Diabetes:  .   ASSESSMENT  Height '5\' 5"'$  (1.651 m), weight (!) 325 lb 4.8 oz (147.6 kg). Body mass index is 54.13 kg/m.   Pt states he has been working on carbs and watching them but recently in the last week it has been tough but feels it is getting better. Pt states he is aiming for 60 grams per meal or per day is not sure of carbohydrates. Pt states he feels sometimes he eats larger portions of such things as cereal but he does try to choose ones with less sugar stating he is a cereal fan.  Pt states his health is good but feels it could be better stating he wants to reverse his DM.  Pt states he does not check his blood sugars stating he does not like sharp objects near his fingers.  Current A1C 6.4 stating he has a goal of getting it under 6.0%.  Pt states he needs to balance his carb and watch what he eats and exercise in order to reach the goal of A1C below 6.0%.   Pt states he lives with his mother whom also has diabetes and is trying to control her sugars.   Pt states sometimes he experiences blurry vision about once a month.    Pt states he has not made an appt for the dentist or the eye doctor due to finances. Pt states he started brushing his teeth 2 times a day due to seeing his sister have all her teeth removed.   Pt states in the last month his feet have been swollen as well as feeling stiff.   Pt states he eats whole wheat bread. Pt states he is aiming for 4 bottles of water per day hitting about 2.  Pt states sometimes he will eat after having gone to bed.   Pt states he is currently looking for a job.   Pt states he does enjoy walking but is worried about dogs.    Goals: Floss daily  Check your feet every day; be sure to talk to your doctor about your swollen feet at your upcoming  appt Do a walking video every day  Get non starchy vegetables to snack on instead of chips  DM Prescription: Metformin    Diabetes Self-Management Education - 12/26/21 1416       Visit Information   Visit Type Follow-up      Health Coping   How would you rate your overall health? Good      Psychosocial Assessment   Patient Belief/Attitude about Diabetes Motivated to manage diabetes    What is the hardest part about your diabetes right now, causing you the most concern, or is the most worrisome to you about your diabetes?   Making healty food and beverage choices;Being active    Self-care barriers None    Self-management support Family    Patient Concerns Nutrition/Meal planning;Healthy Lifestyle    Special Needs None    Preferred Learning Style Visual;Auditory    Learning Readiness Change in progress    How often do you need to have someone help you when you read instructions, pamphlets, or other written materials from your doctor or pharmacy? 1 - Never      Pre-Education Assessment   Patient understands the diabetes disease and treatment process. Demonstrates understanding /  competency    Patient understands incorporating nutritional management into lifestyle. Demonstrates understanding / competency    Patient undertands incorporating physical activity into lifestyle. Demonstrates understanding / competency    Patient understands using medications safely. Demonstrates understanding / competency    Patient understands monitoring blood glucose, interpreting and using results Demonstrates understanding / competency    Patient understands prevention, detection, and treatment of acute complications. Demonstrates understanding / competency    Patient understands prevention, detection, and treatment of chronic complications. Demonstrates understanding / competency    Patient understands how to develop strategies to address psychosocial issues. Demonstrates understanding / competency     Patient understands how to develop strategies to promote health/change behavior. Demonstrates understanding / competency      Complications   Last HgB A1C per patient/outside source 6.4 %    How often do you check your blood sugar? 0 times/day (not testing)    Have you had a dilated eye exam in the past 12 months? No    Have you had a dental exam in the past 12 months? No    Are you checking your feet? Yes    How many days per week are you checking your feet? 7      Dietary Intake   Breakfast [id number] 2 slices bacon and 2 toast or 1 bagel or 1 cressoints 2-3 sausage patties    Lunch sandwich or crackers or chips    Dinner fried chicken + french fries or rice or pasta or steak + baked potato + broccoli + cheese    Snack (evening) cereal    Beverage(s) diet soda, ornage juice, lemonade, water      Activity / Exercise   Activity / Exercise Type ADL's    How many days per week do you exercise? 0    How many minutes per day do you exercise? 0    Total minutes per week of exercise 0      Patient Education   Previous Diabetes Education Yes (please comment)    Disease Pathophysiology Factors that contribute to the development of diabetes    Healthy Eating Role of diet in the treatment of diabetes and the relationship between the three main macronutrients and blood glucose level;Carbohydrate counting    Being Active Role of exercise on diabetes management, blood pressure control and cardiac health.;Helped patient identify appropriate exercises in relation to his/her diabetes, diabetes complications and other health issue.    Medications Reviewed patients medication for diabetes, action, purpose, timing of dose and side effects.    Monitoring Purpose and frequency of SMBG.    Acute complications Taught prevention, symptoms, and  treatment of hypoglycemia - the 15 rule.;Discussed and identified patients' prevention, symptoms, and treatment of hyperglycemia.    Chronic complications Dental  care;Retinopathy and reason for yearly dilated eye exams;Assessed and discussed foot care and prevention of foot problems    Diabetes Stress and Support Worked with patient to identify barriers to care and solutions      Individualized Goals (developed by patient)   Nutrition General guidelines for healthy choices and portions discussed;Follow meal plan discussed    Physical Activity Exercise 5-7 days per week;15 minutes per day    Medications take my medication as prescribed    Problem Solving Eating Pattern;Addressing barriers to behavior change    Reducing Risk do foot checks daily;treat hypoglycemia with 15 grams of carbs if blood glucose less than '70mg'$ /dL    Health Coping Discuss barriers to diabetes care with support  person/system (comment specifics as needed)      Patient Self-Evaluation of Goals - Patient rates self as meeting previously set goals (% of time)   Nutrition 50 - 75 % (half of the time)    Physical Activity < 25% (hardly ever/never)    Medications >75% (most of the time)    Monitoring < 25% (hardly ever/never)    Problem Solving and behavior change strategies  50 - 75 % (half of the time)    Reducing Risk (treating acute and chronic complications) 50 - 75 % (half of the time)    Health Coping 50 - 75 % (half of the time)      Post-Education Assessment   Patient understands the diabetes disease and treatment process. Demonstrates understanding / competency    Patient understands incorporating nutritional management into lifestyle. Demonstrates understanding / competency    Patient undertands incorporating physical activity into lifestyle. Demonstrates understanding / competency    Patient understands using medications safely. Demonstrates understanding / competency    Patient understands monitoring blood glucose, interpreting and using results Demonstrates understanding / competency    Patient understands prevention, detection, and treatment of acute complications.  Demonstrates understanding / competency    Patient understands prevention, detection, and treatment of chronic complications. Demonstrates understanding / competency    Patient understands how to develop strategies to address psychosocial issues. Demonstrates understanding / competency    Patient understands how to develop strategies to promote health/change behavior. Demonstrates understanding / competency      Outcomes   Expected Outcomes Demonstrated interest in learning but significant barriers to change    Future DMSE 3-4 months    Program Status Completed      Subsequent Visit   Since your last visit have you continued or begun to take your medications as prescribed? No    Since your last visit have you had your blood pressure checked? Yes    Is your most recent blood pressure lower, unchanged, or higher since your last visit? Lower    Since your last visit have you experienced any weight changes? Gain    Weight Gain (lbs) 3    Since your last visit, are you checking your blood glucose at least once a day? No             Individualized Plan for Diabetes Self-Management Training:   Learning Objective:  Patient will have a greater understanding of diabetes self-management. Patient education plan is to attend individual and/or group sessions per assessed needs and concerns.    Expected Outcomes:  Demonstrated interest in learning but significant barriers to change  Education material provided: Snack sheet and Carbohydrate counting sheet  If problems or questions, patient to contact team via:  Phone and Email  Future DSME appointment: 3-4 months

## 2022-01-04 ENCOUNTER — Telehealth: Payer: Self-pay

## 2022-01-04 ENCOUNTER — Other Ambulatory Visit: Payer: Self-pay | Admitting: Family Medicine

## 2022-01-04 DIAGNOSIS — E119 Type 2 diabetes mellitus without complications: Secondary | ICD-10-CM

## 2022-01-09 ENCOUNTER — Encounter: Payer: Self-pay | Admitting: Family Medicine

## 2022-01-09 ENCOUNTER — Ambulatory Visit (INDEPENDENT_AMBULATORY_CARE_PROVIDER_SITE_OTHER): Payer: PRIVATE HEALTH INSURANCE | Admitting: Family Medicine

## 2022-01-09 ENCOUNTER — Other Ambulatory Visit: Payer: Self-pay

## 2022-01-09 VITALS — BP 122/72 | HR 76 | Temp 98.1°F | Ht 65.0 in | Wt 316.6 lb

## 2022-01-09 DIAGNOSIS — E119 Type 2 diabetes mellitus without complications: Secondary | ICD-10-CM

## 2022-01-09 DIAGNOSIS — L308 Other specified dermatitis: Secondary | ICD-10-CM

## 2022-01-09 DIAGNOSIS — E785 Hyperlipidemia, unspecified: Secondary | ICD-10-CM

## 2022-01-09 DIAGNOSIS — I10 Essential (primary) hypertension: Secondary | ICD-10-CM

## 2022-01-09 LAB — POCT GLYCOSYLATED HEMOGLOBIN (HGB A1C)
HbA1c POC (<> result, manual entry): 6.1 % (ref 4.0–5.6)
HbA1c, POC (controlled diabetic range): 6.1 % (ref 0.0–7.0)
HbA1c, POC (prediabetic range): 6.1 % (ref 5.7–6.4)
Hemoglobin A1C: 6.1 % — AB (ref 4.0–5.6)

## 2022-01-09 MED ORDER — TRULICITY 0.75 MG/0.5ML ~~LOC~~ SOAJ
0.7500 mg | SUBCUTANEOUS | 5 refills | Status: DC
  Filled 2022-01-09 (×2): qty 0.5, 7d supply, fill #0

## 2022-01-09 MED ORDER — TRIAMCINOLONE ACETONIDE 0.5 % EX OINT: 1.0000 "application " | TOPICAL_OINTMENT | Freq: Two times a day (BID) | CUTANEOUS | 0 refills | Status: DC

## 2022-01-09 MED ORDER — AMLODIPINE BESYLATE 10 MG PO TABS: 5.0000 mg | ORAL_TABLET | Freq: Every day | ORAL | 1 refills | Status: DC

## 2022-01-09 NOTE — Telephone Encounter (Signed)
Noted, thanks!

## 2022-01-09 NOTE — Patient Instructions (Signed)
Dulaglutide Injection What is this medication? DULAGLUTIDE (DOO la GLOO tide) treats type 2 diabetes. It works by increasing insulin levels in your body, which decreases your blood sugar (glucose). It also reduces the amount of sugar released into your blood and slows down your digestion. It can also be used to lower the risk of heart attack and stroke in people with type 2 diabetes. Changes to diet and exercise are often combined with this medication. This medicine may be used for other purposes; ask your health care provider or pharmacist if you have questions. COMMON BRAND NAME(S): Trulicity What should I tell my care team before I take this medication? They need to know if you have any of these conditions: Endocrine tumors (MEN 2) or if someone in your family had these tumors Eye disease, vision problems History of pancreatitis Kidney disease Liver disease Stomach or intestine problems Thyroid cancer or if someone in your family had thyroid cancer An unusual or allergic reaction to dulaglutide, other medications, foods, dyes, or preservatives Pregnant or trying to get pregnant Breast-feeding How should I use this medication? This medication is injected under the skin. You will be taught how to prepare and give it. Take it as directed on the prescription label on the same day of each week. Do NOT prime the pen. Keep taking it unless your care team tells you to stop. If you use this medication with insulin, you should inject this medication and the insulin separately. Do not mix them together. Do not give the injections right next to each other. Change (rotate) injection sites with each injection. This medication comes with INSTRUCTIONS FOR USE. Ask your pharmacist for directions on how to use this medication. Read the information carefully. Talk to your pharmacist or care team if you have questions. It is important that you put your used needles and syringes in a special sharps container. Do  not put them in a trash can. If you do not have a sharps container, call your pharmacist or care team to get one. A special MedGuide will be given to you by the pharmacist with each prescription and refill. Be sure to read this information carefully each time. Talk to your care team about the use of this medication in children. While it may be prescribed for children as young as 10 years for selected conditions, precautions do apply. Overdosage: If you think you have taken too much of this medicine contact a poison control center or emergency room at once. NOTE: This medicine is only for you. Do not share this medicine with others. What if I miss a dose? If you miss a dose, take it as soon as you can unless it is more than 3 days late. If it is more than 3 days late, skip the missed dose. Take the next dose at the normal time. What may interact with this medication? Other medications for diabetes Many medications may cause changes in blood sugar, these include: Alcohol containing beverages Antiviral medications for HIV or AIDS Aspirin and aspirin-like medications Certain medications for blood pressure, heart disease, irregular heart beat Chromium Diuretics Male hormones, such as estrogens or progestins, birth control pills Fenofibrate Gemfibrozil Isoniazid Lanreotide Male hormones or anabolic steroids MAOIs like Carbex, Eldepryl, Marplan, Nardil, and Parnate Medications for allergies, asthma, cold, or cough Medications for depression, anxiety, or psychotic disturbances Medications for weight loss Niacin Nicotine NSAIDs, medications for pain and inflammation, like ibuprofen or naproxen Octreotide Pasireotide Pentamidine Phenytoin Probenecid Quinolone antibiotics such as ciprofloxacin, levofloxacin,  ofloxacin Some herbal dietary supplements Steroid medications such as prednisone or cortisone Sulfamethoxazole; trimethoprim Thyroid hormones Some medications can hide the warning  symptoms of low blood sugar (hypoglycemia). You may need to monitor your blood sugar more closely if you are taking one of these medications. These include: Beta-blockers, often used for high blood pressure or heart problems (examples include atenolol, metoprolol, propranolol) Clonidine Guanethidine Reserpine This list may not describe all possible interactions. Give your health care provider a list of all the medicines, herbs, non-prescription drugs, or dietary supplements you use. Also tell them if you smoke, drink alcohol, or use illegal drugs. Some items may interact with your medicine. What should I watch for while using this medication? Visit your care team for regular checks on your progress. Check with your care team if you have severe diarrhea, nausea, and vomiting, or if you sweat a lot. The loss of too much body fluid may make it dangerous for you to take this medication. A test called the HbA1C (A1C) will be monitored. This is a simple blood test. It measures your blood sugar control over the last 2 to 3 months. You will receive this test every 3 to 6 months. Learn how to check your blood sugar. Learn the symptoms of low and high blood sugar and how to manage them. Always carry a quick-source of sugar with you in case you have symptoms of low blood sugar. Examples include hard sugar candy or glucose tablets. Make sure others know that you can choke if you eat or drink when you develop serious symptoms of low blood sugar, such as seizures or unconsciousness. Get medical help at once. Tell your care team if you have high blood sugar. You might need to change the dose of your medication. If you are sick or exercising more than usual, you may need to change the dose of your medication. Do not skip meals. Ask your care team if you should avoid alcohol. Many nonprescription cough and cold products contain sugar or alcohol. These can affect blood sugar. Pens should never be shared. Even if the  needle is changed, sharing may result in passing of viruses like hepatitis or HIV. Wear a medical ID bracelet or chain. Carry a card that describes your condition. List the medications and doses you take on the card. What side effects may I notice from receiving this medication? Side effects that you should report to your care team as soon as possible: Allergic reactions--skin rash, itching, hives, swelling of the face, lips, tongue, or throat Change in vision Dehydration--increased thirst, dry mouth, feeling faint or lightheaded, headache, dark yellow or brown urine Kidney injury--decrease in the amount of urine, swelling of the ankles, hands, or feet Pancreatitis--severe stomach pain that spreads to your back or gets worse after eating or when touched, fever, nausea, vomiting Thyroid cancer--new mass or lump in the neck, pain or trouble swallowing, trouble breathing, hoarseness Side effects that usually do not require medical attention (report to your care team if they continue or are bothersome): Diarrhea Loss of appetite Nausea Stomach pain Vomiting This list may not describe all possible side effects. Call your doctor for medical advice about side effects. You may report side effects to FDA at 1-800-FDA-1088. Where should I keep my medication? Keep out of the reach of children and pets. Refrigeration (preferred): Store unopened pens in a refrigerator between 2 and 8 degrees C (36 and 46 degrees F). Keep it in the original carton until you are ready to   take it. Do not freeze or use if the medication has been frozen. Protect from light. Get rid of any unused medication after the expiration date on the label. Room Temperature: The pen may be stored at room temperature below 30 degrees C (86 degrees F) for up to a total of 14 days if needed. Protect from light. Avoid exposure to extreme heat. If it is stored at room temperature, throw away any unused medication after 14 days or after it expires,  whichever is first. To get rid of medications that are no longer needed or have expired: Take the medication to a medication take-back program. Check with your pharmacy or law enforcement to find a location. If you cannot return the medication, ask your pharmacist or care team how to get rid of this medication safely. NOTE: This sheet is a summary. It may not cover all possible information. If you have questions about this medicine, talk to your doctor, pharmacist, or health care provider.  2023 Elsevier/Gold Standard (2021-06-20 00:00:00)

## 2022-01-09 NOTE — Progress Notes (Signed)
Patient [facility]   Established Patient Office Visit  Subjective   Patient ID: Edward Carr, male    DOB: 1975-12-23  Age: 46 y.o. MRN: [id number]  Chief Complaint  Patient presents with   Follow-up    3 month follow up;diabetes Patient states that he spoke to his nutritionist about his legs swelling and hardness with no pains or stiffness. Patient states that he thinks he may need to decrease his salt intake but not sure if that is the case for his legs swelling.    Edward Carr is a 46 year old male with a medical history significant for type 2 diabetes mellitus, hypertension, hyperlipidemia, eczema and morbid obesity presents for follow-up of chronic conditions.  Patient says that he has been doing well and does not have any complaints today.  He has not been following a carbohydrate modified diet or exercising.  Patient was previously sent to diabetes and nutrition and has a diet plan, but has not been following it consistently.  Patient does not exercise.  He does not check blood glucose at home.  His most recent hemoglobin A1c was 6.4, which is consistent with his baseline.  Patient remains at goal with diabetes.  Diabetes He presents for his follow-up diabetic visit. He has type 2 diabetes mellitus. His disease course has been stable. Pertinent negatives for hypoglycemia include no sweats. Pertinent negatives for diabetes include no blurred vision, no chest pain, no foot paresthesias, no foot ulcerations, no polydipsia, no polyphagia, no polyuria and no weight loss. Symptoms are stable.  Hypertension This is a chronic problem. The problem is controlled. Pertinent negatives include no anxiety, blurred vision, chest pain, neck pain, orthopnea, palpitations, PND, shortness of breath or sweats. Risk factors for coronary artery disease include male gender, diabetes mellitus, sedentary lifestyle and obesity. Compliance problems include exercise and diet.   There is no history of kidney disease, CAD/MI or heart failure.    Patient Active Problem List   Diagnosis Date Noted   Seasonal and perennial allergic rhinoconjunctivitis 03/20/2021   Morbid obesity with BMI of 50.0-59.9, adult ([place]) 12/13/2020   Other adverse food reactions, not elsewhere classified, subsequent encounter 11/14/2020   Rash and other nonspecific skin eruption 11/14/2020   Controlled type 2 diabetes mellitus without complication ([place]) 54/65/6812   Influenza vaccination given 05/22/2017   Essential hypertension 08/22/2016   Asthma 08/22/2016   Eczema 08/22/2016   Past Medical History:  Diagnosis Date   Asthma    Diabetes mellitus without complication ([place])    Hypertension    Past Surgical History:  Procedure Laterality Date   DENTAL SURGERY     Social History   Tobacco Use   Smoking status: Never   Smokeless tobacco: Never  Vaping Use   Vaping Use: Never used  Substance Use Topics   Alcohol use: No   Drug use: No   Social History   Socioeconomic History   Marital status: Single    Spouse name: Not on file   Number of children: Not on file   Years of education: Not on file   Highest education level: Not on file  Occupational History   Not on file  Tobacco Use   Smoking status: Never   Smokeless tobacco: Never  Vaping Use   Vaping Use: Never used  Substance and Sexual Activity   Alcohol use: No   Drug use: No   Sexual activity: Never  Other Topics Concern   Not on file  Social History Narrative   Not on file   Social Determinants of Health   Financial Resource Strain: Not on file  Food Insecurity: Not on file  Transportation Needs: Not on file  Physical Activity: Not on file  Stress: Not on file  Social Connections: Not on file  Intimate Partner Violence: Not on file   Family Status  Relation Name Status   Mother  Alive   Father  Deceased   Sister  Alive   Family History  Problem Relation Age of Onset   Diabetes Mother     Cancer Father    Cancer Sister    Asthma Sister    Allergies  Allergen Reactions   Crab Extract Allergy Skin Test    Shellfish Allergy    Shrimp Extract Allergy Skin Test       Review of Systems  Constitutional:  Negative for weight loss.  Eyes:  Negative for blurred vision.  Respiratory:  Negative for shortness of breath.   Cardiovascular:  Negative for chest pain, palpitations, orthopnea and PND.  Musculoskeletal:  Negative for neck pain.  Skin:  Positive for rash.  Endo/Heme/Allergies:  Negative for polydipsia and polyphagia.      Objective:     BP 122/72   Pulse 76   Temp 98.1 F (36.7 C)   Ht _0  (1.651 m)   Wt (!) 316 lb 9.6 oz (143.6 kg)   SpO2 98%   BMI 52.68 kg/m  BP Readings from Last 3 Encounters:  01/09/22 122/72  11/22/21 128/90  10/03/21 125/78   Wt Readings from Last 3 Encounters:  01/09/22 (!) 316 lb 9.6 oz (143.6 kg)  12/26/21 (!) 325 lb 4.8 oz (147.6 kg)  11/22/21 (!) 322 lb (146.1 kg)      Physical Exam Constitutional:      Appearance: Normal appearance. He is obese.  Cardiovascular:     Rate and Rhythm: Normal rate and regular rhythm.     Pulses: Normal pulses.  Abdominal:     General: Bowel sounds are normal.     Palpations: Abdomen is soft.  Skin:    General: Skin is warm.     Findings: Rash (Has a history of eczema, generalized rash) present.  Neurological:     General: No focal deficit present.     Mental Status: He is alert. Mental status is at baseline.  Psychiatric:        Mood and Affect: Mood normal.        Behavior: Behavior normal.        Thought Content: Thought content normal.      Results for orders placed or performed in visit on 94/76/54  Basic Metabolic Panel  Result Value Ref Range   Glucose 98 70 - 99 mg/dL   BUN 15 6 - 24 mg/dL   Creatinine, Ser 1.16 0.76 - 1.27 mg/dL   eGFR 80 >59 mL/min/1.73   BUN/Creatinine Ratio 13 9 - 20   Sodium 136 134 - 144 mmol/L   Potassium 3.9 3.5 - 5.2 mmol/L    Chloride 96 96 - 106 mmol/L   CO2 23 20 - 29 mmol/L   Calcium 9.6 8.7 - 10.2 mg/dL  HgB A1c  Result Value Ref Range   Hemoglobin A1C 6.1 (A) 4.0 - 5.6 %   HbA1c POC (<> result, manual entry) 6.1 4.0 - 5.6 %   HbA1c, POC (prediabetic range) 6.1 5.7 - 6.4 %   HbA1c, POC (controlled diabetic range) 6.1 0.0 - 7.0 %  Last CBC Lab Results  Component Value Date   WBC 10.3 12/11/2019   HGB 13.7 12/11/2019   HCT 40.7 12/11/2019   MCV 82 12/11/2019   MCH 27.6 12/11/2019   RDW 14.5 12/11/2019   PLT 395 63/84/6659   Last metabolic panel Lab Results  Component Value Date   GLUCOSE 98 01/09/2022   NA 136 01/09/2022   K 3.9 01/09/2022   CL 96 01/09/2022   CO2 23 01/09/2022   BUN 15 01/09/2022   CREATININE 1.16 01/09/2022   EGFR 80 01/09/2022   CALCIUM 9.6 01/09/2022   PROT 7.6 10/04/2021   ALBUMIN 4.6 10/04/2021   LABGLOB 3.0 10/04/2021   AGRATIO 1.5 10/04/2021   BILITOT 0.6 10/04/2021   ALKPHOS 77 10/04/2021   AST 18 10/04/2021   ALT 17 10/04/2021   Last lipids Lab Results  Component Value Date   CHOL 174 10/04/2021   HDL 37 (L) 10/04/2021   LDLCALC 110 (H) 10/04/2021   TRIG 150 (H) 10/04/2021   CHOLHDL 4.7 10/04/2021   Last hemoglobin A1c Lab Results  Component Value Date   HGBA1C 6.1 (A) 01/09/2022   HGBA1C 6.1 01/09/2022   HGBA1C 6.1 01/09/2022   HGBA1C 6.1 01/09/2022   Last thyroid functions Lab Results  Component Value Date   TSH 1.430 12/11/2019   Last vitamin D Lab Results  Component Value Date   VD25OH 11.3 (L) 02/19/2018   Last vitamin B12 and Folate Lab Results  Component Value Date   [id number] 903 12/11/2019      The 10-year ASCVD risk score (Arnett DK, et al., 2019) is: 10.9%    Assessment & Plan:   Problem List Items Addressed This Visit       Cardiovascular and Mediastinum   Essential hypertension   Relevant Medications   amLODipine (NORVASC) 10 MG tablet   Other Relevant Orders   Basic Metabolic Panel (Completed)      Musculoskeletal and Integument   Eczema   Relevant Medications   triamcinolone ointment (KENALOG) 0.5 %   Other Visit Diagnoses     Type 2 diabetes mellitus without complication, without long-term current use of insulin ([facility])    -  Primary   Relevant Medications   Dulaglutide (TRULICITY) 0.09 QZ/3.0QT SOPN   Other Relevant Orders   HgB A1c (Completed)   Basic Metabolic Panel (Completed)   Hyperlipidemia LDL goal <100       Relevant Medications   amLODipine (NORVASC) 10 MG tablet   Morbid obesity ([facility])       Relevant Medications   Dulaglutide (TRULICITY) 6.22 QJ/3.3LK SOPN      1. Type 2 diabetes mellitus without complication, without long-term current use of insulin ([facility]) Today, patient's hemoglobin A1c is 6.1.  We will start a trial of Trulicity 5.62 mg.  Repeat patient's A1c in 3 months.  Continue to recommend a carbohydrate modified diet and follow-up with diabetes and nutrition education. - HgB A1c - Dulaglutide (TRULICITY) 5.63 SL/3.7DS SOPN; Inject 0.75 mg into the skin once a week.  Dispense: 0.5 mL; Refill: 5 - Basic Metabolic Panel  2. Essential hypertension - Continue medication, monitor blood pressure at home. Continue DASH diet.  Reminder to go to the ER if any CP, SOB, nausea, dizziness, severe HA, changes vision/speech, left arm numbness and tingling and jaw pain.   - amLODipine (NORVASC) 10 MG tablet; Take 0.5 tablets (5 mg total) by mouth daily.  Dispense: 90 tablet; Refill: 1 - Basic Metabolic Panel  3. Other eczema  -  triamcinolone ointment (KENALOG) 0.5 %; Apply 1 application  topically 2 (two) times daily.  Dispense: 30 g; Refill: 0  4. Hyperlipidemia LDL goal <100 No medication changes warranted  5. Morbid obesity ([place]) The patient is asked to make an attempt to improve diet and exercise patterns to aid in medical management of this problem.  No follow-ups on file.    [name], FNP

## 2022-01-10 LAB — BASIC METABOLIC PANEL
BUN/Creatinine Ratio: 13 (ref 9–20)
BUN: 15 mg/dL (ref 6–24)
CO2: 23 mmol/L (ref 20–29)
Calcium: 9.6 mg/dL (ref 8.7–10.2)
Chloride: 96 mmol/L (ref 96–106)
Creatinine, Ser: 1.16 mg/dL (ref 0.76–1.27)
Glucose: 98 mg/dL (ref 70–99)
Potassium: 3.9 mmol/L (ref 3.5–5.2)
Sodium: 136 mmol/L (ref 134–144)
eGFR: 80 mL/min/{1.73_m2} (ref >59)

## 2022-03-13 NOTE — Progress Notes (Unsigned)
Follow Up Note  RE: [name] MRN: [id number] DOB: [birth year] Date of Office Visit: 03/14/2022  Referring provider: Dorena Dew, FNP Primary care provider: Dorena Dew, FNP  Chief Complaint: No chief complaint on file.  History of Present Illness: I had the pleasure of seeing Edward Carr for a follow up visit at the [facility] on 03/13/2022. He is a 46 y.o. male, who is being followed for allergic rhinoconjunctivitis, asthma, adverse food reaction and rash. His previous allergy office visit was on 11/22/2021 with Dr. [name]. Today is a regular follow up visit.  Seasonal and perennial allergic rhinoconjunctivitis Past history - 2022 skin testing showed: Positive to grass, trees, ragweed, weed pollen, dust mites, cockroach. Borderline to cat and dog.  Interim history - asymptomatic with no medications. Continue environmental control measures.. Use Xyzal (levocetirizine) daily as needed. May take twice a day during allergy flares. May switch antihistamines every few months. Use azelastine nasal spray 1-2 sprays per nostril twice a day as needed for runny nose/drainage.   Asthma Past history - asthma like symptoms for 30+ years. Main triggers are exertion and panic attacks. Uses albuterol once a week with good benefit.  2022 spirometry showed restriction most likely due to body habitus. Interim history - only using albuterol 1 times per month. Still has coughing but did not take nasal spray or antihistamines consistently to see if it helps. Denies reflux symptoms.  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 Clinically feeling unchanged.  I question if patient is an underperceiver of his symptoms. Daily controller medication(s): START Breo 132mg 1 puff once a day and rinse mouth after each use.  If no improvement in coughing/breathing after 1 month then stop. If too  expensive call our office. May use albuterol rescue inhaler 2 puffs every 4 to 6 hours as needed for shortness of breath, chest tightness, coughing, and wheezing. May use albuterol rescue inhaler 2 puffs 5 to 15 minutes prior to strenuous physical activities. Monitor frequency of use.  Get spirometry at next visit. If no improvement in coughing - get CXR and do a trial of PPI, and stop lisinopril.    Other adverse food reactions, not elsewhere classified, subsequent encounter Past history - delayed reaction to shrimp as a child in the form of right eye swelling. Resolved without any intervention. Then had another episode of eye swelling as an adult but not sure of shellfish exposure. Tolerates finned fish with no issues.  2022 skin testing showed: Positive to shellfish, shrimp, crab, lobster, oyster.  Interim history - did not pick up epinephrine due to cost.  Continue strict avoidance of shellfish and mollusks I have prescribed epinephrine injectable and demonstrated proper use. For mild symptoms you can take over the counter antihistamines such as Benadryl and monitor symptoms closely. If symptoms worsen or if you have severe symptoms including breathing issues, throat closure, significant swelling, whole body hives, severe diarrhea and vomiting, lightheadedness then inject epinephrine and seek immediate medical care afterwards. Let me know if the symjepi epinephrine is too expensive.  Food action plan in place.    Rash and other nonspecific skin eruption Past history - Persistent facial rash even prior to the COVID-19 pandemic and mask wearing. No triggers noted. No prior dermatology evaluation. Interim history - improved, no dermatology evaluation. Ask your PCP for dermatology referral if not improving.  Continue proper skin care.  Don't use washcloth or the  [place] to wash your face. Use lukewarm water with your hands and a mild facial soap.   Return in about 3 months (around  02/21/2022).  Assessment and Plan: Edward Carr is a 46 y.o. male with: No problem-specific Assessment & Plan notes found for this encounter.  No follow-ups on file.  No orders of the defined types were placed in this encounter.  Lab Orders  No laboratory test(s) ordered today    Diagnostics: Spirometry:  Tracings reviewed. His effort: {Blank single:19197::"Good reproducible efforts.","It was hard to get consistent efforts and there is a question as to whether this reflects a maximal maneuver.","Poor effort, data can not be interpreted."} FVC: ***L FEV1: ***L, ***% predicted FEV1/FVC ratio: ***% Interpretation: {Blank single:19197::"Spirometry consistent with mild obstructive disease","Spirometry consistent with moderate obstructive disease","Spirometry consistent with severe obstructive disease","Spirometry consistent with possible restrictive disease","Spirometry consistent with mixed obstructive and restrictive disease","Spirometry uninterpretable due to technique","Spirometry consistent with normal pattern","No overt abnormalities noted given today's efforts"}.  Please see scanned spirometry results for details.  Skin Testing: {Blank single:19197::"Select foods","Environmental allergy panel","Environmental allergy panel and select foods","Food allergy panel","None","Deferred due to recent antihistamines use"}. *** Results discussed with patient/family.   Medication List:  Current Outpatient Medications  Medication Sig Dispense Refill  . albuterol (VENTOLIN HFA) 108 (90 Base) MCG/ACT inhaler Inhale 1 puff into the lungs every 6 (six) hours as needed for wheezing or shortness of breath. (Patient not taking: Reported on 01/09/2022) 8 g 1  . amLODipine (NORVASC) 10 MG tablet Take 0.5 tablets (5 mg total) by mouth daily. 90 tablet 1  . atorvastatin (LIPITOR) 20 MG tablet Take 1 tablet (20 mg total) by mouth daily. 90 tablet 3  . azelastine (ASTELIN) 0.1 % nasal spray Place 1-2 sprays into  both nostrils 2 (two) times daily as needed (nasal drainage). Use in each nostril as directed 30 mL 5  . Dulaglutide (TRULICITY) 0.17 PZ/0.2HE SOPN Inject 0.75 mg into the skin once a week. 0.5 mL 5  . EPINEPHrine (SYMJEPI) 0.3 MG/0.3ML SOSY Inject 0.3 mLs (0.3 mg total) as directed as needed (for anaphylactic reaction). (Patient not taking: Reported on 01/09/2022) 1 each 1  . fluticasone furoate-vilanterol (BREO ELLIPTA) 100-25 MCG/ACT AEPB Inhale 1 puff into the lungs daily. Rinse mouth after each use. (Patient not taking: Reported on 01/09/2022) 60 each 3  . levocetirizine (XYZAL) 5 MG tablet Take 1 tablet (5 mg total) by mouth every evening. (Patient not taking: Reported on 11/22/2021) 30 tablet 5  . lisinopril-hydrochlorothiazide (ZESTORETIC) 20-25 MG tablet Take 1 tablet by mouth daily. 90 tablet 1  . metFORMIN (GLUCOPHAGE) 500 MG tablet Take 1 tablet (500 mg total) by mouth 2 (two) times daily with a meal. 180 tablet 2  . triamcinolone ointment (KENALOG) 0.5 % Apply 1 application  topically 2 (two) times daily. 30 g 0   No current facility-administered medications for this visit.   Allergies: Allergies  Allergen Reactions  . Crab Extract Allergy Skin Test   . Shellfish Allergy   . Shrimp Extract Allergy Skin Test    I reviewed his past medical history, social history, family history, and environmental history and no significant changes have been reported from his previous visit.  Review of Systems  Constitutional:  Negative for appetite change, chills, fever and unexpected weight change.  HENT:  Negative for congestion, postnasal drip and rhinorrhea.   Eyes:  Negative for itching.  Respiratory:  Positive for cough. Negative for chest tightness, shortness of breath and wheezing.   Cardiovascular:  Negative  for chest pain.  Gastrointestinal:  Negative for abdominal pain.  Genitourinary:  Negative for difficulty urinating.  Skin:  Positive for rash.  Allergic/Immunologic: Positive for  environmental allergies and food allergies.  Neurological:  Negative for headaches.   Objective: There were no vitals taken for this visit. There is no height or weight on file to calculate BMI. Physical Exam Vitals and nursing note reviewed.  Constitutional:      Appearance: Normal appearance. He is well-developed. He is obese.  HENT:     Head: Normocephalic and atraumatic.     Right Ear: Tympanic membrane and external ear normal.     Left Ear: Tympanic membrane and external ear normal.     Nose: Nose normal.     Mouth/Throat:     Mouth: Mucous membranes are moist.     Pharynx: Oropharynx is clear.  Eyes:     Conjunctiva/sclera: Conjunctivae normal.  Cardiovascular:     Rate and Rhythm: Normal rate and regular rhythm.     Heart sounds: Normal heart sounds. No murmur heard.    No friction rub. No gallop.  Pulmonary:     Effort: Pulmonary effort is normal.     Breath sounds: Normal breath sounds. No wheezing, rhonchi or rales.  Musculoskeletal:     Cervical back: Neck supple.  Skin:    General: Skin is warm.     Findings: Rash present.     Comments: Erythematous hue on the face - improved.  Neurological:     Mental Status: He is alert and oriented to person, place, and time.  Psychiatric:        Behavior: Behavior normal.  Previous notes and tests were reviewed. The plan was reviewed with the patient/family, and all questions/concerned were addressed.  It was my pleasure to see Edward Carr today and participate in his care. Please feel free to contact me with any questions or concerns.  Sincerely,  Rexene Alberts, DO Allergy & Immunology  [facility] office: [place] office: [phone]

## 2022-03-14 ENCOUNTER — Ambulatory Visit (INDEPENDENT_AMBULATORY_CARE_PROVIDER_SITE_OTHER): Payer: No Typology Code available for payment source | Admitting: Allergy

## 2022-03-14 ENCOUNTER — Encounter: Payer: Self-pay | Admitting: Allergy

## 2022-03-14 VITALS — BP 130/82 | HR 77 | Temp 97.8°F | Resp 18

## 2022-03-14 DIAGNOSIS — J3089 Other allergic rhinitis: Secondary | ICD-10-CM

## 2022-03-14 DIAGNOSIS — H1013 Acute atopic conjunctivitis, bilateral: Secondary | ICD-10-CM

## 2022-03-14 DIAGNOSIS — R21 Rash and other nonspecific skin eruption: Secondary | ICD-10-CM

## 2022-03-14 DIAGNOSIS — T781XXD Other adverse food reactions, not elsewhere classified, subsequent encounter: Secondary | ICD-10-CM

## 2022-03-14 DIAGNOSIS — J302 Other seasonal allergic rhinitis: Secondary | ICD-10-CM

## 2022-03-14 DIAGNOSIS — J4520 Mild intermittent asthma, uncomplicated: Secondary | ICD-10-CM

## 2022-03-14 DIAGNOSIS — R053 Chronic cough: Secondary | ICD-10-CM

## 2022-03-14 DIAGNOSIS — H1010 Acute atopic conjunctivitis, unspecified eye: Secondary | ICD-10-CM

## 2022-03-14 MED ORDER — EPINEPHRINE 0.3 MG/0.3ML IJ SOAJ: 0.3000 mg | INTRAMUSCULAR | 1 refills | Status: DC | PRN

## 2022-03-14 NOTE — Assessment & Plan Note (Signed)
Past history - delayed reaction to shrimp as a child in the form of right eye swelling. Resolved without any intervention. Then had another episode of eye swelling as an adult but not sure of shellfish exposure. Tolerates finned fish with no issues.  2022 skin testing showed: Positive to shellfish, shrimp, crab, lobster, oyster.  Interim history - did not pick up epinephrine due to cost.   Continue strict avoidance of shellfish and mollusks.   I have prescribed epinephrine injectable device and demonstrated proper use. For mild symptoms you can take over the counter antihistamines such as Benadryl and monitor symptoms closely. If symptoms worsen or if you have severe symptoms including breathing issues, throat closure, significant swelling, whole body hives, severe diarrhea and vomiting, lightheadedness then inject epinephrine and seek immediate medical care afterwards.  Let me know if it's covered or not.   Emergency action plan in place.

## 2022-03-14 NOTE — Assessment & Plan Note (Signed)
Past history - asthma like symptoms for 30+ years. Main triggers are exertion and panic attacks. Uses albuterol once a week with good benefit.  2022 spirometry showed restriction most likely due to body habitus. Interim history - only using albuterol on rare occasions. Still has coughing but did not take nasal spray or antihistamines consistently to see if it helps. Denies reflux symptoms. Breo ineffective.  Today's spirometry showed some restriction most likely due to body habitus.   Daily controller medication(s): none.   May use albuterol rescue inhaler 2 puffs every 4 to 6 hours as needed for shortness of breath, chest tightness, coughing, and wheezing. May use albuterol rescue inhaler 2 puffs 5 to 15 minutes prior to strenuous physical activities. Monitor frequency of use.   Albuterol sample given.   Get spirometry at next visit.

## 2022-03-14 NOTE — Assessment & Plan Note (Signed)
Still coughing. Denies reflux symptoms.  . Get Chest X-ray. . See if it improves with daily allergy medication. . If not, then ask PCP about changing lisinopril blood pressure pill.

## 2022-03-14 NOTE — Assessment & Plan Note (Signed)
Past history - Persistent facial rash even prior to the COVID-19 pandemic and mask wearing. No triggers noted. No prior dermatology evaluation. Interim history - improved, no dermatology evaluation. Monitor symptoms.  Continue proper skin care.  Will hold off dermatology referral for now.  

## 2022-03-14 NOTE — Assessment & Plan Note (Addendum)
Past history - 2022 skin testing showed: Positive to grass, trees, ragweed, weed pollen, dust mites, cockroach. Borderline to cat and dog.  Interim history - PND and coughing.   Continue environmental control measures.  Use over the counter antihistamines such as Zyrtec (cetirizine), Claritin (loratadine), Allegra (fexofenadine), or Xyzal (levocetirizine) daily as needed. May take twice a day during allergy flares. May switch antihistamines every few months.

## 2022-03-14 NOTE — Patient Instructions (Addendum)
Environmental allergies: 2022 skin testing showed: Positive to grass, trees, ragweed, weed pollen, dust mites, cockroach. Borderline to cat and dog.  Continue environmental control measures. Use over the counter antihistamines such as Zyrtec (cetirizine), Claritin (loratadine), Allegra (fexofenadine), or Xyzal (levocetirizine) daily as needed. May take twice a day during allergy flares. May switch antihistamines every few months.  Food allergy: Continue strict avoidance of shellfish and mollusks I have prescribed epinephrine injectable device and demonstrated proper use. For mild symptoms you can take over the counter antihistamines such as Benadryl and monitor symptoms closely. If symptoms worsen or if you have severe symptoms including breathing issues, throat closure, significant swelling, whole body hives, severe diarrhea and vomiting, lightheadedness then inject epinephrine and seek immediate medical care afterwards. Let me know if it's covered or not.  Emergency action plan in place.   Asthma:  Daily controller medication(s): none.  May use albuterol rescue inhaler 2 puffs every 4 to 6 hours as needed for shortness of breath, chest tightness, coughing, and wheezing. May use albuterol rescue inhaler 2 puffs 5 to 15 minutes prior to strenuous physical activities. Monitor frequency of use.  Albuterol sample given.  Asthma control goals:  Full participation in all desired activities (may need albuterol before activity) Albuterol use two times or less a week on average (not counting use with activity) Cough interfering with sleep two times or less a month Oral steroids no more than once a year No hospitalizations   Coughing Get Chest X-ray. See if it improved with daily allergy medication. If not, then ask your PCP about changing lisinopril blood pressure pill.   Skin: Monitor symptoms.  Continue proper skin care.   Follow up in 3 months or sooner if needed.  Our [address] office  is moving in September 2023 to a new location. New address: [address] [place], [place], [place] [address] (white building). [address] [place] office: [phone] (same phone number).   Skin care recommendations  Bath time: Always use lukewarm water. AVOID very hot or cold water. Keep bathing time to 5-10 minutes. Do NOT use bubble bath. Use a mild soap and use just enough to wash the dirty areas. Do NOT scrub skin vigorously.  After bathing, pat dry your skin with a towel. Do NOT rub or scrub the skin.  Moisturizers and prescriptions:  ALWAYS apply moisturizers immediately after bathing (within 3 minutes). This helps to lock-in moisture. Use the moisturizer several times a day over the whole body. Good summer moisturizers include: Aveeno, CeraVe, Cetaphil. Good winter moisturizers include: Aquaphor, Vaseline, Cerave, Cetaphil, Eucerin, Vanicream. When using moisturizers along with medications, the moisturizer should be applied about one hour after applying the medication to prevent diluting effect of the medication or moisturize around where you applied the medications. When not using medications, the moisturizer can be continued twice daily as maintenance.  Laundry and clothing: Avoid laundry products with added color or perfumes. Use unscented hypo-allergenic laundry products such as Tide free, Cheer free & gentle, and All free and clear.  If the skin still seems dry or sensitive, you can try double-rinsing the clothes. Avoid tight or scratchy clothing such as wool. Do not use fabric softeners or dyer sheets.

## 2022-03-28 ENCOUNTER — Encounter: Payer: Self-pay | Attending: Family Medicine | Admitting: Skilled Nursing Facility1

## 2022-03-28 ENCOUNTER — Encounter: Payer: Self-pay | Admitting: Skilled Nursing Facility1

## 2022-03-28 DIAGNOSIS — E119 Type 2 diabetes mellitus without complications: Secondary | ICD-10-CM | POA: Insufficient documentation

## 2022-03-28 NOTE — Progress Notes (Signed)
   ASSESSMENTDiabetes Self-Management Education  Visit Type:     03/28/2022  [name], identified by name and date of birth, is a 46 y.o. male with a diagnosis of Diabetes:  .   ASSESSMENT  Pt is doing fantastic!  Dietitian offered pt a virtual appt next time due to him needing to take the bus and the pain that is; if it is too difficult we can just transition to in person.   Pt got his A1C down to 6.1!  Pts mother cooks his meals and helps support him with her trying to control her blood sugars as well.   Pt arrives having lost about 11 pounds. Pt states he has severely limited his snacking throughout the day. Pt states he was able to accomplish this by telling himself no and only allows it on cheat days which has has not actually done one.   Pt states he has been flossing stating he has to get more of the flossers.  Pt states since reducing his salt intake his feet have not been as swollen.  Pt states he doe shave plans to snack on vegetables instead of chips stating he has to get some celery.  Pt states he recognizes he needs to move around more to get his A1C down even further.  Pt states he uses whole wheat bread.  Pt states he is allergic to shellfish.    Pt smother makes his foods.   Pt states he has not been drinking any koolaid because that is what got him in this place to begin with.   Pt states he has increased his water consumption to 2-4 bottles.   Goals: Continue: Floss daily  Continue: Check your feet every day; be sure to talk to your doctor about your swollen feet at your upcoming appt Continue: Do a walking video every day  Continue: Get non starchy vegetables to snack on instead of chips NEW: use your wrist weights around the house NEW: have broccoli or carrots every day with dinner NEW: try one new vegetable: try green beans stewed in tomato sauce  DM Prescription: Metformin

## 2022-04-10 ENCOUNTER — Ambulatory Visit (INDEPENDENT_AMBULATORY_CARE_PROVIDER_SITE_OTHER): Payer: PRIVATE HEALTH INSURANCE | Admitting: Family Medicine

## 2022-04-10 VITALS — BP 123/75 | HR 75 | Temp 97.5°F | Ht 65.0 in | Wt 307.0 lb

## 2022-04-10 DIAGNOSIS — H5789 Other specified disorders of eye and adnexa: Secondary | ICD-10-CM

## 2022-04-10 DIAGNOSIS — E119 Type 2 diabetes mellitus without complications: Secondary | ICD-10-CM

## 2022-04-10 DIAGNOSIS — H5711 Ocular pain, right eye: Secondary | ICD-10-CM

## 2022-04-10 DIAGNOSIS — H1589 Other disorders of sclera: Secondary | ICD-10-CM

## 2022-04-10 LAB — POCT GLYCOSYLATED HEMOGLOBIN (HGB A1C)
HbA1c POC (<> result, manual entry): 6 % (ref 4.0–5.6)
HbA1c, POC (controlled diabetic range): 6 % (ref 0.0–7.0)
HbA1c, POC (prediabetic range): 6 % (ref 5.7–6.4)
Hemoglobin A1C: 6 % — AB (ref 4.0–5.6)

## 2022-04-10 MED ORDER — AZITHROMYCIN 1 % OP SOLN: 1.0000 [drp] | Freq: Two times a day (BID) | OPHTHALMIC | 0 refills | Status: AC

## 2022-04-10 NOTE — Patient Instructions (Addendum)
Ibuprofen OTC as directed  Bacterial Conjunctivitis, Adult Bacterial conjunctivitis is an infection of the clear membrane that covers the white part of the eye and the inner surface of the eyelid (conjunctiva). When the blood vessels in the conjunctiva become inflamed, the eye becomes red or pink. The eye often feels irritated or itchy. Bacterial conjunctivitis spreads easily from person to person (is contagious). It also spreads easily from one eye to the other eye. What are the causes? This condition is caused by bacteria. You may get the infection if you come into close contact with: A person who is infected with the bacteria. Items that are contaminated with the bacteria, such as a face towel, contact lens solution, or eye makeup. What increases the risk? You are more likely to develop this condition if: You are exposed to other people who have the infection. You wear contact lenses. You have a sinus infection. You have had a recent eye injury or surgery. You have a weak body defense system (immune system). You have a medical condition that causes dry eyes. What are the signs or symptoms? Symptoms of this condition include: Thick, yellowish discharge from the eye. This may turn into a crust on the eyelid overnight and cause your eyelids to stick together. Tearing or watery eyes. Itchy eyes. Burning feeling in your eyes. Eye redness. Swollen eyelids. Blurred vision. How is this diagnosed? This condition is diagnosed based on your symptoms and medical history. Your health care provider may also take a sample of discharge from your eye to find the cause of your infection. How is this treated? This condition may be treated with: Antibiotic eye drops or ointment to clear the infection more quickly and prevent the spread of infection to others. Antibiotic medicines taken by mouth (orally) to treat infections that do not respond to drops or ointments or that last longer than 10  days. Cool, wet cloths (cool compresses) placed on the eyes. Artificial tears applied 2-6 times a day. Follow these instructions at home: Medicines Take or apply your antibiotic medicine as told by your health care provider. Do not stop using the antibiotic, even if your condition improves, unless directed by your health care provider. Take or apply over-the-counter and prescription medicines only as told by your health care provider. Be very careful to avoid touching the edge of your eyelid with the eye-drop bottle or the ointment tube when you apply medicines to the affected eye. This will keep you from spreading the infection to your other eye or to other people. Managing discomfort Gently wipe away any drainage from your eye with a warm, wet washcloth or a cotton ball. Apply a clean, cool compress to your eye for 10-20 minutes, 3-4 times a day. General instructions Do not wear contact lenses until the inflammation is gone and your health care provider says it is safe to wear them again. Ask your health care provider how to sterilize or replace your contact lenses before you use them again. Wear glasses until you can resume wearing contact lenses. Avoid wearing eye makeup until the inflammation is gone. Throw away any old eye cosmetics that may be contaminated. Change or wash your pillowcase every day. Do not share towels or washcloths. This may spread the infection. Wash your hands often with soap and water for at least 20 seconds and especially before touching your face or eyes. Use paper towels to dry your hands. Avoid touching or rubbing your eyes. Do not drive or use heavy machinery if  your vision is blurred. Contact a health care provider if: You have a fever. Your symptoms do not get better after 10 days. Get help right away if: You have a fever and your symptoms suddenly get worse. You have severe pain when you move your eye. You have facial pain, redness, or swelling. You have a  sudden loss of vision. Summary Bacterial conjunctivitis is an infection of the clear membrane that covers the white part of the eye and the inner surface of the eyelid (conjunctiva). Bacterial conjunctivitis spreads easily from eye to eye and from person to person (is contagious). Wash your hands often with soap and water for at least 20 seconds and especially before touching your face or eyes. Use paper towels to dry your hands. Take or apply your antibiotic medicine as told by your health care provider. Do not stop using the antibiotic even if your condition improves. Contact a health care provider if you have a fever or if your symptoms do not get better after 10 days. Get help right away if you have a sudden loss of vision. This information is not intended to replace advice given to you by your health care provider. Make sure you discuss any questions you have with your health care provider. Document Revised: 10/26/2020 Document Reviewed: 10/26/2020 Elsevier Patient Education  [place].

## 2022-04-10 NOTE — Progress Notes (Signed)
Patient [facility]   Established Patient Office Visit  Subjective   Patient ID: Edward Carr, male    DOB: 08-07-1975  Age: 46 y.o. MRN: [id number]  Chief Complaint  Patient presents with   Eye Pain    Pt is here for 3 months DM follow up. Pt states his RT eye hurts, itching pt has been this issue for 2 weeks     Edward Carr is a 49 yea old male with a medical history significant for obesity, type 2 diabetes mellitus, hypertension, and hyperlipidemia presents for follow-up of chronic conditions and a primary complaint of right eye pain.  Right eye pain has been present over the past 3 to 4 days with no relief from eye patch and cold compresses.  Patient has not been exposed to pinkeye or sustained any eye injuries.  He states that he has mild pain to right eye and redness.  He has attempted over-the-counter eyedrops as well.  He denies any environmental allergy exposure.  Eye Problem  The right eye is affected. This is a new problem. The current episode started 1 to 4 weeks ago. The problem occurs constantly. The problem has been unchanged. There was no injury mechanism. The pain is at a severity of 3/10. The pain is mild. There is No known exposure to pink eye. He Does not wear contacts. Associated symptoms include eye redness. Pertinent negatives include no blurred vision, eye discharge, double vision, fever, foreign body sensation, photophobia, recent URI, vomiting or weakness. He has tried an eye patch and water for the symptoms. The treatment provided no relief.  Diabetes He presents for his follow-up diabetic visit. He has type 2 diabetes mellitus. His disease course has been stable. There are no hypoglycemic associated symptoms. Pertinent negatives for diabetes include no blurred vision, no chest pain, no fatigue, no foot ulcerations, no polydipsia, no polyphagia, no weakness and no weight loss. There are no hypoglycemic complications. There are no  diabetic complications. Risk factors for coronary artery disease include obesity, male sex and hypertension. Current diabetic treatment includes diet and oral agent (monotherapy). He is compliant with treatment most of the time. He has had a previous visit with a dietitian. He rarely participates in exercise. He does not see a podiatrist.Eye exam is not current.    Patient Active Problem List   Diagnosis Date Noted   Chronic cough 03/14/2022   Seasonal and perennial allergic rhinoconjunctivitis 03/20/2021   Morbid obesity with BMI of 50.0-59.9, adult ([place]) 12/13/2020   Other adverse food reactions, not elsewhere classified, subsequent encounter 11/14/2020   Rash and other nonspecific skin eruption 11/14/2020   Controlled type 2 diabetes mellitus without complication ([place]) 43/56/8616   Influenza vaccination given 05/22/2017   Essential hypertension 08/22/2016   Asthma 08/22/2016   Eczema 08/22/2016   Past Medical History:  Diagnosis Date   Asthma    Diabetes mellitus without complication ([place])    Hypertension    Past Surgical History:  Procedure Laterality Date   DENTAL SURGERY     Social History   Tobacco Use   Smoking status: Never   Smokeless tobacco: Never  Vaping Use   Vaping Use: Never used  Substance Use Topics   Alcohol use: No   Drug use: No   Social History   Socioeconomic History   Marital status: Single    Spouse name: Not on file   Number of children: Not on file   Years of education: Not  on file   Highest education level: Not on file  Occupational History   Not on file  Tobacco Use   Smoking status: Never   Smokeless tobacco: Never  Vaping Use   Vaping Use: Never used  Substance and Sexual Activity   Alcohol use: No   Drug use: No   Sexual activity: Never  Other Topics Concern   Not on file  Social History Narrative   Not on file   Social Determinants of Health   Financial Resource Strain: Not on file  Food Insecurity: Not on file   Transportation Needs: Not on file  Physical Activity: Not on file  Stress: Not on file  Social Connections: Not on file  Intimate Partner Violence: Not on file   Family Status  Relation Name Status   Mother  Alive   Father  Deceased   Sister  Alive   Family History  Problem Relation Age of Onset   Diabetes Mother    Cancer Father    Cancer Sister    Asthma Sister    Allergies  Allergen Reactions   Crab Extract Allergy Skin Test    Shellfish Allergy    Shrimp Extract Allergy Skin Test       Review of Systems  Constitutional:  Negative for chills, fatigue, fever and weight loss.  HENT: Negative.    Eyes:  Positive for pain and redness. Negative for blurred vision, double vision, photophobia and discharge.  Cardiovascular:  Negative for chest pain.  Gastrointestinal: Negative.  Negative for vomiting.  Genitourinary: Negative.   Skin: Negative.   Neurological: Negative.  Negative for weakness.  Endo/Heme/Allergies:  Negative for polydipsia and polyphagia.  Psychiatric/Behavioral: Negative.        Objective:     BP 123/75 (BP Location: Right Arm, Patient Position: Sitting, Cuff Size: Large)   Pulse 75   Temp (!) 97.5 F (36.4 C)   Ht _0  (1.651 m)   Wt (!) 307 lb (139.3 kg)   SpO2 97%   BMI 51.09 kg/m  BP Readings from Last 3 Encounters:  04/10/22 123/75  03/14/22 130/82  01/09/22 122/72   Wt Readings from Last 3 Encounters:  04/10/22 (!) 307 lb (139.3 kg)  03/28/22 (!) 314 lb 14.4 oz (142.8 kg)  01/09/22 (!) 316 lb 9.6 oz (143.6 kg)      Physical Exam Constitutional:      Appearance: He is obese.  Eyes:     General: Lids are normal. Lids are everted, no foreign bodies appreciated. Vision grossly intact. No allergic shiner, visual field deficit or scleral icterus.       Right eye: No foreign body, discharge or hordeolum.        Left eye: No discharge or hordeolum.     Conjunctiva/sclera:     Right eye: Right conjunctiva is injected. No chemosis,  exudate or hemorrhage.    Left eye: Left conjunctiva is not injected. No chemosis, exudate or hemorrhage. Cardiovascular:     Rate and Rhythm: Normal rate and regular rhythm.  Pulmonary:     Effort: Pulmonary effort is normal.     Breath sounds: Normal breath sounds.  Abdominal:     General: Bowel sounds are normal.  Musculoskeletal:        General: Normal range of motion.  Skin:    General: Skin is warm.  Neurological:     General: No focal deficit present.     Mental Status: He is alert.  Psychiatric:  Mood and Affect: Mood normal.        Thought Content: Thought content normal.        Judgment: Judgment normal.      Results for orders placed or performed in visit on 04/10/22  POCT glycosylated hemoglobin (Hb A1C)  Result Value Ref Range   Hemoglobin A1C 6.0 (A) 4.0 - 5.6 %   HbA1c POC (<> result, manual entry) 6.0 4.0 - 5.6 %   HbA1c, POC (prediabetic range) 6.0 5.7 - 6.4 %   HbA1c, POC (controlled diabetic range) 6.0 0.0 - 7.0 %    Last CBC Lab Results  Component Value Date   WBC 10.3 12/11/2019   HGB 13.7 12/11/2019   HCT 40.7 12/11/2019   MCV 82 12/11/2019   MCH 27.6 12/11/2019   RDW 14.5 12/11/2019   PLT 395 78/58/8502   Last metabolic panel Lab Results  Component Value Date   GLUCOSE 98 01/09/2022   NA 136 01/09/2022   K 3.9 01/09/2022   CL 96 01/09/2022   CO2 23 01/09/2022   BUN 15 01/09/2022   CREATININE 1.16 01/09/2022   EGFR 80 01/09/2022   CALCIUM 9.6 01/09/2022   PROT 7.6 10/04/2021   ALBUMIN 4.6 10/04/2021   LABGLOB 3.0 10/04/2021   AGRATIO 1.5 10/04/2021   BILITOT 0.6 10/04/2021   ALKPHOS 77 10/04/2021   AST 18 10/04/2021   ALT 17 10/04/2021   Last lipids Lab Results  Component Value Date   CHOL 174 10/04/2021   HDL 37 (L) 10/04/2021   LDLCALC 110 (H) 10/04/2021   TRIG 150 (H) 10/04/2021   CHOLHDL 4.7 10/04/2021   Last hemoglobin A1c Lab Results  Component Value Date   HGBA1C 6.0 (A) 04/10/2022   HGBA1C 6.0 04/10/2022    HGBA1C 6.0 04/10/2022   HGBA1C 6.0 04/10/2022   Last thyroid functions Lab Results  Component Value Date   TSH 1.430 12/11/2019   Last vitamin D Lab Results  Component Value Date   VD25OH 11.3 (L) 02/19/2018   Last vitamin B12 and Folate Lab Results  Component Value Date   VITAMINB12 288 12/11/2019      The 10-year ASCVD risk score (Arnett DK, et al., 2019) is: 11.1%    Assessment & Plan:   Problem List Items Addressed This Visit   None Visit Diagnoses     Type 2 diabetes mellitus without complication, without long-term current use of insulin (HCC)    -  Primary   Relevant Orders   POCT glycosylated hemoglobin (Hb A1C) (Completed)   Acute pain in right eye       Injection of surface of eye, right       Eye redness         1. Acute pain in right eye  - azithromycin (AZASITE) 1 % ophthalmic solution; Place 1 drop into the right eye 2 (two) times daily for 5 days.  Dispense: 0.5 mL; Refill: 0  2. Type 2 diabetes mellitus without complication, without long-term current use of insulin (HCC)  - POCT glycosylated hemoglobin (Hb A1C)  3. Injection of surface of eye, right  - azithromycin (AZASITE) 1 % ophthalmic solution; Place 1 drop into the right eye 2 (two) times daily for 5 days.  Dispense: 0.5 mL; Refill: 0  4. Eye redness  - azithromycin (AZASITE) 1 % ophthalmic solution; Place 1 drop into the right eye 2 (two) times daily for 5 days.  Dispense: 0.5 mL; Refill: 0  Return in about 3 months (around  07/10/2022).   [name]  APRN, MSN, FNP-C Patient [facility] [address] [place], [place] [address] [phone]

## 2022-04-11 ENCOUNTER — Other Ambulatory Visit: Payer: Self-pay | Admitting: Family Medicine

## 2022-04-11 DIAGNOSIS — H1589 Other disorders of sclera: Secondary | ICD-10-CM

## 2022-04-11 DIAGNOSIS — H1031 Unspecified acute conjunctivitis, right eye: Secondary | ICD-10-CM

## 2022-04-11 MED ORDER — POLYMYXIN B-TRIMETHOPRIM 10000-0.1 UNIT/ML-% OP SOLN: 2.0000 [drp] | Freq: Four times a day (QID) | OPHTHALMIC | 0 refills | Status: DC

## 2022-04-11 NOTE — Progress Notes (Signed)
Antibiotic eye drops changed due to cost.   Meds ordered this encounter  Medications   trimethoprim-polymyxin b (POLYTRIM) ophthalmic solution    Sig: Place 2 drops into the right eye every 6 (six) hours.    Dispense:  10 mL    Refill:  0    Order Specific Question:   Supervising Provider    Answer:   Tresa Garter [[id number]]   [name]  APRN, MSN, FNP-C Patient [facility] [address] [place], [place] [address] [phone]

## 2022-04-29 ENCOUNTER — Encounter: Payer: Self-pay | Admitting: Family Medicine

## 2022-06-02 ENCOUNTER — Other Ambulatory Visit: Payer: Self-pay | Admitting: Family Medicine

## 2022-06-02 DIAGNOSIS — E119 Type 2 diabetes mellitus without complications: Secondary | ICD-10-CM

## 2022-06-10 NOTE — Progress Notes (Unsigned)
Follow Up Note  RE: Edward Carr MRN: [id number] DOB: 03-22-1976 Date of Office Visit: 06/11/2022  Referring provider: Dorena Dew, FNP Primary care provider: Dorena Dew, FNP  Chief Complaint: No chief complaint on file.  History of Present Illness: I had the pleasure of seeing [name] for a follow up visit at the [facility] on 06/10/2022. He is a 46 y.o. male, who is being followed for asthma, chronic cough, adverse food reaction and rash. His previous allergy office visit was on 03/14/2022 with Dr. [name]. Today is a regular follow up visit.  Did you get checks x-ray done?  Asthma Past history - asthma like symptoms for 30+ years. Main triggers are exertion and panic attacks. Uses albuterol once a week with good benefit.  2022 spirometry showed restriction most likely due to body habitus. Interim history - only using albuterol on rare occasions. Still has coughing but did not take nasal spray or antihistamines consistently to see if it helps. Denies reflux symptoms. Breo ineffective. Today's spirometry showed some restriction most likely due to body habitus.  Daily controller medication(s): none.  May use albuterol rescue inhaler 2 puffs every 4 to 6 hours as needed for shortness of breath, chest tightness, coughing, and wheezing. May use albuterol rescue inhaler 2 puffs 5 to 15 minutes prior to strenuous physical activities. Monitor frequency of use.  Albuterol sample given.  Get spirometry at next visit.   Chronic cough Still coughing. Denies reflux symptoms.  Get Chest X-ray. See if it improves with daily allergy medication. If not, then ask PCP about changing lisinopril blood pressure pill.    Seasonal and perennial allergic rhinoconjunctivitis Past history - 2022 skin testing showed: Positive to grass, trees, ragweed, weed pollen, dust mites, cockroach. Borderline to cat and dog.  Interim history - PND and coughing.  Continue environmental  control measures. Use over the counter antihistamines such as Zyrtec (cetirizine), Claritin (loratadine), Allegra (fexofenadine), or Xyzal (levocetirizine) daily as needed. May take twice a day during allergy flares. May switch antihistamines every few months.   Other adverse food reactions, not elsewhere classified, subsequent encounter Past history - delayed reaction to shrimp as a child in the form of right eye swelling. Resolved without any intervention. Then had another episode of eye swelling as an adult but not sure of shellfish exposure. Tolerates finned fish with no issues.  2022 skin testing showed: Positive to shellfish, shrimp, crab, lobster, oyster.  Interim history - did not pick up epinephrine due to cost.  Continue strict avoidance of shellfish and mollusks.  I have prescribed epinephrine injectable device and demonstrated proper use. For mild symptoms you can take over the counter antihistamines such as Benadryl and monitor symptoms closely. If symptoms worsen or if you have severe symptoms including breathing issues, throat closure, significant swelling, whole body hives, severe diarrhea and vomiting, lightheadedness then inject epinephrine and seek immediate medical care afterwards. Let me know if it's covered or not.  Emergency action plan in place.    Rash and other nonspecific skin eruption Past history - Persistent facial rash even prior to the COVID-19 pandemic and mask wearing. No triggers noted. No prior dermatology evaluation. Interim history - improved, no dermatology evaluation. Monitor symptoms.  Continue proper skin care.  Will hold off dermatology referral for now.    Return in about 3 months (around 06/14/2022).  Assessment and Plan: Edward Carr is a 46 y.o. male with: No problem-specific Assessment & Plan notes found for  this encounter.  No follow-ups on file.  No orders of the defined types were placed in this encounter.  Lab Orders  No laboratory test(s)  ordered today    Diagnostics: Spirometry:  Tracings reviewed. His effort: {Blank single:19197::"Good reproducible efforts.","It was hard to get consistent efforts and there is a question as to whether this reflects a maximal maneuver.","Poor effort, data can not be interpreted."} FVC: ***L FEV1: ***L, ***% predicted FEV1/FVC ratio: ***% Interpretation: {Blank single:19197::"Spirometry consistent with mild obstructive disease","Spirometry consistent with moderate obstructive disease","Spirometry consistent with severe obstructive disease","Spirometry consistent with possible restrictive disease","Spirometry consistent with mixed obstructive and restrictive disease","Spirometry uninterpretable due to technique","Spirometry consistent with normal pattern","No overt abnormalities noted given today's efforts"}.  Please see scanned spirometry results for details.  Skin Testing: {Blank single:19197::"Select foods","Environmental allergy panel","Environmental allergy panel and select foods","Food allergy panel","None","Deferred due to recent antihistamines use"}. *** Results discussed with patient/family.   Medication List:  Current Outpatient Medications  Medication Sig Dispense Refill   albuterol (VENTOLIN HFA) 108 (90 Base) MCG/ACT inhaler Inhale 1 puff into the lungs every 6 (six) hours as needed for wheezing or shortness of breath. (Patient not taking: Reported on 04/10/2022) 8 g 1   amLODipine (NORVASC) 10 MG tablet Take 0.5 tablets (5 mg total) by mouth daily. 90 tablet 1   atorvastatin (LIPITOR) 20 MG tablet Take 1 tablet (20 mg total) by mouth daily. 90 tablet 3   azelastine (ASTELIN) 0.1 % nasal spray Place 1-2 sprays into both nostrils 2 (two) times daily as needed (nasal drainage). Use in each nostril as directed 30 mL 5   Dulaglutide (TRULICITY) 6.94 WN/4.6EV SOPN Inject 0.75 mg into the skin once a week. (Patient not taking: Reported on 04/10/2022) 0.5 mL 5   EPINEPHrine 0.3 mg/0.3 mL IJ  SOAJ injection Inject 0.3 mg into the muscle as needed for anaphylaxis. (Patient not taking: Reported on 04/10/2022) 2 each 1   fluticasone furoate-vilanterol (BREO ELLIPTA) 100-25 MCG/ACT AEPB Inhale 1 puff into the lungs daily. Rinse mouth after each use. (Patient not taking: Reported on 03/14/2022) 60 each 3   levocetirizine (XYZAL) 5 MG tablet Take 1 tablet (5 mg total) by mouth every evening. 30 tablet 5   lisinopril-hydrochlorothiazide (ZESTORETIC) 20-25 MG tablet Take 1 tablet by mouth daily. 90 tablet 1   metFORMIN (GLUCOPHAGE) 500 MG tablet TAKE 1 TABLET BY MOUTH TWICE DAILY WITH A MEAL 60 tablet 0   triamcinolone ointment (KENALOG) 0.5 % Apply 1 application  topically 2 (two) times daily. 30 g 0   trimethoprim-polymyxin b (POLYTRIM) ophthalmic solution Place 2 drops into the right eye every 6 (six) hours. 10 mL 0   No current facility-administered medications for this visit.   Allergies: Allergies  Allergen Reactions   Crab Extract Allergy Skin Test    Shellfish Allergy    Shrimp Extract Allergy Skin Test    I reviewed his past medical history, social history, family history, and environmental history and no significant changes have been reported from his previous visit.  Review of Systems  Constitutional:  Negative for appetite change, chills, fever and unexpected weight change.  HENT:  Positive for postnasal drip. Negative for congestion and rhinorrhea.   Eyes:  Negative for itching.  Respiratory:  Positive for cough. Negative for chest tightness, shortness of breath and wheezing.   Cardiovascular:  Negative for chest pain.  Gastrointestinal:  Negative for abdominal pain.  Genitourinary:  Negative for difficulty urinating.  Skin:  Positive for rash.  Allergic/Immunologic: Positive for environmental  allergies and food allergies.  Neurological:  Negative for headaches.    Objective: There were no vitals taken for this visit. There is no height or weight on file to calculate  BMI. Physical Exam Vitals and nursing note reviewed.  Constitutional:      Appearance: Normal appearance. He is well-developed. He is obese.  HENT:     Head: Normocephalic and atraumatic.     Right Ear: Tympanic membrane and external ear normal.     Left Ear: Tympanic membrane and external ear normal.     Nose: Nose normal.     Mouth/Throat:     Mouth: Mucous membranes are moist.     Pharynx: Oropharynx is clear.  Eyes:     Conjunctiva/sclera: Conjunctivae normal.  Cardiovascular:     Rate and Rhythm: Normal rate and regular rhythm.     Heart sounds: Normal heart sounds. No murmur heard.    No friction rub. No gallop.  Pulmonary:     Effort: Pulmonary effort is normal.     Breath sounds: Normal breath sounds. No wheezing, rhonchi or rales.  Musculoskeletal:     Cervical back: Neck supple.  Skin:    General: Skin is warm.     Findings: Rash present.     Comments: Erythematous hue on the face - improved.  Neurological:     Mental Status: He is alert and oriented to person, place, and time.  Psychiatric:        Behavior: Behavior normal.    Previous notes and tests were reviewed. The plan was reviewed with the patient/family, and all questions/concerned were addressed.  It was my pleasure to see Edward Carr today and participate in his care. Please feel free to contact me with any questions or concerns.  Sincerely,  Rexene Alberts, DO Allergy & Immunology  [facility] office: [place] office: [phone]

## 2022-06-11 ENCOUNTER — Emergency Department (HOSPITAL_COMMUNITY)
Admission: EM | Admit: 2022-06-11 | Discharge: 2022-06-11 | Disposition: A | Discharge status: Home / Self Care | Payer: PRIVATE HEALTH INSURANCE | Attending: Emergency Medicine | Admitting: Emergency Medicine

## 2022-06-11 ENCOUNTER — Encounter (HOSPITAL_COMMUNITY): Payer: Self-pay

## 2022-06-11 ENCOUNTER — Ambulatory Visit (INDEPENDENT_AMBULATORY_CARE_PROVIDER_SITE_OTHER): Payer: No Typology Code available for payment source | Admitting: Allergy

## 2022-06-11 ENCOUNTER — Encounter: Payer: Self-pay | Admitting: Allergy

## 2022-06-11 ENCOUNTER — Other Ambulatory Visit: Payer: Self-pay

## 2022-06-11 VITALS — BP 240/100 | HR 93 | Temp 98.1°F | Resp 16 | Ht 65.0 in | Wt 307.2 lb

## 2022-06-11 DIAGNOSIS — E119 Type 2 diabetes mellitus without complications: Secondary | ICD-10-CM | POA: Insufficient documentation

## 2022-06-11 DIAGNOSIS — I10 Essential (primary) hypertension: Secondary | ICD-10-CM | POA: Insufficient documentation

## 2022-06-11 DIAGNOSIS — J45909 Unspecified asthma, uncomplicated: Secondary | ICD-10-CM | POA: Insufficient documentation

## 2022-06-11 DIAGNOSIS — H1013 Acute atopic conjunctivitis, bilateral: Secondary | ICD-10-CM

## 2022-06-11 DIAGNOSIS — J3089 Other allergic rhinitis: Secondary | ICD-10-CM

## 2022-06-11 DIAGNOSIS — J4520 Mild intermittent asthma, uncomplicated: Secondary | ICD-10-CM

## 2022-06-11 DIAGNOSIS — R053 Chronic cough: Secondary | ICD-10-CM

## 2022-06-11 DIAGNOSIS — Z7951 Long term (current) use of inhaled steroids: Secondary | ICD-10-CM | POA: Insufficient documentation

## 2022-06-11 DIAGNOSIS — I158 Other secondary hypertension: Secondary | ICD-10-CM

## 2022-06-11 DIAGNOSIS — J302 Other seasonal allergic rhinitis: Secondary | ICD-10-CM

## 2022-06-11 DIAGNOSIS — R21 Rash and other nonspecific skin eruption: Secondary | ICD-10-CM

## 2022-06-11 DIAGNOSIS — T781XXD Other adverse food reactions, not elsewhere classified, subsequent encounter: Secondary | ICD-10-CM

## 2022-06-11 DIAGNOSIS — Z7984 Long term (current) use of oral hypoglycemic drugs: Secondary | ICD-10-CM | POA: Insufficient documentation

## 2022-06-11 DIAGNOSIS — Z760 Encounter for issue of repeat prescription: Secondary | ICD-10-CM | POA: Insufficient documentation

## 2022-06-11 DIAGNOSIS — Z79899 Other long term (current) drug therapy: Secondary | ICD-10-CM | POA: Insufficient documentation

## 2022-06-11 LAB — CBC WITH DIFFERENTIAL/PLATELET
Abs Immature Granulocytes: 0.02 10*3/uL (ref 0.00–0.07)
Basophils Absolute: 0 10*3/uL (ref 0.0–0.1)
Basophils Relative: 0 %
Eosinophils Absolute: 0.3 10*3/uL (ref 0.0–0.5)
Eosinophils Relative: 3 %
HCT: 44.2 % (ref 39.0–52.0)
Hemoglobin: 14.2 g/dL (ref 13.0–17.0)
Immature Granulocytes: 0 %
Lymphocytes Relative: 25 %
Lymphs Abs: 2.3 10*3/uL (ref 0.7–4.0)
MCH: 26.7 pg (ref 26.0–34.0)
MCHC: 32.1 g/dL (ref 30.0–36.0)
MCV: 83.2 fL (ref 80.0–100.0)
Monocytes Absolute: 0.5 10*3/uL (ref 0.1–1.0)
Monocytes Relative: 6 %
Neutro Abs: 6 10*3/uL (ref 1.7–7.7)
Neutrophils Relative %: 66 %
Platelets: 318 10*3/uL (ref 150–400)
RBC: 5.31 MIL/uL (ref 4.22–5.81)
RDW: 14.1 % (ref 11.5–15.5)
WBC: 9.1 10*3/uL (ref 4.0–10.5)
nRBC: 0 % (ref 0.0–0.2)

## 2022-06-11 LAB — BASIC METABOLIC PANEL
Anion gap: 14 (ref 5–15)
BUN: 13 mg/dL (ref 6–20)
CO2: 26 mmol/L (ref 22–32)
Calcium: 9.5 mg/dL (ref 8.9–10.3)
Chloride: 101 mmol/L (ref 98–111)
Creatinine, Ser: 1.13 mg/dL (ref 0.61–1.24)
GFR, Estimated: >60 mL/min (ref >60)
Glucose, Bld: 100 mg/dL — ABNORMAL HIGH (ref 70–99)
Potassium: 3.6 mmol/L (ref 3.5–5.1)
Sodium: 141 mmol/L (ref 135–145)

## 2022-06-11 MED ORDER — HYDROCHLOROTHIAZIDE 25 MG PO TABS: 25.0000 mg | ORAL_TABLET | Freq: Every day | ORAL | 0 refills | Status: DC

## 2022-06-11 MED ORDER — HYDRALAZINE HCL 10 MG PO TABS
10.0000 mg | ORAL_TABLET | Freq: Once | ORAL | Status: AC
  Administered 2022-06-11: 10 mg via ORAL
  Filled 2022-06-11: qty 1

## 2022-06-11 MED ORDER — AMLODIPINE BESYLATE 10 MG PO TABS: 10.0000 mg | ORAL_TABLET | Freq: Every day | ORAL | 0 refills | Status: DC

## 2022-06-11 NOTE — Assessment & Plan Note (Signed)
Coughing improved, off lisinopril. Didn't get CXR. No need to get CXR as symptoms improved. Avoid lisinopril type of blood pressure medication - it might have been contributing to your coughing.

## 2022-06-11 NOTE — Assessment & Plan Note (Signed)
Past history - Persistent facial rash even prior to the COVID-19 pandemic and mask wearing. No triggers noted. No prior dermatology evaluation. Interim history - improved, no dermatology evaluation. Monitor symptoms.  Continue proper skin care.  Will hold off dermatology referral for now.

## 2022-06-11 NOTE — ED Provider Triage Note (Signed)
Emergency Medicine Provider Triage Evaluation Note  Edward Carr , a 46 y.o. male  was evaluated in triage.  Pt complains of hypertension.  Patient states that he checked his blood pressure this morning and it ranged in the 552C-802M systolic.  Patient has history of hypertension on lisinopril and another medication that he cannot recall.  Patient has not taken any blood pressure medications in the last 30 days.  Denies any chest pain, shortness of breath, nausea, vomiting, vision changes, bowel changes, urinary symptoms, fever.  Review of Systems  Positive: As above Negative: As above  Physical Exam  BP (!) 193/128   Pulse 89   Temp 98.5 F (36.9 C) (Oral)   Resp (!) 22   Ht '5\' 5"'$  (1.651 m)   Wt (!) 139.7 kg   SpO2 92%   BMI 51.25 kg/m  Gen:   Awake, no distress   Resp:  Normal effort  MSK:   Moves extremities without difficulty  Other:    Medical Decision Making  Medically screening exam initiated at 3:51 PM.  Appropriate orders placed.  Lennox Dolberry was informed that the remainder of the evaluation will be completed by another provider, this initial triage assessment does not replace that evaluation, and the importance of remaining in the ED until their evaluation is complete.     Rex Kras, PA 06/12/22 0900

## 2022-06-11 NOTE — ED Provider Notes (Signed)
[facility] DEPT Provider Note  CSN: [id number] Arrival date & time: 06/11/22 1431  Chief Complaint(s) Hypertension and Medication Refill  HPI Edward Carr is a 46 y.o. male with a past medical history listed below including hypertension here for elevated blood pressures noted in his pulmonologist office.  They recommended he be seen in the emergency department.  Patient reports that he has not taken his blood pressure medicine in about a month due to financial constraints.  He denies any associated chest pain or shortness of breath.  No headache.  No focal deficits.  No extremity swelling.  The history is provided by the patient.    Past Medical History Past Medical History:  Diagnosis Date   Asthma    Diabetes mellitus without complication ([place])    Hypertension    Patient Active Problem List   Diagnosis Date Noted   Chronic cough 03/14/2022   Seasonal and perennial allergic rhinoconjunctivitis 03/20/2021   Morbid obesity with BMI of 50.0-59.9, adult ([place]) 12/13/2020   Other adverse food reactions, not elsewhere classified, subsequent encounter 11/14/2020   Rash and other nonspecific skin eruption 11/14/2020   Controlled type 2 diabetes mellitus without complication ([place]) 15/11/6977   Influenza vaccination given 05/22/2017   Essential hypertension 08/22/2016   Asthma 08/22/2016   Eczema 08/22/2016   Home Medication(s) Prior to Admission medications   Medication Sig Start Date End Date Taking? Authorizing Provider  amLODipine (NORVASC) 10 MG tablet Take 1 tablet (10 mg total) by mouth daily. 06/11/22 07/11/22 Yes Donni Oglesby, Grayce Sessions, MD  hydrochlorothiazide (HYDRODIURIL) 25 MG tablet Take 1 tablet (25 mg total) by mouth daily. 06/11/22 07/11/22 Yes Coretta Leisey, Grayce Sessions, MD  albuterol (VENTOLIN HFA) 108 (90 Base) MCG/ACT inhaler Inhale 1 puff into the lungs every 6 (six) hours as needed for wheezing or shortness of breath. 11/24/19   Dorena Dew, FNP  amLODipine (NORVASC) 10 MG tablet Take 0.5 tablets (5 mg total) by mouth daily. 01/09/22   Dorena Dew, FNP  atorvastatin (LIPITOR) 20 MG tablet Take 1 tablet (20 mg total) by mouth daily. 10/03/21   Dorena Dew, FNP  azelastine (ASTELIN) 0.1 % nasal spray Place 1-2 sprays into both nostrils 2 (two) times daily as needed (nasal drainage). Use in each nostril as directed 07/31/21   Garnet Sierras, DO  Dulaglutide (TRULICITY) 4.80 XK/5.5VZ SOPN Inject 0.75 mg into the skin once a week. 01/09/22   Dorena Dew, FNP  EPINEPHrine 0.3 mg/0.3 mL IJ SOAJ injection Inject 0.3 mg into the muscle as needed for anaphylaxis. 03/14/22   Garnet Sierras, DO  levocetirizine (XYZAL) 5 MG tablet Take 1 tablet (5 mg total) by mouth every evening. 07/31/21   Garnet Sierras, DO  lisinopril-hydrochlorothiazide (ZESTORETIC) 20-25 MG tablet Take 1 tablet by mouth daily. 10/03/21   Dorena Dew, FNP  metFORMIN (GLUCOPHAGE) 500 MG tablet TAKE 1 TABLET BY MOUTH TWICE DAILY WITH A MEAL 06/04/22   Dorena Dew, FNP  triamcinolone ointment (KENALOG) 0.5 % Apply 1 application  topically 2 (two) times daily. 01/09/22   Dorena Dew, FNP  trimethoprim-polymyxin b (POLYTRIM) ophthalmic solution Place 2 drops into the right eye every 6 (six) hours. 04/11/22   Dorena Dew, FNP  Allergies Crab extract allergy skin test, Lisinopril, Shellfish allergy, and Shrimp extract allergy skin test  Review of Systems Review of Systems As noted in HPI  Physical Exam Vital Signs  I have reviewed the triage vital signs BP (!) 221/142   Pulse 79   Temp 98.2 F (36.8 C)   Resp 18   Ht '5\' 5"'$  (1.651 m)   Wt (!) 139.7 kg   SpO2 98%   BMI 51.25 kg/m   Physical Exam Vitals reviewed.  Constitutional:      General: He is not in acute distress.    Appearance: He is well-developed. He  is obese. He is not diaphoretic.  HENT:     Head: Normocephalic and atraumatic.     Nose: Nose normal.  Eyes:     General: No scleral icterus.       Right eye: No discharge.        Left eye: No discharge.     Conjunctiva/sclera: Conjunctivae normal.     Pupils: Pupils are equal, round, and reactive to light.  Cardiovascular:     Rate and Rhythm: Normal rate and regular rhythm.     Heart sounds: No murmur heard.    No friction rub. No gallop.  Pulmonary:     Effort: Pulmonary effort is normal. No respiratory distress.     Breath sounds: Normal breath sounds. No stridor. No rales.  Abdominal:     General: There is no distension.     Palpations: Abdomen is soft.     Tenderness: There is no abdominal tenderness.  Musculoskeletal:        General: No tenderness.     Cervical back: Normal range of motion and neck supple.     Right lower leg: No edema.     Left lower leg: No edema.  Skin:    General: Skin is warm and dry.     Findings: No erythema or rash.  Neurological:     Mental Status: He is alert and oriented to person, place, and time.     ED Results and Treatments Labs (all labs ordered are listed, but only abnormal results are displayed) Labs Reviewed  BASIC METABOLIC PANEL - Abnormal; Notable for the following components:      Result Value   Glucose, Bld 100 (*)    All other components within normal limits  CBC WITH DIFFERENTIAL/PLATELET                                                                                                                         EKG  EKG Interpretation  Date/Time:  Monday June 11 2022 16:57:39 EST Ventricular Rate:  86 PR Interval:  182 QRS Duration: 87 QT Interval:  375 QTC Calculation: 449 R Axis:   -33 Text Interpretation: Sinus rhythm Left ventricular hypertrophy similar to prior (5/21)  no stemi Confirmed by Wynona Dove ([id number]) on 06/11/2022 5:08:04 PM       Radiology No results found.  Medications  Ordered in  ED Medications  hydrALAZINE (APRESOLINE) tablet 10 mg (10 mg Oral Given 06/11/22 2308)                                                                                                                                     Procedures Procedures  (including critical care time)  Medical Decision Making / ED Course   Medical Decision Making Amount and/or Complexity of Data Reviewed Labs: ordered. Decision-making details documented in ED Course. ECG/medicine tests: ordered and independent interpretation performed. Decision-making details documented in ED Course.  Risk Prescription drug management.   Patient presents for hypertension He is asymptomatic.  Will assess for end-organ damage.  EKG without acute ischemic changes, dysrhythmias or blocks.  LVH is noted. CBC without leukocytosis or anemia.  Metabolic panel without significant electrolyte derangements or renal sufficiency.  In triage patient was given hydralazine.  Discussed option for online coupons for his medication.  Patient reports that his pulmonologist instructed him to stop taking one of his blood pressure medicines due to a side effect of cough.  On review of his records, patient has taken combination lisinopril with HCTZ.   Patient also on amlodipine.  We will provide patient with a prescription for amlodipine and HCTZ. I checked the online prescription coupon site and noted that patient's hydralazine is $4 at [facility] and $9 at [facility].  No coupons needed.  Patient informed.     Final Clinical Impression(s) / ED Diagnoses Final diagnoses:  Other secondary hypertension    The patient appears reasonably screened and/or stabilized for discharge and I doubt any other medical condition or other [facility] requiring further screening, evaluation, or treatment in the ED at this time. I have discussed the findings, Dx and Tx plan with the patient/family who expressed understanding and agree(s) with the plan. Discharge  instructions discussed at length. The patient/family was given strict return precautions who verbalized understanding of the instructions. No further questions at time of discharge.  Disposition: Discharge  Condition: Good  ED Discharge Orders          Ordered    hydrochlorothiazide (HYDRODIURIL) 25 MG tablet  Daily        06/11/22 2329    amLODipine (NORVASC) 10 MG tablet  Daily        06/11/22 2329            Follow Up: Dorena Dew, FNP [address] [address] [phone]  Call  to schedule an appointment for close follow up           This chart was dictated using voice recognition software.  Despite best efforts to proofread,  errors can occur which can change the documentation meaning.    Fatima Blank, MD 06/11/22 [phone]

## 2022-06-11 NOTE — Assessment & Plan Note (Signed)
Ran out of blood pressure medication x 1 month. Denies any symptoms. Please go to the ER due to high blood pressure. In office readings: 240/100 and 200/140 Recommend that you follow up with your PCP and monitor your blood pressure at home.  Avoid ace inhibitor as it caused coughing in the past.

## 2022-06-11 NOTE — ED Triage Notes (Signed)
Patient c/o hypertension. Patient states he ran out of his medication x 1 month. Patient denies any headache, blurred vision, or dizziness.  BP in triage-193/128

## 2022-06-11 NOTE — Assessment & Plan Note (Signed)
Past history - asthma like symptoms for 30+ years. Main triggers are exertion and panic attacks. Uses albuterol once a week with good benefit.  2022 spirometry showed restriction most likely due to body habitus. Interim history - denies reflux, coughing better but off lisinopril x 1 month, only used albuterol once.  Today's spirometry showed some restriction most likely due to body habitus.  Daily controller medication(s): none.  May use albuterol rescue inhaler 2 puffs every 4 to 6 hours as needed for shortness of breath, chest tightness, coughing, and wheezing. May use albuterol rescue inhaler 2 puffs 5 to 15 minutes prior to strenuous physical activities. Monitor frequency of use.  Avoid lisinopril type of blood pressure medication - it might have been contributing to your coughing.

## 2022-06-11 NOTE — Assessment & Plan Note (Signed)
Past history - 2022 skin testing showed: Positive to grass, trees, ragweed, weed pollen, dust mites, cockroach. Borderline to cat and dog.  Interim history - controlled with no meds.  Continue environmental control measures. Use over the counter antihistamines such as Zyrtec (cetirizine), Claritin (loratadine), Allegra (fexofenadine), or Xyzal (levocetirizine) daily as needed. May take twice a day during allergy flares. May switch antihistamines every few months.

## 2022-06-11 NOTE — Patient Instructions (Addendum)
High blood pressure Your blood pressure was very high today.  Please go to the ER due to your high blood pressure. Recommend that you follow up with your PCP and monitor your blood pressure at home.  240/100 and 200/140  Environmental allergies: 2022 skin testing showed: Positive to grass, trees, ragweed, weed pollen, dust mites, cockroach. Borderline to cat and dog.  Continue environmental control measures. Use over the counter antihistamines such as Zyrtec (cetirizine), Claritin (loratadine), Allegra (fexofenadine), or Xyzal (levocetirizine) daily as needed. May take twice a day during allergy flares. May switch antihistamines every few months.  Food allergy: Continue strict avoidance of shellfish and mollusks For mild symptoms you can take over the counter antihistamines such as Benadryl and monitor symptoms closely. If symptoms worsen or if you have severe symptoms including breathing issues, throat closure, significant swelling, whole body hives, severe diarrhea and vomiting, lightheadedness then inject epinephrine and seek immediate medical care afterwards. Emergency action plan in place.   Asthma:  Your breathing test was not as good as the last one.  Daily controller medication(s): none.  May use albuterol rescue inhaler 2 puffs every 4 to 6 hours as needed for shortness of breath, chest tightness, coughing, and wheezing. May use albuterol rescue inhaler 2 puffs 5 to 15 minutes prior to strenuous physical activities. Monitor frequency of use.  Asthma control goals:  Full participation in all desired activities (may need albuterol before activity) Albuterol use two times or less a week on average (not counting use with activity) Cough interfering with sleep two times or less a month Oral steroids no more than once a year No hospitalizations   Coughing Avoid lisinopril type of blood pressure medication - it might have been contributing to your coughing.   Skin: Monitor symptoms.   Continue proper skin care.   Follow up in 3 months or sooner if needed.  Skin care recommendations  Bath time: Always use lukewarm water. AVOID very hot or cold water. Keep bathing time to 5-10 minutes. Do NOT use bubble bath. Use a mild soap and use just enough to wash the dirty areas. Do NOT scrub skin vigorously.  After bathing, pat dry your skin with a towel. Do NOT rub or scrub the skin.  Moisturizers and prescriptions:  ALWAYS apply moisturizers immediately after bathing (within 3 minutes). This helps to lock-in moisture. Use the moisturizer several times a day over the whole body. Good summer moisturizers include: Aveeno, CeraVe, Cetaphil. Good winter moisturizers include: Aquaphor, Vaseline, Cerave, Cetaphil, Eucerin, Vanicream. When using moisturizers along with medications, the moisturizer should be applied about one hour after applying the medication to prevent diluting effect of the medication or moisturize around where you applied the medications. When not using medications, the moisturizer can be continued twice daily as maintenance.  Laundry and clothing: Avoid laundry products with added color or perfumes. Use unscented hypo-allergenic laundry products such as Tide free, Cheer free & gentle, and All free and clear.  If the skin still seems dry or sensitive, you can try double-rinsing the clothes. Avoid tight or scratchy clothing such as wool. Do not use fabric softeners or dyer sheets.

## 2022-06-11 NOTE — Assessment & Plan Note (Signed)
Past history - delayed reaction to shrimp as a child in the form of right eye swelling. Resolved without any intervention. Then had another episode of eye swelling as an adult but not sure of shellfish exposure. Tolerates finned fish with no issues.  2022 skin testing showed: Positive to shellfish, shrimp, crab, lobster, oyster.  Interim history - did not pick up epinephrine due to cost. No reactions. Continue strict avoidance of shellfish and mollusks For mild symptoms you can take over the counter antihistamines such as Benadryl and monitor symptoms closely. If symptoms worsen or if you have severe symptoms including breathing issues, throat closure, significant swelling, whole body hives, severe diarrhea and vomiting, lightheadedness then inject epinephrine and seek immediate medical care afterwards. Emergency action plan in place.

## 2022-06-12 ENCOUNTER — Encounter: Payer: Self-pay | Admitting: Skilled Nursing Facility1

## 2022-06-12 ENCOUNTER — Encounter: Payer: Self-pay | Attending: Family Medicine | Admitting: Skilled Nursing Facility1

## 2022-06-12 DIAGNOSIS — E119 Type 2 diabetes mellitus without complications: Secondary | ICD-10-CM | POA: Insufficient documentation

## 2022-06-12 NOTE — Progress Notes (Unsigned)
   ASSESSMENTDiabetes Self-Management Education  Visit Type:     06/12/2022  [name], identified by name and date of birth, is a 46 y.o. male with a diagnosis of Diabetes:  .   ASSESSMENT  Pt is doing fantastic!  Dietitian offered pt a virtual appt next time due to him needing to take the bus and the pain that is; if it is too difficult we can just transition to in person.   Pt got his A1C down to 6.0!  Taking metformin.   Pts mother cooks his meals and helps support him with her trying to control her blood sugars as well.   Pt arrives having lost about 11 pounds. Pt states he has severely limited his snacking throughout the day. Pt states he was able to accomplish this by telling himself no and only allows it on cheat days which has has not actually done one.   Pt states he has been flossing stating he has to get more of the flossers.  Pt states since reducing his salt intake his feet have not been as swollen.  Pt states he doe shave plans to snack on vegetables instead of chips stating he has to get some celery.  Pt states he recognizes he needs to move around more to get his A1C down even further.  Pt states he uses whole wheat bread.  Pt states he is allergic to shellfish.    Pt smother makes his foods.   Pt states he has not been drinking any koolaid because that is what got him in this place to begin with.   Pt states he has increased his water consumption to 2-4 bottles.   Pt states he has 2 new bp medications.  Pt state she is worried about where his foods will be coming from since losing food stamps abut hopes to have a new job by the new year.  Pt states he trie stos aty active walking around the house.    24 hour recall: Firs meal: Leftovers or waffles  Second meal: sandwich chips Third meal: friend chicken  Beverages: gatorade zero, soda, 4 bottles of water water  Goals: Continue: Floss daily  Continue: Check your feet every day; be sure to  talk to your doctor about your swollen feet at your upcoming appt Continue: Do a walking video every day  Continue: Get non starchy vegetables to snack on instead of chips continue: use your wrist weights around the house (4 pounds per arm) continue: have broccoli or carrots every day with dinner (steams them or cooks them int eh oven) Continue/NEW: try one new vegetable: try green beans stewed in tomato sauce NEW: visit [facility]  NEW: ask you mom to make grilled chicken instead of fried  DM Prescription: Metformin

## 2022-06-25 ENCOUNTER — Other Ambulatory Visit: Payer: Self-pay | Admitting: Family Medicine

## 2022-06-25 DIAGNOSIS — E119 Type 2 diabetes mellitus without complications: Secondary | ICD-10-CM

## 2022-07-17 ENCOUNTER — Other Ambulatory Visit: Payer: Self-pay

## 2022-07-17 ENCOUNTER — Ambulatory Visit (INDEPENDENT_AMBULATORY_CARE_PROVIDER_SITE_OTHER): Payer: PRIVATE HEALTH INSURANCE | Admitting: Family Medicine

## 2022-07-17 ENCOUNTER — Encounter: Payer: Self-pay | Admitting: Family Medicine

## 2022-07-17 VITALS — BP 131/77 | HR 86 | Temp 97.8°F | Ht 65.0 in | Wt 307.0 lb

## 2022-07-17 DIAGNOSIS — I10 Essential (primary) hypertension: Secondary | ICD-10-CM

## 2022-07-17 DIAGNOSIS — Z23 Encounter for immunization: Secondary | ICD-10-CM

## 2022-07-17 DIAGNOSIS — E785 Hyperlipidemia, unspecified: Secondary | ICD-10-CM

## 2022-07-17 DIAGNOSIS — E119 Type 2 diabetes mellitus without complications: Secondary | ICD-10-CM

## 2022-07-17 LAB — POCT GLYCOSYLATED HEMOGLOBIN (HGB A1C): Hemoglobin A1C: 6.2 % — AB (ref 4.0–5.6)

## 2022-07-17 MED ORDER — HYDROCHLOROTHIAZIDE 25 MG PO TABS
25.0000 mg | ORAL_TABLET | Freq: Every day | ORAL | 5 refills | Status: DC
  Filled 2022-07-17: qty 30, 30d supply, fill #0
  Filled 2022-08-17: qty 30, 30d supply, fill #1
  Filled 2022-09-19: qty 30, 30d supply, fill #2
  Filled 2022-10-15: qty 30, 30d supply, fill #3
  Filled 2022-11-15: qty 30, 30d supply, fill #4
  Filled 2022-12-27: qty 30, 30d supply, fill #5

## 2022-07-17 MED ORDER — AMLODIPINE BESYLATE 10 MG PO TABS
10.0000 mg | ORAL_TABLET | Freq: Every day | ORAL | 6 refills | Status: DC
  Filled 2022-07-17: qty 30, 30d supply, fill #0
  Filled 2022-08-17: qty 30, 30d supply, fill #1
  Filled 2022-09-19: qty 30, 30d supply, fill #2
  Filled 2022-10-15: qty 30, 30d supply, fill #3
  Filled 2022-11-15: qty 30, 30d supply, fill #4
  Filled 2022-12-27: qty 30, 30d supply, fill #5
  Filled 2023-01-29: qty 30, 30d supply, fill #6

## 2022-07-17 MED ORDER — ATORVASTATIN CALCIUM 20 MG PO TABS
20.0000 mg | ORAL_TABLET | Freq: Every day | ORAL | 5 refills | Status: DC
  Filled 2022-07-17: qty 30, 30d supply, fill #0
  Filled 2022-08-17 (×2): qty 30, 30d supply, fill #1
  Filled 2022-09-19: qty 30, 30d supply, fill #2
  Filled 2022-10-15: qty 30, 30d supply, fill #3
  Filled 2022-11-15 (×2): qty 30, 30d supply, fill #4
  Filled 2022-12-27: qty 30, 30d supply, fill #5
  Filled 2023-01-29: qty 30, 30d supply, fill #6
  Filled 2023-02-28: qty 30, 30d supply, fill #7
  Filled 2023-03-30: qty 30, 30d supply, fill #8
  Filled 2023-05-04: qty 30, 30d supply, fill #9
  Filled 2023-06-04: qty 30, 30d supply, fill #10
  Filled 2023-07-01: qty 30, 30d supply, fill #11

## 2022-07-17 NOTE — Patient Instructions (Signed)
Diabetes Mellitus Action Plan Following a diabetes action plan is a way for you to manage your diabetes (diabetes mellitus) symptoms. The plan is color-coded to help you understand what actions you need to take based on any symptoms you are having. If you have symptoms in the red zone, you need medical care right away. If you have symptoms in the yellow zone, you are having problems. If you have symptoms in the green zone, you are doing well. Learning about and understanding diabetes can take time. Follow the plan that you develop with your health care provider. Know the target range for your blood sugar (glucose) level, and review your treatment plan with your health care provider at each visit. The target range for my blood sugar level is __________________________ mg/dL. Red zone Get medical help right away if you have any of the following symptoms: A blood sugar test result that is below 54 mg/dL (3 mmol/L). A blood sugar test result that is at or above 240 mg/dL (13.3 mmol/L) for 2 days in a row. Confusion or trouble thinking clearly. Difficulty breathing. Sickness or a fever for 2 or more days that is not getting better. Moderate or large ketone levels in your urine. Feeling tired or having no energy. If you have any red zone symptoms, do not wait to see if the symptoms will go away. Get medical help right away. Call your local emergency services (911 in the [place] Do not drive yourself to the hospital. If you have severely low blood sugar (severe hypoglycemia) and you cannot eat or drink, you may need glucagon. Make sure a family member or close friend knows how to check your blood sugar and how to give you glucagon. You may need to be treated in a hospital for this condition. Yellow zone If you have any of the following symptoms, your diabetes is not under control and you may need to make some changes: A blood sugar test result that is at or above 240 mg/dL (13.3 mmol/L) for 2 days in a  row. Blood sugar test results that are below 70 mg/dL (3.9 mmol/L). Other symptoms of hypoglycemia, such as: Shaking or feeling light-headed. Confusion or irritability. Feeling hungry. Having a fast heartbeat. If you have any yellow zone symptoms: Treat your hypoglycemia by eating or drinking 15 grams of a rapid-acting carbohydrate. Follow the 15:15 rule: Take 15 grams of a rapid-acting carbohydrate, such as: 1 tube of glucose gel. 4 glucose pills. 4 oz (120 mL) of fruit juice. 4 oz (120 mL) of regular (not diet) soda. Check your blood sugar 15 minutes after you take the carbohydrate. If the repeat blood sugar test is still at or below 70 mg/dL (3.9 mmol/L), take 15 grams of a carbohydrate again. If your blood sugar does not increase above 70 mg/dL (3.9 mmol/L) after 3 tries, get medical help right away. After your blood sugar returns to normal, eat a meal or a snack within 1 hour. Keep taking your daily medicines as told by your health care provider. Check your blood sugar more often than you normally would. Write down your results. Call your health care provider if you have trouble keeping your blood sugar in your target range.  Green zone These signs mean you are doing well and you can continue what you are doing to manage your diabetes: Your blood sugar is within your personal target range. For most people, a blood sugar level before a meal (preprandial) should be 80-130 mg/dL (4.4-7.2 mmol/L). You feel   well, and you are able to do daily activities. If you are in the green zone, continue to manage your diabetes as told by your health care provider. To do this: Eat a healthy diet. Exercise regularly. Check your blood sugar as told by your health care provider. Take your medicines as told by your health care provider.  Where to find more information American Diabetes Association (ADA): diabetes.org Association of Diabetes Care & Education Specialists (ADCES):  diabeteseducator.org Summary Following a diabetes action plan is a way for you to manage your diabetes symptoms. The plan is color-coded to help you understand what actions you need to take based on any symptoms you are having. Follow the plan that you develop with your health care provider. Make sure you know your personal target blood sugar level. Review your treatment plan with your health care provider at each visit. This information is not intended to replace advice given to you by your health care provider. Make sure you discuss any questions you have with your health care provider. Document Revised: 01/21/2020 Document Reviewed: 01/21/2020 Elsevier Patient Education  2023 Elsevier Inc.  

## 2022-07-17 NOTE — Progress Notes (Signed)
Subjective   Patient ID: Edward Carr, male    DOB: 1975/12/03  Age: 46 y.o. MRN: [id number]  Chief Complaint  Patient presents with   Diabetes    Follow up    Edward Carr is a 46 year old male with a medical history significant for type 2 diabetes mellitus, hyperlipidemia, hypertension, morbid obesity, and mild intermittent asthma presents for follow-up of chronic conditions.  Patient was recently evaluated on 06/11/2022 in the emergency department for hypertension.  Patient was out of his home antihypertensive medications and was noted to have a markedly elevated BP during a pulmonology visit.  Patient was able to pick up short course of his antihypertensive medications and has been taking consistently since that time.  He is requesting refills on medications today.   Diabetes He presents for his follow-up diabetic visit. He has type 2 diabetes mellitus. Pertinent negatives for diabetes include no blurred vision, no chest pain, no fatigue, no foot paresthesias, no foot ulcerations, no polydipsia, no polyphagia, no polyuria, no visual change, no weakness and no weight loss. Pertinent negatives for hypoglycemia complications include no hospitalization, no nocturnal hypoglycemia, no required assistance and no required glucagon injection. He rarely participates in exercise. An ACE inhibitor/angiotensin II receptor blocker is not being taken (Allergy to lisinopril). He does not see a podiatrist.Eye exam is not current.    Patient Active Problem List   Diagnosis Date Noted   Chronic cough 03/14/2022   Seasonal and perennial allergic rhinoconjunctivitis 03/20/2021   Morbid obesity with BMI of 50.0-59.9, adult ([place]) 12/13/2020   Other adverse food reactions, not elsewhere classified, subsequent encounter 11/14/2020   Rash and other nonspecific skin eruption 11/14/2020   Controlled type 2 diabetes mellitus without complication ([place]) 62/69/4854   Influenza vaccination given 05/22/2017    Essential hypertension 08/22/2016   Asthma 08/22/2016   Eczema 08/22/2016   Past Medical History:  Diagnosis Date   Asthma    Diabetes mellitus without complication ([place])    Hypertension    Past Surgical History:  Procedure Laterality Date   DENTAL SURGERY     Social History   Tobacco Use   Smoking status: Never   Smokeless tobacco: Never  Vaping Use   Vaping Use: Never used  Substance Use Topics   Alcohol use: No   Drug use: No   Social History   Socioeconomic History   Marital status: Single    Spouse name: Not on file   Number of children: Not on file   Years of education: Not on file   Highest education level: Not on file  Occupational History   Not on file  Tobacco Use   Smoking status: Never   Smokeless tobacco: Never  Vaping Use   Vaping Use: Never used  Substance and Sexual Activity   Alcohol use: No   Drug use: No   Sexual activity: Never  Other Topics Concern   Not on file  Social History Narrative   Not on file   Social Determinants of Health   Financial Resource Strain: Not on file  Food Insecurity: Not on file  Transportation Needs: Not on file  Physical Activity: Not on file  Stress: Not on file  Social Connections: Not on file  Intimate Partner Violence: Not on file   Family Status  Relation Name Status   Mother  Alive   Father  Deceased   Sister  Alive   Family History  Problem Relation Age of Onset   Diabetes Mother  Cancer Father    Cancer Sister    Asthma Sister    Allergies  Allergen Reactions   Crab Extract Allergy Skin Test    Lisinopril     coughing   Shellfish Allergy    Shrimp Extract Allergy Skin Test       Review of Systems  Constitutional:  Negative for fatigue and weight loss.  HENT: Negative.    Eyes:  Negative for blurred vision.  Respiratory: Negative.    Cardiovascular: Negative.  Negative for chest pain.  Neurological: Negative.  Negative for weakness.  Endo/Heme/Allergies:  Negative for  polydipsia and polyphagia.  Psychiatric/Behavioral: Negative.        Objective:     BP 131/77   Pulse 86   Temp 97.8 F (36.6 C)   Ht '5\' 5"'$  (1.651 m)   Wt (!) 307 lb (139.3 kg)   SpO2 98%   BMI 51.09 kg/m  BP Readings from Last 3 Encounters:  07/17/22 131/77  06/11/22 (!) 197/125  06/11/22 (!) 240/100   Wt Readings from Last 3 Encounters:  07/17/22 (!) 307 lb (139.3 kg)  06/11/22 (!) 308 lb (139.7 kg)  06/11/22 (!) 307 lb 3.2 oz (139.3 kg)      Physical Exam Constitutional:      Appearance: He is obese.  Eyes:     Pupils: Pupils are equal, round, and reactive to light.  Cardiovascular:     Rate and Rhythm: Normal rate.     Pulses: Normal pulses.  Pulmonary:     Effort: Pulmonary effort is normal.  Musculoskeletal:        General: Normal range of motion.     Right lower leg: Edema present.     Left lower leg: Edema present.  Skin:    General: Skin is warm.  Neurological:     General: No focal deficit present.     Mental Status: Mental status is at baseline.  Psychiatric:        Mood and Affect: Mood normal.        Thought Content: Thought content normal.        Judgment: Judgment normal.      Results for orders placed or performed in visit on 07/17/22  POCT glycosylated hemoglobin (Hb A1C)  Result Value Ref Range   Hemoglobin A1C 6.2 (A) 4.0 - 5.6 %   HbA1c POC (<> result, manual entry)     HbA1c, POC (prediabetic range)     HbA1c, POC (controlled diabetic range)      Last CBC Lab Results  Component Value Date   WBC 9.1 06/11/2022   HGB 14.2 06/11/2022   HCT 44.2 06/11/2022   MCV 83.2 06/11/2022   MCH 26.7 06/11/2022   RDW 14.1 06/11/2022   PLT 318 31/49/7026   Last metabolic panel Lab Results  Component Value Date   GLUCOSE 100 (H) 06/11/2022   NA 141 06/11/2022   K 3.6 06/11/2022   CL 101 06/11/2022   CO2 26 06/11/2022   BUN 13 06/11/2022   CREATININE 1.13 06/11/2022   GFRNONAA >60 06/11/2022   CALCIUM 9.5 06/11/2022   PROT 7.6  10/04/2021   ALBUMIN 4.6 10/04/2021   LABGLOB 3.0 10/04/2021   AGRATIO 1.5 10/04/2021   BILITOT 0.6 10/04/2021   ALKPHOS 77 10/04/2021   AST 18 10/04/2021   ALT 17 10/04/2021   ANIONGAP 14 06/11/2022   Last lipids Lab Results  Component Value Date   CHOL 174 10/04/2021   HDL 37 (L) 10/04/2021  LDLCALC 110 (H) 10/04/2021   TRIG 150 (H) 10/04/2021   CHOLHDL 4.7 10/04/2021   Last hemoglobin A1c Lab Results  Component Value Date   HGBA1C 6.2 (A) 07/17/2022   Last thyroid functions Lab Results  Component Value Date   TSH 1.430 12/11/2019   Last vitamin D Lab Results  Component Value Date   VD25OH 11.3 (L) 02/19/2018   Last vitamin B12 and Folate Lab Results  Component Value Date   AJGOTLXB26 203 12/11/2019      The 10-year ASCVD risk score (Arnett DK, et al., 2019) is: 12.4%    Assessment & Plan:   Problem List Items Addressed This Visit       Cardiovascular and Mediastinum   Essential hypertension   Relevant Medications   amLODipine (NORVASC) 10 MG tablet   hydrochlorothiazide (HYDRODIURIL) 25 MG tablet   atorvastatin (LIPITOR) 20 MG tablet   Other Visit Diagnoses     Type 2 diabetes mellitus without complication, without long-term current use of insulin (HCC)    -  Primary   Relevant Medications   atorvastatin (LIPITOR) 20 MG tablet   Other Relevant Orders   Microalbumin/Creatinine Ratio, Urine   POCT glycosylated hemoglobin (Hb A1C) (Completed)   Ambulatory referral to Podiatry   Hyperlipidemia LDL goal <100       Relevant Medications   amLODipine (NORVASC) 10 MG tablet   hydrochlorothiazide (HYDRODIURIL) 25 MG tablet   atorvastatin (LIPITOR) 20 MG tablet   Other Relevant Orders   Lipid Panel   Flu vaccine need       Relevant Orders   Flu Vaccine QUAD 36+ mos IM (Fluarix, Fluzone & Afluria Quad PF      1. Type 2 diabetes mellitus without complication, without long-term current use of insulin (HCC) Hemoglobin A1c is 6.2, which is at  goal. Will continue metformin - Microalbumin/Creatinine Ratio, Urine - POCT glycosylated hemoglobin (Hb A1C) - Ambulatory referral to Podiatry  2. Hyperlipidemia LDL goal <100  The patient is asked to make an attempt to improve diet and exercise patterns to aid in medical management of this problem.  The 10-year ASCVD risk score (Arnett DK, et al., 2019) is: 12.4%   Values used to calculate the score:     Age: 72 years     Sex: Male     Is Non-Hispanic African American: Yes     Diabetic: Yes     Tobacco smoker: No     Systolic Blood Pressure: 559 mmHg     Is BP treated: Yes     HDL Cholesterol: 37 mg/dL     Total Cholesterol: 174 mg/dL  - Lipid Panel - amLODipine (NORVASC) 10 MG tablet; Take 1 tablet (10 mg total) by mouth daily.  Dispense: 30 tablet; Refill: 6 - atorvastatin (LIPITOR) 20 MG tablet; Take 1 tablet (20 mg total) by mouth daily.  Dispense: 60 tablet; Refill: 5  3. Essential hypertension BP 131/77   Pulse 86   Temp 97.8 F (36.6 C)   Ht '5\' 5"'$  (1.651 m)   Wt (!) 307 lb (139.3 kg)   SpO2 98%   BMI 51.09 kg/m   - amLODipine (NORVASC) 10 MG tablet; Take 1 tablet (10 mg total) by mouth daily.  Dispense: 30 tablet; Refill: 6 - hydrochlorothiazide (HYDRODIURIL) 25 MG tablet; Take 1 tablet (25 mg total) by mouth daily.  Dispense: 30 tablet; Refill: 5  4. Flu vaccine need  - Flu Vaccine QUAD 36+ mos IM (Fluarix, Fluzone & Afluria Quad PF  Return in about 3 months (around 10/16/2022) for hypertension, hyperlipidemia.   [name]  APRN, MSN, FNP-C Patient [facility] [address] [place], [place]  [id number] [phone]

## 2022-07-18 LAB — MICROALBUMIN / CREATININE URINE RATIO
Creatinine, Urine: 52.5 mg/dL
Microalb/Creat Ratio: 14 mg/g creat (ref 0–29)
Microalbumin, Urine: 7.1 ug/mL

## 2022-07-18 LAB — LIPID PANEL
Chol/HDL Ratio: 4.6 ratio (ref 0.0–5.0)
Cholesterol, Total: 170 mg/dL (ref 100–199)
HDL: 37 mg/dL — ABNORMAL LOW (ref >39)
LDL Chol Calc (NIH): 110 mg/dL — ABNORMAL HIGH (ref 0–99)
Triglycerides: 129 mg/dL (ref 0–149)
VLDL Cholesterol Cal: 23 mg/dL (ref 5–40)

## 2022-07-19 ENCOUNTER — Other Ambulatory Visit: Payer: Self-pay

## 2022-08-02 ENCOUNTER — Other Ambulatory Visit: Payer: Self-pay

## 2022-08-05 ENCOUNTER — Other Ambulatory Visit: Payer: Self-pay | Admitting: Family Medicine

## 2022-08-05 DIAGNOSIS — E119 Type 2 diabetes mellitus without complications: Secondary | ICD-10-CM

## 2022-08-17 ENCOUNTER — Other Ambulatory Visit: Payer: Self-pay

## 2022-08-20 ENCOUNTER — Other Ambulatory Visit: Payer: Self-pay

## 2022-09-09 NOTE — Progress Notes (Unsigned)
Follow Up Note  RE: Edward Carr MRN: [id number] DOB: 01-Mar-1976 Date of Office Visit: 09/10/2022  Referring provider: Dorena Dew, FNP Primary care provider: Dorena Dew, FNP  Chief Complaint: No chief complaint on file.  History of Present Illness: I had the pleasure of seeing Tam Piana for a follow up visit at the [facility] on 09/09/2022. He is a 47 y.o. male, who is being followed for asthma, allergic rhinoconjunctivitis, cough, adverse food reaction, rash. His previous allergy office visit was on 06/11/2022 with Dr. [name]. Today is a regular follow up visit.  Asthma Past history - asthma like symptoms for 30+ years. Main triggers are exertion and panic attacks. Uses albuterol once a week with good benefit.  2022 spirometry showed restriction most likely due to body habitus. Interim history - denies reflux, coughing better but off lisinopril x 1 month, only used albuterol once.  Today's spirometry showed some restriction most likely due to body habitus.  Daily controller medication(s): none.  May use albuterol rescue inhaler 2 puffs every 4 to 6 hours as needed for shortness of breath, chest tightness, coughing, and wheezing. May use albuterol rescue inhaler 2 puffs 5 to 15 minutes prior to strenuous physical activities. Monitor frequency of use.  Avoid lisinopril type of blood pressure medication - it might have been contributing to your coughing.    Seasonal and perennial allergic rhinoconjunctivitis Past history - 2022 skin testing showed: Positive to grass, trees, ragweed, weed pollen, dust mites, cockroach. Borderline to cat and dog.  Interim history - controlled with no meds.  Continue environmental control measures. Use over the counter antihistamines such as Zyrtec (cetirizine), Claritin (loratadine), Allegra (fexofenadine), or Xyzal (levocetirizine) daily as needed. May take twice a day during allergy flares. May switch antihistamines every  few months.   Chronic cough Coughing improved, off lisinopril. Didn't get CXR. No need to get CXR as symptoms improved. Avoid lisinopril type of blood pressure medication - it might have been contributing to your coughing.   Other adverse food reactions, not elsewhere classified, subsequent encounter Past history - delayed reaction to shrimp as a child in the form of right eye swelling. Resolved without any intervention. Then had another episode of eye swelling as an adult but not sure of shellfish exposure. Tolerates finned fish with no issues.  2022 skin testing showed: Positive to shellfish, shrimp, crab, lobster, oyster.  Interim history - did not pick up epinephrine due to cost. No reactions. Continue strict avoidance of shellfish and mollusks For mild symptoms you can take over the counter antihistamines such as Benadryl and monitor symptoms closely. If symptoms worsen or if you have severe symptoms including breathing issues, throat closure, significant swelling, whole body hives, severe diarrhea and vomiting, lightheadedness then inject epinephrine and seek immediate medical care afterwards. Emergency action plan in place.    Rash and other nonspecific skin eruption Past history - Persistent facial rash even prior to the COVID-19 pandemic and mask wearing. No triggers noted. No prior dermatology evaluation. Interim history - improved, no dermatology evaluation. Monitor symptoms.  Continue proper skin care.  Will hold off dermatology referral for now.    Essential hypertension Ran out of blood pressure medication x 1 month. Denies any symptoms. Please go to the ER due to high blood pressure. In office readings: 240/100 and 200/140 Recommend that you follow up with your PCP and monitor your blood pressure at home.  Avoid ace inhibitor as it caused coughing in  the past.    Return in about 3 months (around 09/11/2022).  Assessment and Plan: [name] is a 47 y.o. male with: No  problem-specific Assessment & Plan notes found for this encounter.  No follow-ups on file.  No orders of the defined types were placed in this encounter.  Lab Orders  No laboratory test(s) ordered today    Diagnostics: Spirometry:  Tracings reviewed. His effort: {Blank single:19197::"Good reproducible efforts.","It was hard to get consistent efforts and there is a question as to whether this reflects a maximal maneuver.","Poor effort, data can not be interpreted."} FVC: ***L FEV1: ***L, ***% predicted FEV1/FVC ratio: ***% Interpretation: {Blank single:19197::"Spirometry consistent with mild obstructive disease","Spirometry consistent with moderate obstructive disease","Spirometry consistent with severe obstructive disease","Spirometry consistent with possible restrictive disease","Spirometry consistent with mixed obstructive and restrictive disease","Spirometry uninterpretable due to technique","Spirometry consistent with normal pattern","No overt abnormalities noted given today's efforts"}.  Please see scanned spirometry results for details.  Skin Testing: {Blank single:19197::"Select foods","Environmental allergy panel","Environmental allergy panel and select foods","Food allergy panel","None","Deferred due to recent antihistamines use"}. *** Results discussed with patient/family.   Medication List:  Current Outpatient Medications  Medication Sig Dispense Refill   albuterol (VENTOLIN HFA) 108 (90 Base) MCG/ACT inhaler Inhale 1 puff into the lungs every 6 (six) hours as needed for wheezing or shortness of breath. (Patient not taking: Reported on 07/17/2022) 8 g 1   amLODipine (NORVASC) 10 MG tablet Take 1 tablet (10 mg total) by mouth daily. 30 tablet 6   atorvastatin (LIPITOR) 20 MG tablet Take 1 tablet (20 mg total) by mouth daily. 60 tablet 5   azelastine (ASTELIN) 0.1 % nasal spray Place 1-2 sprays into both nostrils 2 (two) times daily as needed (nasal drainage). Use in each  nostril as directed (Patient not taking: Reported on 07/17/2022) 30 mL 5   Dulaglutide (TRULICITY) A999333 0000000 SOPN Inject 0.75 mg into the skin once a week. (Patient not taking: Reported on 07/17/2022) 0.5 mL 5   EPINEPHrine 0.3 mg/0.3 mL IJ SOAJ injection Inject 0.3 mg into the muscle as needed for anaphylaxis. (Patient not taking: Reported on 07/17/2022) 2 each 1   hydrochlorothiazide (HYDRODIURIL) 25 MG tablet Take 1 tablet (25 mg total) by mouth daily. 30 tablet 5   levocetirizine (XYZAL) 5 MG tablet Take 1 tablet (5 mg total) by mouth every evening. (Patient not taking: Reported on 07/17/2022) 30 tablet 5   metFORMIN (GLUCOPHAGE) 500 MG tablet TAKE 1 TABLET BY MOUTH TWICE DAILY WITH A MEAL 180 tablet 0   triamcinolone ointment (KENALOG) 0.5 % Apply 1 application  topically 2 (two) times daily. 30 g 0   trimethoprim-polymyxin b (POLYTRIM) ophthalmic solution Place 2 drops into the right eye every 6 (six) hours. (Patient not taking: Reported on 07/17/2022) 10 mL 0   No current facility-administered medications for this visit.   Allergies: Allergies  Allergen Reactions   Crab Extract Allergy Skin Test    Lisinopril     coughing   Shellfish Allergy    Shrimp Extract Allergy Skin Test    I reviewed his past medical history, social history, family history, and environmental history and no significant changes have been reported from his previous visit.  Review of Systems  Constitutional:  Negative for appetite change, chills, fever and unexpected weight change.  HENT:  Negative for congestion, postnasal drip and rhinorrhea.   Eyes:  Negative for itching.  Respiratory:  Negative for cough, chest tightness, shortness of breath and wheezing.   Cardiovascular:  Negative for chest  pain.  Gastrointestinal:  Negative for abdominal pain.  Genitourinary:  Negative for difficulty urinating.  Skin:  Positive for rash.  Allergic/Immunologic: Positive for environmental allergies and food  allergies.  Neurological:  Negative for headaches.    Objective: There were no vitals taken for this visit. There is no height or weight on file to calculate BMI. Physical Exam Vitals and nursing note reviewed.  Constitutional:      Appearance: Normal appearance. He is well-developed. He is obese.  HENT:     Head: Normocephalic and atraumatic.     Right Ear: Tympanic membrane and external ear normal.     Left Ear: Tympanic membrane and external ear normal.     Nose: Nose normal.     Mouth/Throat:     Mouth: Mucous membranes are moist.     Pharynx: Oropharynx is clear.  Eyes:     Conjunctiva/sclera: Conjunctivae normal.  Cardiovascular:     Rate and Rhythm: Normal rate and regular rhythm.     Heart sounds: Normal heart sounds. No murmur heard.    No friction rub. No gallop.  Pulmonary:     Effort: Pulmonary effort is normal.     Breath sounds: Normal breath sounds. No wheezing, rhonchi or rales.  Musculoskeletal:     Cervical back: Neck supple.  Skin:    General: Skin is warm.     Findings: Rash present.     Comments: Erythematous hue on the face - improved.  Neurological:     Mental Status: He is alert and oriented to person, place, and time.  Psychiatric:        Behavior: Behavior normal.    Previous notes and tests were reviewed. The plan was reviewed with the patient/family, and all questions/concerned were addressed.  It was my pleasure to see Mosie today and participate in his care. Please feel free to contact me with any questions or concerns.  Sincerely,  Rexene Alberts, DO Allergy & Immunology  [facility] office: [place] office: [phone]

## 2022-09-10 ENCOUNTER — Encounter: Payer: Self-pay | Admitting: Allergy

## 2022-09-10 ENCOUNTER — Other Ambulatory Visit: Payer: Self-pay

## 2022-09-10 ENCOUNTER — Ambulatory Visit (INDEPENDENT_AMBULATORY_CARE_PROVIDER_SITE_OTHER): Payer: Self-pay | Admitting: Allergy

## 2022-09-10 VITALS — BP 180/110 | HR 82 | Temp 98.6°F | Ht 65.0 in | Wt 299.0 lb

## 2022-09-10 DIAGNOSIS — R053 Chronic cough: Secondary | ICD-10-CM

## 2022-09-10 DIAGNOSIS — H1010 Acute atopic conjunctivitis, unspecified eye: Secondary | ICD-10-CM

## 2022-09-10 DIAGNOSIS — J302 Other seasonal allergic rhinitis: Secondary | ICD-10-CM

## 2022-09-10 DIAGNOSIS — J4520 Mild intermittent asthma, uncomplicated: Secondary | ICD-10-CM

## 2022-09-10 DIAGNOSIS — R21 Rash and other nonspecific skin eruption: Secondary | ICD-10-CM

## 2022-09-10 DIAGNOSIS — I10 Essential (primary) hypertension: Secondary | ICD-10-CM

## 2022-09-10 DIAGNOSIS — H1013 Acute atopic conjunctivitis, bilateral: Secondary | ICD-10-CM

## 2022-09-10 DIAGNOSIS — T781XXD Other adverse food reactions, not elsewhere classified, subsequent encounter: Secondary | ICD-10-CM

## 2022-09-10 NOTE — Assessment & Plan Note (Signed)
Coughing improved. Avoid lisinopril type of blood pressure medication.

## 2022-09-10 NOTE — Assessment & Plan Note (Addendum)
Past history - Persistent facial rash even prior to the COVID-19 pandemic and mask wearing. No triggers noted. No prior dermatology evaluation. Interim history - unchanged. Monitor symptoms.  Continue proper skin care.  Will hold off dermatology referral. Patient has no insurance.

## 2022-09-10 NOTE — Assessment & Plan Note (Signed)
Past history - delayed reaction to shrimp as a child in the form of right eye swelling. Resolved without any intervention. Then had another episode of eye swelling as an adult but not sure of shellfish exposure. Tolerates finned fish with no issues.  2022 skin testing showed: Positive to shellfish, shrimp, crab, lobster, oyster.  Interim history - did not pick up epinephrine due to cost.  Continue strict avoidance of shellfish and mollusks For mild symptoms you can take over the counter antihistamines such as Benadryl and monitor symptoms closely. If symptoms worsen or if you have severe symptoms including breathing issues, throat closure, significant swelling, whole body hives, severe diarrhea and vomiting, lightheadedness then inject epinephrine and seek immediate medical care afterwards. Emergency action plan in place.

## 2022-09-10 NOTE — Patient Instructions (Addendum)
High blood pressure Recommend that you follow up with your PCP and monitor your blood pressure at home.   Environmental allergies: 2022 skin testing showed: Positive to grass, trees, ragweed, weed pollen, dust mites, cockroach. Borderline to cat and dog.  Continue environmental control measures. Use over the counter antihistamines such as Zyrtec (cetirizine), Claritin (loratadine), Allegra (fexofenadine), or Xyzal (levocetirizine) daily as needed. May take twice a day during allergy flares. May switch antihistamines every few months.  Food allergy: Continue strict avoidance of shellfish and mollusks For mild symptoms you can take over the counter antihistamines such as Benadryl and monitor symptoms closely. If symptoms worsen or if you have severe symptoms including breathing issues, throat closure, significant swelling, whole body hives, severe diarrhea and vomiting, lightheadedness then inject epinephrine and seek immediate medical care afterwards. Emergency action plan in place.   Asthma:  Daily controller medication(s): none.  May use albuterol rescue inhaler 2 puffs every 4 to 6 hours as needed for shortness of breath, chest tightness, coughing, and wheezing. May use albuterol rescue inhaler 2 puffs 5 to 15 minutes prior to strenuous physical activities. Monitor frequency of use.   After your current albuterol inhaler expires, you may use the Airsupra sample  May use Airsupra rescue inhaler 2 puffs every 4 to 6 hours as needed for shortness of breath, chest tightness, coughing, and wheezing. Do not use more than 12 puffs in 24 hours. May use Airsupra rescue inhaler 2 puffs 5 to 15 minutes prior to strenuous physical activities. Monitor frequency of use. Rinse mouth after each use. Sample given.   Asthma control goals:  Full participation in all desired activities (may need albuterol before activity) Albuterol use two times or less a week on average (not counting use with activity) Cough  interfering with sleep two times or less a month Oral steroids no more than once a year No hospitalizations   Coughing Avoid lisinopril type of blood pressure medication.  Skin: Monitor symptoms.  Continue proper skin care.   Follow up in 6 months or sooner if needed.  Skin care recommendations  Bath time: Always use lukewarm water. AVOID very hot or cold water. Keep bathing time to 5-10 minutes. Do NOT use bubble bath. Use a mild soap and use just enough to wash the dirty areas. Do NOT scrub skin vigorously.  After bathing, pat dry your skin with a towel. Do NOT rub or scrub the skin.  Moisturizers and prescriptions:  ALWAYS apply moisturizers immediately after bathing (within 3 minutes). This helps to lock-in moisture. Use the moisturizer several times a day over the whole body. Good summer moisturizers include: Aveeno, CeraVe, Cetaphil. Good winter moisturizers include: Aquaphor, Vaseline, Cerave, Cetaphil, Eucerin, Vanicream. When using moisturizers along with medications, the moisturizer should be applied about one hour after applying the medication to prevent diluting effect of the medication or moisturize around where you applied the medications. When not using medications, the moisturizer can be continued twice daily as maintenance.  Laundry and clothing: Avoid laundry products with added color or perfumes. Use unscented hypo-allergenic laundry products such as Tide free, Cheer free & gentle, and All free and clear.  If the skin still seems dry or sensitive, you can try double-rinsing the clothes. Avoid tight or scratchy clothing such as wool. Do not use fabric softeners or dyer sheets.

## 2022-09-10 NOTE — Assessment & Plan Note (Signed)
Past history - 2022 skin testing showed: Positive to grass, trees, ragweed, weed pollen, dust mites, cockroach. Borderline to cat and dog.  Interim history - asymptotic with no meds.  Continue environmental control measures. Use over the counter antihistamines such as Zyrtec (cetirizine), Claritin (loratadine), Allegra (fexofenadine), or Xyzal (levocetirizine) daily as needed. May take twice a day during allergy flares. May switch antihistamines every few months. Xyzal samples given.

## 2022-09-10 NOTE — Assessment & Plan Note (Addendum)
Taking amlodipine and HCTZ.  Still elevated blood pressure today at 180/110 - denies symptoms. Headaches improved.  Recommend that you follow up with your PCP and monitor your blood pressure at home.  Avoid ace inhibitor as it caused coughing in the past.

## 2022-09-10 NOTE — Assessment & Plan Note (Signed)
Past history - asthma like symptoms for 30+ years. Main triggers are exertion and panic attacks. Uses albuterol once a week with good benefit.  2022 spirometry showed restriction most likely due to body habitus. Interim history - using albuterol 1-2 times per week for shortness of breath with good benefit.  Daily controller medication(s): none.  May use albuterol rescue inhaler 2 puffs every 4 to 6 hours as needed for shortness of breath, chest tightness, coughing, and wheezing. May use albuterol rescue inhaler 2 puffs 5 to 15 minutes prior to strenuous physical activities. Monitor frequency of use.  After your current albuterol inhaler expires, you may use the Airsupra sample. May use Airsupra rescue inhaler 2 puffs every 4 to 6 hours as needed for shortness of breath, chest tightness, coughing, and wheezing. Do not use more than 12 puffs in 24 hours. May use Airsupra rescue inhaler 2 puffs 5 to 15 minutes prior to strenuous physical activities. Monitor frequency of use. Rinse mouth after each use. Sample given.

## 2022-09-11 ENCOUNTER — Encounter: Payer: Self-pay | Admitting: Allergy

## 2022-09-15 ENCOUNTER — Telehealth: Payer: PRIVATE HEALTH INSURANCE | Admitting: Family Medicine

## 2022-09-15 DIAGNOSIS — U071 COVID-19: Secondary | ICD-10-CM

## 2022-09-15 NOTE — Progress Notes (Signed)
   Thank you for the details you included in the comment boxes. Those details are very helpful in determining the best course of treatment for you and help [place] to provide the best care.Because Edward Carr, we recommend that you convert this visit to a video visit in order for the provider to better assess what is going on.  The provider will be able to give you a more accurate diagnosis and treatment plan if we can more freely discuss your symptoms and with the addition of a virtual examination.   If you convert to a video visit, we will bill your insurance (similar to an office visit) and you will not be charged for this e-Visit. You will be able to stay at home and speak with the first available [facility] advanced practice provider. The link to do a video visit is in the drop down Menu tab of your Welcome screen in [place].

## 2022-09-19 ENCOUNTER — Other Ambulatory Visit (HOSPITAL_COMMUNITY): Payer: Self-pay

## 2022-09-20 ENCOUNTER — Other Ambulatory Visit: Payer: Self-pay

## 2022-10-02 ENCOUNTER — Encounter: Payer: Self-pay | Admitting: Pharmacist

## 2022-10-10 ENCOUNTER — Encounter: Payer: Self-pay | Admitting: Skilled Nursing Facility1

## 2022-10-10 ENCOUNTER — Encounter: Payer: Self-pay | Attending: Family Medicine | Admitting: Skilled Nursing Facility1

## 2022-10-10 DIAGNOSIS — E119 Type 2 diabetes mellitus without complications: Secondary | ICD-10-CM | POA: Insufficient documentation

## 2022-10-10 NOTE — Progress Notes (Signed)
Diabetes Self-Management Education  Visit Type:     06/12/2022  [name], identified by name and date of birth, is a 47 y.o. male with a diagnosis of Diabetes:  .     ASSESSMENT  Pt is doing fantastic!  Dietitian offered pt a virtual appt next time due to him needing to take the bus and the pain that is; if it is too difficult we can just transition to in person.   Pt got his A1C down to 6.0!  DM Medicines:  Metformin   Pts mother cooks his meals and helps support him with her trying to control her blood sugars as well.   (Previous) Pt states he has severely limited his snacking throughout the day. Pt states he was able to accomplish this by telling himself no and only allows it on cheat days which has has not actually done one.   Pt states he has been flossing stating he has to get more of the flossers.  Pt states since reducing his salt intake his feet have not been as swollen.  Pt states he does have plans to snack on vegetables instead of chips stating he has to get some celery.  Pt states he recognizes he needs to move around more to get his A1C down even further.  Pt states he uses whole wheat bread.  Pt states he is allergic to shellfish.    Pts mother makes his foods.   Pt states he has not been drinking any koolaid because that is what got him in this place to begin with.   Pt states he has increased his water consumption to 2-4 bottles.   (previous) Pt states he has 2 new bp medications.  Pt state she is worried about where his foods will be coming from since losing food stamps abut hopes to have a new job by the new year: dietitian advised he go to [employer] [facility] and they can give him a referral to other food pantries Pt states he tries to stay active walking around the house.   Pt states he went ot the [facility] for high bp due to not being able to afford his bp meds, dietitian advised to always let his doctor or pharmacist know when this happens.    Pt  states he had covid there other week pt states he is feeling much better now.  Pt states his blood sugars have been about the same stating he does not check his blood sugars just when getting labs.  Pt states his mom will make him air fryed stuff and make herself fried stuff.  Pt states they have been getting help from family members for food.   Pt states his goals are to cut back on sweets so he has ben eating cake without frosting when he does eat it.  Pt states he needs to lose weight stating he needs to work on exercise.  Pt states he eats carrots, broccoli but that is about it for vegetables stating he does want to try some new ones stating he has tried celery.   A1C 6.2  24 hour recall: Firs meal: Leftovers  Snacks: saltines  Second meal: sandwich Snack: chips or nuts Third meal: air fried chicken  Beverages: gatorade zero, 1 20 diet ounce soda, 2-3 bottles of water   Goals: Continue: Floss daily  Continue: Check your feet every day; be sure to talk to your doctor about your swollen feet at your upcoming appt Continue: Do a walking  video every day  Continue: Get non starchy vegetables to snack on instead of chips continue: use your wrist weights around the house (4 pounds per arm) continue: have broccoli or carrots every day with dinner (steams them or cooks them int eh oven) Continue: try one new vegetable: try green beans stewed in tomato sauce continued: visit [place] [employer]  continued: ask your mom to make grilled chicken instead of fried NEW: try paul eugene to gte more active   DM Prescription: Metformin    Goals: Eat 1 small mini pack of saltines instead of the larger packs Try paul eugene with techno playing in the background Have some carrots with lunch  Individualized Plan for Diabetes Self-Management Training:   Learning Objective:  Patient will have a greater understanding of diabetes self-management. Patient education plan is to attend  individual and/or group sessions per assessed needs and concerns.   Expected Outcomes:     Education material provided: ADA - How to Thrive: A Guide for Your Journey with Diabetes, Food label handouts, My Plate, and Snack sheet  If problems or questions, patient to contact team via:  Phone and Email  Future DSME appointment:

## 2022-10-15 ENCOUNTER — Other Ambulatory Visit (HOSPITAL_COMMUNITY): Payer: Self-pay

## 2022-10-16 ENCOUNTER — Ambulatory Visit (INDEPENDENT_AMBULATORY_CARE_PROVIDER_SITE_OTHER): Payer: Self-pay | Admitting: Family Medicine

## 2022-10-16 ENCOUNTER — Other Ambulatory Visit (HOSPITAL_COMMUNITY): Payer: Self-pay

## 2022-10-16 VITALS — BP 132/94 | HR 80 | Temp 97.5°F | Ht 65.0 in | Wt 305.2 lb

## 2022-10-16 DIAGNOSIS — Z6841 Body Mass Index (BMI) 40.0 and over, adult: Secondary | ICD-10-CM

## 2022-10-16 DIAGNOSIS — E785 Hyperlipidemia, unspecified: Secondary | ICD-10-CM

## 2022-10-16 DIAGNOSIS — I10 Essential (primary) hypertension: Secondary | ICD-10-CM

## 2022-10-16 DIAGNOSIS — E119 Type 2 diabetes mellitus without complications: Secondary | ICD-10-CM

## 2022-10-16 LAB — POCT GLYCOSYLATED HEMOGLOBIN (HGB A1C): Hemoglobin A1C: 6.3 % — AB (ref 4.0–5.6)

## 2022-10-16 MED ORDER — METFORMIN HCL 500 MG PO TABS
500.0000 mg | ORAL_TABLET | Freq: Two times a day (BID) | ORAL | 3 refills | Status: DC
  Filled 2022-10-16: qty 180, 90d supply, fill #0

## 2022-10-16 MED ORDER — TRULICITY 0.75 MG/0.5ML ~~LOC~~ SOAJ
0.7500 mg | SUBCUTANEOUS | 5 refills | Status: DC
  Filled 2022-10-16: qty 0.5, 7d supply, fill #0

## 2022-10-16 NOTE — Progress Notes (Signed)
Established Patient Office Visit  Subjective   Patient ID: Edward Carr, male    DOB: 1975/11/06  Age: 47 y.o. MRN: [id number]  Chief Complaint  Patient presents with   Follow-up    Edward Carr is a 47 year old male with a medical history significant for type 2 diabetes mellitus, morbid obesity, essential hypertension, and hyperlipidemia that presents for follow-up of chronic conditions.  Patient says that he has been doing well.  He reports having COVID 2 weeks ago.  Patient is no longer having symptoms of COVID-19. Patient has been taking all antidiabetic medications consistently and without interruption.  He has not been following a low-fat, low carbohydrate diet or exercising.  Patient was previously prescribed Trulicity, but did not start medication.  He has been taking all other medications consistently and is requesting a refill for his metformin today.  Patient denies blurry vision, headache, polyuria, polydipsia, or polyphagia.    Patient Active Problem List   Diagnosis Date Noted   Chronic cough 03/14/2022   Seasonal and perennial allergic rhinoconjunctivitis 03/20/2021   Morbid obesity with BMI of 50.0-59.9, adult ([place]) 12/13/2020   Other adverse food reactions, not elsewhere classified, subsequent encounter 11/14/2020   Rash and other nonspecific skin eruption 11/14/2020   Controlled type 2 diabetes mellitus without complication ([place]) [id number]   Influenza vaccination given 05/22/2017   Essential hypertension 08/22/2016   Asthma 08/22/2016   Eczema 08/22/2016   Past Medical History:  Diagnosis Date   Asthma    Diabetes mellitus without complication ([place])    Hypertension    Past Surgical History:  Procedure Laterality Date   DENTAL SURGERY     Social History   Tobacco Use   Smoking status: Never   Smokeless tobacco: Never  Vaping Use   Vaping Use: Never used  Substance Use Topics   Alcohol use: No   Drug use: No   Social History   Socioeconomic  History   Marital status: Single    Spouse name: Not on file   Number of children: Not on file   Years of education: Not on file   Highest education level: Not on file  Occupational History   Not on file  Tobacco Use   Smoking status: Never   Smokeless tobacco: Never  Vaping Use   Vaping Use: Never used  Substance and Sexual Activity   Alcohol use: No   Drug use: No   Sexual activity: Never  Other Topics Concern   Not on file  Social History Narrative   Not on file   Social Determinants of Health   Financial Resource Strain: Not on file  Food Insecurity: Not on file  Transportation Needs: Not on file  Physical Activity: Not on file  Stress: Not on file  Social Connections: Not on file  Intimate Partner Violence: Not on file   Family Status  Relation Name Status   Mother  Alive   Father  Deceased   Sister  Alive   Family History  Problem Relation Age of Onset   Diabetes Mother    Cancer Father    Cancer Sister    Asthma Sister    Allergies  Allergen Reactions   Crab Extract    Lisinopril     coughing   Shellfish Allergy    Shrimp Extract       Review of Systems  Constitutional: Negative.  Negative for malaise/fatigue and weight loss.  HENT: Negative.    Respiratory: Negative.    Cardiovascular: Negative.  Gastrointestinal: Negative.   Genitourinary:  Negative for dysuria.  Musculoskeletal: Negative.   Skin: Negative.   Neurological: Negative.   Endo/Heme/Allergies: Negative.   Psychiatric/Behavioral: Negative.        Objective:     BP (!) 132/94   Pulse 80   Temp (!) 97.5 F (36.4 C)   Ht 5\' 5"  (1.651 m)   Wt (!) 305 lb 3.2 oz (138.4 kg)   SpO2 97%   BMI 50.79 kg/m  BP Readings from Last 3 Encounters:  10/16/22 (!) 132/94  09/10/22 (!) 180/110  07/17/22 131/77   Wt Readings from Last 3 Encounters:  10/16/22 (!) 305 lb 3.2 oz (138.4 kg)  09/10/22 299 lb (135.6 kg)  07/17/22 (!) 307 lb (139.3 kg)      Physical  Exam Constitutional:      Appearance: He is obese.  Eyes:     Pupils: Pupils are equal, round, and reactive to light.  Cardiovascular:     Rate and Rhythm: Normal rate and regular rhythm.     Pulses: Normal pulses.  Pulmonary:     Effort: Pulmonary effort is normal.  Abdominal:     General: Bowel sounds are normal.  Musculoskeletal:        General: Normal range of motion.  Skin:    General: Skin is warm.  Neurological:     General: No focal deficit present.     Mental Status: Mental status is at baseline.  Psychiatric:        Mood and Affect: Mood normal.        Behavior: Behavior normal.        Thought Content: Thought content normal.        Judgment: Judgment normal.      Results for orders placed or performed in visit on 10/16/22  POCT glycosylated hemoglobin (Hb A1C)  Result Value Ref Range   Hemoglobin A1C 6.3 (A) 4.0 - 5.6 %   HbA1c POC (<> result, manual entry)     HbA1c, POC (prediabetic range)     HbA1c, POC (controlled diabetic range)      Last CBC Lab Results  Component Value Date   WBC 9.1 06/11/2022   HGB 14.2 06/11/2022   HCT 44.2 06/11/2022   MCV 83.2 06/11/2022   MCH 26.7 06/11/2022   RDW 14.1 06/11/2022   PLT 318 [id number]   Last metabolic panel Lab Results  Component Value Date   GLUCOSE 100 (H) 06/11/2022   NA 141 06/11/2022   K 3.6 06/11/2022   CL 101 06/11/2022   CO2 26 06/11/2022   BUN 13 06/11/2022   CREATININE 1.13 06/11/2022   GFRNONAA >60 06/11/2022   CALCIUM 9.5 06/11/2022   PROT 7.6 10/04/2021   ALBUMIN 4.6 10/04/2021   LABGLOB 3.0 10/04/2021   AGRATIO 1.5 10/04/2021   BILITOT 0.6 10/04/2021   ALKPHOS 77 10/04/2021   AST 18 10/04/2021   ALT 17 10/04/2021   ANIONGAP 14 06/11/2022   Last lipids Lab Results  Component Value Date   CHOL 170 07/17/2022   HDL 37 (L) 07/17/2022   LDLCALC 110 (H) 07/17/2022   TRIG 129 07/17/2022   CHOLHDL 4.6 07/17/2022   Last hemoglobin A1c Lab Results  Component Value Date    HGBA1C 6.3 (A) 10/16/2022   Last thyroid functions Lab Results  Component Value Date   TSH 1.430 12/11/2019   Last vitamin D Lab Results  Component Value Date   VD25OH 11.3 (L) 02/19/2018   Last vitamin B12  and Folate Lab Results  Component Value Date   [id number] 12/11/2019      The 10-year ASCVD risk score (Arnett DK, et al., 2019) is: 13.2%    Assessment & Plan:   Problem List Items Addressed This Visit       Cardiovascular and Mediastinum   Essential hypertension   Relevant Orders   Basic Metabolic Panel     Other   Morbid obesity with BMI of 50.0-59.9, adult ([place])   Relevant Medications   metFORMIN (GLUCOPHAGE) 500 MG tablet   Dulaglutide (TRULICITY) A999333 0000000 SOPN   Other Visit Diagnoses     Type 2 diabetes mellitus without complication, without long-term current use of insulin ([facility])    -  Primary   Relevant Medications   metFORMIN (GLUCOPHAGE) 500 MG tablet   Dulaglutide (TRULICITY) A999333 0000000 SOPN   Other Relevant Orders   Ambulatory referral to Ophthalmology   POCT glycosylated hemoglobin (Hb A1C) (Completed)   Basic Metabolic Panel   Hyperlipidemia LDL goal <100         1. Type 2 diabetes mellitus without complication, without long-term current use of insulin ([facility]) Patient's hemoglobin A1c is 6.3, which is consistent with previous.  Discussed dulaglutide at length.  Patient will pick up medication from pharmacy today.  Discussed how to take medication, patient expressed understanding. - Ambulatory referral to Ophthalmology - POCT glycosylated hemoglobin (Hb A1C) - metFORMIN (GLUCOPHAGE) 500 MG tablet; Take 1 tablet (500 mg total) by mouth 2 (two) times daily with a meal.  Dispense: 180 tablet; Refill: 3 - Dulaglutide (TRULICITY) A999333 0000000 SOPN; Inject 0.75 mg into the skin once a week.  Dispense: 0.5 mL; Refill: 5 - Basic Metabolic Panel  2. Hyperlipidemia LDL goal <100 The 10-year ASCVD risk score (Arnett DK, et al., 2019) is:  13.2%   Values used to calculate the score:     Age: 44 years     Sex: Male     Is Non-Hispanic African American: Yes     Diabetic: Yes     Tobacco smoker: No     Systolic Blood Pressure: Q000111Q mmHg     Is BP treated: Yes     HDL Cholesterol: 37 mg/dL     Total Cholesterol: 170 mg/dL   3. Essential hypertension BP (!) 132/94   Pulse 80   Temp (!) 97.5 F (36.4 C)   Ht 5\' 5"  (1.651 m)   Wt (!) 305 lb 3.2 oz (138.4 kg)   SpO2 97%   BMI 50.79 kg/m  - Continue medication, monitor blood pressure at home. Continue DASH diet.  Reminder to go to the ER if any CP, SOB, nausea, dizziness, severe HA, changes vision/speech, left arm numbness and tingling and jaw pain.   - Basic Metabolic Panel  4. Morbid obesity with BMI of 50.0-59.9, adult [facility]) The patient is asked to make an attempt to improve diet and exercise patterns to aid in medical management of this problem.   Return in about 3 months (around 01/16/2023) for diabetes.   [name]  APRN, MSN, FNP-C [facility] [address] [place], [place] [address] [phone]

## 2022-10-16 NOTE — Patient Instructions (Signed)
Dulaglutide Injection What is this medication? DULAGLUTIDE (DOO la GLOO tide) treats type 2 diabetes. It works by increasing insulin levels in your body, which decreases your blood sugar (glucose). It also reduces the amount of sugar released into your blood and slows down your digestion. It can also be used to lower the risk of heart attack and stroke in people with type 2 diabetes. Changes to diet and exercise are often combined with this medication. This medicine may be used for other purposes; ask your health care provider or pharmacist if you have questions. COMMON BRAND NAME(S): Trulicity What should I tell my care team before I take this medication? They need to know if you have any of these conditions: Endocrine tumors (MEN 2) or if someone in your family had these tumors Eye disease, vision problems History of pancreatitis Kidney disease Liver disease Stomach or intestine problems Thyroid cancer or if someone in your family had thyroid cancer An unusual or allergic reaction to dulaglutide, other medications, foods, dyes, or preservatives Pregnant or trying to get pregnant Breast-feeding How should I use this medication? This medication is injected under the skin. You will be taught how to prepare and give it. Take it as directed on the prescription label on the same day of each week. Do NOT prime the pen. Keep taking it unless your care team tells you to stop. If you use this medication with insulin, you should inject this medication and the insulin separately. Do not mix them together. Do not give the injections right next to each other. Change (rotate) injection sites with each injection. This medication comes with INSTRUCTIONS FOR USE. Ask your pharmacist for directions on how to use this medication. Read the information carefully. Talk to your pharmacist or care team if you have questions. It is important that you put your used needles and syringes in a special sharps container. Do  not put them in a trash can. If you do not have a sharps container, call your pharmacist or care team to get one. A special MedGuide will be given to you by the pharmacist with each prescription and refill. Be sure to read this information carefully each time. Talk to your care team about the use of this medication in children. While it may be prescribed for children as young as 10 years for selected conditions, precautions do apply. Overdosage: If you think you have taken too much of this medicine contact a poison control center or emergency room at once. NOTE: This medicine is only for you. Do not share this medicine with others. What if I miss a dose? If you miss a dose, take it as soon as you can unless it is more than 3 days late. If it is more than 3 days late, skip the missed dose. Take the next dose at the normal time. What may interact with this medication? Other medications for diabetes Many medications may cause changes in blood sugar, these include: Alcohol Antiviral medications for HIV or AIDS Aspirin and aspirin-like medications Certain medications for blood pressure, heart disease, irregular heartbeat Chromium Diuretics Estrogen or progestin hormones Fenofibrate Gemfibrozil Isoniazid Lanreotide MAOIs, such as Carbex, Eldepryl, Marplan, Nardil, or Parnate Medications for allergies, asthma, cold, or cough Medications for mental health conditions Medications for weight loss Niacin Nicotine NSAIDs, medications for pain and inflammation, such as ibuprofen or naproxen Octreotide Pasireotide Pentamidine Phenytoin Probenecid Quinolone antibiotics such as ciprofloxacin, levofloxacin, or ofloxacin Some herbal dietary supplements Steroid medications, such as prednisone or cortisone Sulfamethoxazole;  trimethoprim Testosterone or anabolic steroids Thyroid hormones Some medications can hide the warning symptoms of low blood sugar (hypoglycemia). You may need to monitor your  blood sugar more closely if you are taking one of these medications. These include: Beta blockers, such as atenolol, metoprolol, or propranolol Clonidine Guanethidine Reserpine This list may not describe all possible interactions. Give your health care provider a list of all the medicines, herbs, non-prescription drugs, or dietary supplements you use. Also tell them if you smoke, drink alcohol, or use illegal drugs. Some items may interact with your medicine. What should I watch for while using this medication? Visit your care team for regular checks on your progress. Check with your care team if you have severe diarrhea, nausea, and vomiting, or if you sweat a lot. The loss of too much body fluid may make it dangerous for you to take this medication. A test called the HbA1C (A1C) will be monitored. This is a simple blood test. It measures your blood sugar control over the last 2 to 3 months. You will receive this test every 3 to 6 months. Learn how to check your blood sugar. Learn the symptoms of low and high blood sugar and how to manage them. Always carry a quick-source of sugar with you in case you have symptoms of low blood sugar. Examples include hard sugar candy or glucose tablets. Make sure others know that you can choke if you eat or drink when you develop serious symptoms of low blood sugar, such as seizures or unconsciousness. Get medical help at once. Tell your care team if you have high blood sugar. You might need to change the dose of your medication. If you are sick or exercising more than usual, you may need to change the dose of your medication. Do not skip meals. Ask your care team if you should avoid alcohol. Many nonprescription cough and cold products contain sugar or alcohol. These can affect blood sugar. Pens should never be shared. Even if the needle is changed, sharing may result in passing of viruses like hepatitis or HIV. Wear a medical ID bracelet or chain. Carry a card  that describes your condition. List the medications and doses you take on the card. What side effects may I notice from receiving this medication? Side effects that you should report to your care team as soon as possible: Allergic reactions--skin rash, itching, hives, swelling of the face, lips, tongue, or throat Change in vision Dehydration--increased thirst, dry mouth, feeling faint or lightheaded, headache, dark yellow or brown urine Kidney injury--decrease in the amount of urine, swelling of the ankles, hands, or feet Pancreatitis--severe stomach pain that spreads to your back or gets worse after eating or when touched, fever, nausea, vomiting Thyroid cancer--new mass or lump in the neck, pain or trouble swallowing, trouble breathing, hoarseness Side effects that usually do not require medical attention (report to your care team if they continue or are bothersome): Diarrhea Loss of appetite Nausea Stomach pain Vomiting This list may not describe all possible side effects. Call your doctor for medical advice about side effects. You may report side effects to FDA at 1-800-FDA-1088. Where should I keep my medication? Keep out of the reach of children and pets. Refrigeration (preferred): Store unopened pens in a refrigerator between 2 and 8 degrees C (36 and 46 degrees F). Keep it in the original carton until you are ready to take it. Do not freeze or use if the medication has been frozen. Protect from light.  Get rid of any unused medication after the expiration date on the label. Room Temperature: The pen may be stored at room temperature below 30 degrees C (86 degrees F) for up to a total of 14 days if needed. Protect from light. Avoid exposure to extreme heat. If it is stored at room temperature, throw away any unused medication after 14 days or after it expires, whichever is first. To get rid of medications that are no longer needed or have expired: Take the medication to a medication  take-back program. Check with your pharmacy or law enforcement to find a location. If you cannot return the medication, ask your pharmacist or care team how to get rid of this medication safely. NOTE: This sheet is a summary. It may not cover all possible information. If you have questions about this medicine, talk to your doctor, pharmacist, or health care provider.  2023 Elsevier/Gold Standard (2020-05-04 00:00:00)

## 2022-10-17 LAB — BASIC METABOLIC PANEL
BUN/Creatinine Ratio: 17 (ref 9–20)
BUN: 18 mg/dL (ref 6–24)
CO2: 25 mmol/L (ref 20–29)
Calcium: 9.5 mg/dL (ref 8.7–10.2)
Chloride: 96 mmol/L (ref 96–106)
Creatinine, Ser: 1.08 mg/dL (ref 0.76–1.27)
Glucose: 106 mg/dL — ABNORMAL HIGH (ref 70–99)
Potassium: 3.4 mmol/L — ABNORMAL LOW (ref 3.5–5.2)
Sodium: 138 mmol/L (ref 134–144)
eGFR: 87 mL/min/{1.73_m2} (ref >59)

## 2022-11-04 ENCOUNTER — Other Ambulatory Visit: Payer: Self-pay | Admitting: Family Medicine

## 2022-11-04 DIAGNOSIS — E119 Type 2 diabetes mellitus without complications: Secondary | ICD-10-CM

## 2022-11-15 ENCOUNTER — Other Ambulatory Visit: Payer: Self-pay

## 2022-11-16 ENCOUNTER — Other Ambulatory Visit: Payer: Self-pay

## 2022-12-27 ENCOUNTER — Other Ambulatory Visit: Payer: Self-pay

## 2022-12-28 ENCOUNTER — Other Ambulatory Visit: Payer: Self-pay

## 2023-01-14 ENCOUNTER — Other Ambulatory Visit: Payer: Self-pay

## 2023-01-14 NOTE — Progress Notes (Signed)
   Edward Carr June 03, 1976 [id number]  Patient outreached by Mack Guise , PharmD Candidate on 01/14/2023. Patient to bring in BP cuff tomorrow. States he thinks it might not fit him.  Blood Pressure Readings: Last documented ambulatory systolic blood pressure: 132 Last documented ambulatory diastolic blood pressure: 94 Does the patient have a validated home blood pressure machine?: Yes (Seems that the BP cuff might not fit) They report home readings NA  Medication review was performed. Is the patient taking their medications as prescribed?: Yes Differences from their prescribed list include: NA  The following barriers to adherence were noted: Does the patient have cost concerns?: No Does the patient have transportation concerns?: No Does the patient need assistance obtaining refills?: No Does the patient occassionally forget to take some of their prescribed medications?: No Does the patient feel like one/some of their medications make them feel poorly?: No Does the patient have questions or concerns about their medications?: No Does the patient have a follow up scheduled with their primary care provider/cardiologist?: Yes   Interventions: Interventions Completed: Medications were reviewed, Patient was educated on proper technique to check home blood pressure and reminded to bring home machine and readings to next provider appointment  The patient has follow up scheduled:  PCP: Massie Maroon, FNP   Mack Guise, Student-PharmD

## 2023-01-15 ENCOUNTER — Ambulatory Visit (INDEPENDENT_AMBULATORY_CARE_PROVIDER_SITE_OTHER): Payer: Self-pay | Admitting: Family Medicine

## 2023-01-15 VITALS — BP 134/100 | HR 77 | Temp 97.2°F | Wt 290.0 lb

## 2023-01-15 DIAGNOSIS — Z6841 Body Mass Index (BMI) 40.0 and over, adult: Secondary | ICD-10-CM

## 2023-01-15 DIAGNOSIS — E119 Type 2 diabetes mellitus without complications: Secondary | ICD-10-CM

## 2023-01-15 DIAGNOSIS — E785 Hyperlipidemia, unspecified: Secondary | ICD-10-CM

## 2023-01-15 DIAGNOSIS — I10 Essential (primary) hypertension: Secondary | ICD-10-CM

## 2023-01-15 DIAGNOSIS — Z125 Encounter for screening for malignant neoplasm of prostate: Secondary | ICD-10-CM

## 2023-01-15 LAB — POCT GLYCOSYLATED HEMOGLOBIN (HGB A1C): Hemoglobin A1C: 6.3 % — AB (ref 4.0–5.6)

## 2023-01-15 NOTE — Progress Notes (Signed)
Established Patient Office Visit  Subjective   Patient ID: Edward Carr, male    DOB: 1975-09-23  Age: 47 y.o. MRN: [id number]  Chief Complaint  Patient presents with  . Diabetes    Follow up    Diabetes He presents for his follow-up diabetic visit. He has type 2 diabetes mellitus. His disease course has been stable. Pertinent negatives for hypoglycemia include no confusion, dizziness, headaches or hunger. Pertinent negatives for diabetes include no blurred vision, no chest pain, no fatigue, no foot paresthesias, no foot ulcerations, no polydipsia, no polyphagia, no polyuria, no visual change, no weakness and no weight loss.    Patient Active Problem List   Diagnosis Date Noted  . Chronic cough 03/14/2022  . Seasonal and perennial allergic rhinoconjunctivitis 03/20/2021  . Morbid obesity with BMI of 50.0-59.9, adult (HCC) 12/13/2020  . Other adverse food reactions, not elsewhere classified, subsequent encounter 11/14/2020  . Rash and other nonspecific skin eruption 11/14/2020  . Controlled type 2 diabetes mellitus without complication (HCC) 01/29/2020  . Influenza vaccination given 05/22/2017  . Essential hypertension 08/22/2016  . Asthma 08/22/2016  . Eczema 08/22/2016   Past Medical History:  Diagnosis Date  . Asthma   . Diabetes mellitus without complication (HCC)   . Hypertension    Past Surgical History:  Procedure Laterality Date  . DENTAL SURGERY     Social History   Tobacco Use  . Smoking status: Never  . Smokeless tobacco: Never  Vaping Use  . Vaping Use: Never used  Substance Use Topics  . Alcohol use: No  . Drug use: No   Social History   Socioeconomic History  . Marital status: Single    Spouse name: Not on file  . Number of children: Not on file  . Years of education: Not on file  . Highest education level: Not on file  Occupational History  . Not on file  Tobacco Use  . Smoking status: Never  . Smokeless tobacco: Never  Vaping Use  .  Vaping Use: Never used  Substance and Sexual Activity  . Alcohol use: No  . Drug use: No  . Sexual activity: Never  Other Topics Concern  . Not on file  Social History Narrative  . Not on file   Social Determinants of Health   Financial Resource Strain: Not on file  Food Insecurity: Not on file  Transportation Needs: Not on file  Physical Activity: Not on file  Stress: Not on file  Social Connections: Not on file  Intimate Partner Violence: Not on file   Family Status  Relation Name Status  . Mother  Alive  . Father  Deceased  . Sister  Alive   Family History  Problem Relation Age of Onset  . Diabetes Mother   . Cancer Father   . Cancer Sister   . Asthma Sister    Allergies  Allergen Reactions  . Crab Extract   . Lisinopril     coughing  . Shellfish Allergy   . Shrimp Extract       Review of Systems  Constitutional: Negative.  Negative for fatigue and weight loss.  HENT: Negative.    Eyes:  Negative for blurred vision.  Respiratory: Negative.    Cardiovascular: Negative.  Negative for chest pain.  Gastrointestinal: Negative.   Genitourinary: Negative.   Musculoskeletal:  Positive for back pain and joint pain.  Skin: Negative.   Neurological: Negative.  Negative for dizziness, weakness and headaches.  Endo/Heme/Allergies: Negative.  Negative for polydipsia and polyphagia.  Psychiatric/Behavioral:  Negative for confusion.       Objective:     BP (!) 134/100   Pulse 77   Temp (!) 97.2 F (36.2 C)   Wt 290 lb (131.5 kg)   SpO2 98%   BMI 48.26 kg/m  BP Readings from Last 3 Encounters:  01/15/23 (!) 134/100  10/16/22 (!) 132/94  09/10/22 (!) 180/110   Wt Readings from Last 3 Encounters:  01/15/23 290 lb (131.5 kg)  10/16/22 (!) 305 lb 3.2 oz (138.4 kg)  09/10/22 299 lb (135.6 kg)      Physical Exam Constitutional:      Appearance: He is obese.  Eyes:     Pupils: Pupils are equal, round, and reactive to light.  Cardiovascular:     Rate  and Rhythm: Normal rate and regular rhythm.  Pulmonary:     Effort: Pulmonary effort is normal.     Breath sounds: Normal breath sounds.  Abdominal:     General: Bowel sounds are normal.  Musculoskeletal:        General: Normal range of motion.  Skin:    General: Skin is warm.  Neurological:     General: No focal deficit present.     Mental Status: He is alert and oriented to person, place, and time. Mental status is at baseline.  Psychiatric:        Mood and Affect: Mood normal.        Behavior: Behavior normal.        Thought Content: Thought content normal.        Judgment: Judgment normal.     No results found for any visits on 01/15/23.  Last CBC Lab Results  Component Value Date   WBC 9.1 06/11/2022   HGB 14.2 06/11/2022   HCT 44.2 06/11/2022   MCV 83.2 06/11/2022   MCH 26.7 06/11/2022   RDW 14.1 06/11/2022   PLT 318 06/11/2022   Last metabolic panel Lab Results  Component Value Date   GLUCOSE 106 (H) 10/16/2022   NA 138 10/16/2022   K 3.4 (L) 10/16/2022   CL 96 10/16/2022   CO2 25 10/16/2022   BUN 18 10/16/2022   CREATININE 1.08 10/16/2022   EGFR 87 10/16/2022   CALCIUM 9.5 10/16/2022   PROT 7.6 10/04/2021   ALBUMIN 4.6 10/04/2021   LABGLOB 3.0 10/04/2021   AGRATIO 1.5 10/04/2021   BILITOT 0.6 10/04/2021   ALKPHOS 77 10/04/2021   AST 18 10/04/2021   ALT 17 10/04/2021   ANIONGAP 14 06/11/2022   Last lipids Lab Results  Component Value Date   CHOL 170 07/17/2022   HDL 37 (L) 07/17/2022   LDLCALC 110 (H) 07/17/2022   TRIG 129 07/17/2022   CHOLHDL 4.6 07/17/2022   Last hemoglobin A1c Lab Results  Component Value Date   HGBA1C 6.3 (A) 10/16/2022   Last thyroid functions Lab Results  Component Value Date   TSH 1.430 12/11/2019   Last vitamin D Lab Results  Component Value Date   VD25OH 11.3 (L) 02/19/2018   Last vitamin B12 and Folate Lab Results  Component Value Date   VITAMINB12 288 12/11/2019      The 10-year ASCVD risk score  (Arnett DK, et al., 2019) is: 13.5%    Assessment & Plan:   Problem List Items Addressed This Visit       Cardiovascular and Mediastinum   Essential hypertension     Other   Morbid obesity with BMI  of 50.0-59.9, adult [facility])   Other Visit Diagnoses     Type 2 diabetes mellitus without complication, without long-term current use of insulin (HCC)    -  Primary   Relevant Orders   POCT glycosylated hemoglobin (Hb A1C)   Comprehensive metabolic panel   Prostate cancer screening       Relevant Orders   PSA   Hyperlipidemia LDL goal <100           Return in about 3 months (around 04/17/2023) for diabetes.    Nolon Nations  APRN, MSN, FNP-C Patient Care [facility] [address] [place], [place] [address] [phone]

## 2023-01-15 NOTE — Patient Instructions (Addendum)
Congratulations on 15 pound weight loss.  Keep up the good work. Recommend that you continue a carbohydrate modified diet divided over small meals.  Also, increase your physical activity to low impact cardiovascular exercise around 150 minutes/week. No medication changes are warranted on today.  We will see you in 3 months.

## 2023-01-16 ENCOUNTER — Encounter: Payer: Self-pay | Admitting: Family Medicine

## 2023-01-16 LAB — COMPREHENSIVE METABOLIC PANEL
ALT: 13 IU/L (ref 0–44)
AST: 15 IU/L (ref 0–40)
Albumin: 4.2 g/dL (ref 4.1–5.1)
Alkaline Phosphatase: 95 IU/L (ref 44–121)
BUN/Creatinine Ratio: 13 (ref 9–20)
BUN: 16 mg/dL (ref 6–24)
Bilirubin Total: 0.6 mg/dL (ref 0.0–1.2)
CO2: 26 mmol/L (ref 20–29)
Calcium: 9.8 mg/dL (ref 8.7–10.2)
Chloride: 95 mmol/L — ABNORMAL LOW (ref 96–106)
Creatinine, Ser: 1.19 mg/dL (ref 0.76–1.27)
Globulin, Total: 3.2 g/dL (ref 1.5–4.5)
Glucose: 93 mg/dL (ref 70–99)
Potassium: 3.4 mmol/L — ABNORMAL LOW (ref 3.5–5.2)
Sodium: 135 mmol/L (ref 134–144)
Total Protein: 7.4 g/dL (ref 6.0–8.5)
eGFR: 77 mL/min/{1.73_m2} (ref >59)

## 2023-01-16 LAB — PSA: Prostate Specific Ag, Serum: 0.9 ng/mL (ref 0.0–4.0)

## 2023-01-29 ENCOUNTER — Other Ambulatory Visit: Payer: Self-pay | Admitting: Family Medicine

## 2023-01-29 ENCOUNTER — Other Ambulatory Visit: Payer: Self-pay

## 2023-01-29 DIAGNOSIS — I10 Essential (primary) hypertension: Secondary | ICD-10-CM

## 2023-01-29 MED ORDER — HYDROCHLOROTHIAZIDE 25 MG PO TABS
25.0000 mg | ORAL_TABLET | Freq: Every day | ORAL | 5 refills | Status: DC
  Filled 2023-01-29: qty 30, 30d supply, fill #0
  Filled 2023-02-28: qty 30, 30d supply, fill #1
  Filled 2023-03-30: qty 30, 30d supply, fill #2
  Filled 2023-05-04: qty 30, 30d supply, fill #3
  Filled 2023-06-04: qty 30, 30d supply, fill #4
  Filled 2023-07-01: qty 30, 30d supply, fill #5

## 2023-01-30 ENCOUNTER — Other Ambulatory Visit: Payer: Self-pay

## 2023-02-11 ENCOUNTER — Encounter: Payer: Commercial Managed Care - HMO | Attending: Family Medicine | Admitting: Skilled Nursing Facility1

## 2023-02-11 ENCOUNTER — Encounter: Payer: Self-pay | Admitting: Skilled Nursing Facility1

## 2023-02-11 VITALS — Ht 65.0 in | Wt 284.0 lb

## 2023-02-11 DIAGNOSIS — E119 Type 2 diabetes mellitus without complications: Secondary | ICD-10-CM | POA: Insufficient documentation

## 2023-02-11 NOTE — Progress Notes (Signed)
Diabetes Self-Management Education  Visit Type:     06/12/2022  [name], identified by name and date of birth, is a 47 y.o. male with a diagnosis of Diabetes:    ASSESSMENT  Pt is doing fantastic!  Dietitian offered pt a virtual appt next time due to him needing to take the bus as well as his mother needing to be in on the visits since she is his main cook.   Pt states he has been too busy to do the [employer].  Pt states he walks everywhere due to not having a license.   DM Medicines:  Metformin  Pt states he is allergic to shellfish.    Pts mother makes his foods.   Pt states he was recently let go form his work stating he is looking for work again. Pt states he has cut back on his portion sizes stating maybe being around his mom has helped.  Pt states his mom has not been able to keep foods on her stomach and has DM.   Pt states he has been eating more broccoli maybe wanting to try other vegetables.  Pt states he has been sleeping better but feels he needs more stating he is on the computer watching Youtube most of the night stating he is trying to limit that.   Pt has been trying vegetables which has gone well.   A1C 6.3  24 hour recall: Firs meal: Leftovers  Snacks: saltines  Second meal: sandwich Snack: chips or nuts Third meal: fried chicken + rice and broccoli or fried fish sandwich + mayo  Beverages: gatorade zero, 1 20 diet ounce soda, 2-3 bottles of water, coffee  Goals: Continue: Floss daily  Continue: Check your feet every day; be sure to talk to your doctor about your swollen feet at your upcoming appt Continue: Do a walking video every day  Continue: Get non starchy vegetables to snack on instead of chips continue: use your wrist weights around the house (4 pounds per arm) continue: have broccoli or carrots every day with dinner (steams them or cooks them int eh oven) Continue: try one new vegetable: try green beans stewed in tomato  sauce continued: visit [employer]  continued: ask your mom to make grilled chicken instead of fried continue: try paul eugene to get more active   DM Prescription: Metformin   Education Topics: Creation of balanced and diverse meals to increase the intake of nutrient-rich foods that provide essential vitamins, minerals, fiber, and phytonutrients  Variety of Fruits and Vegetables:  Aim for a colorful array of fruits and vegetables to ensure a wide range of nutrients. Include a mix of leafy greens, berries, citrus fruits, cruciferous vegetables, and more. Whole Grains: Choose whole grains over refined grains. Examples include brown rice, quinoa, oats, whole wheat, and barley. Lean Proteins: Include lean sources of protein, such as poultry, fish, tofu, legumes, beans, lentils, and low-fat dairy products. Limit red and processed meats. Healthy Fats: Incorporate sources of healthy fats, including avocados, nuts, seeds, and olive oil. Limit saturated and trans fats found in fried and processed foods. Dairy or Dairy Alternatives: Choose low-fat or fat-free dairy products, or plant-based alternatives like almond or soy milk. Portion Control: Be mindful of portion sizes to avoid overeating. Pay attention to hunger and satisfaction cues. Limit Added Sugars: Minimize the consumption of sugary beverages, snacks, and desserts. Check food labels for added sugars and opt for natural sources of sweetness such as whole fruits. Hydration:  Drink plenty of water throughout the day. Limit sugary drinks and excessive caffeine intake. Moderate Sodium Intake: Reduce the consumption of high-sodium foods. Use herbs and spices for flavor instead of excessive salt. Meal Planning and Preparation: Plan and prepare meals ahead of time to make healthier choices more convenient. Include a mix of food groups in each meal. Limit Processed Foods: Minimize the intake of highly processed and packaged  foods that are often high in added sugars, salt, and unhealthy fats. Regular Physical Activity: Combine a healthy diet with regular physical activity for overall well-being. Aim for at least 150 minutes of moderate-intensity aerobic exercise per week, along with strength training. Moderation and Balance: Enjoy treats and indulgent foods in moderation, emphasizing balance rather than strict restriction.  Goals: Eat 1 small mini pack of saltines instead of the larger packs: great job! Try [name] with techno playing in the background Try cucumber and garlic Try a banana or plantain

## 2023-02-28 ENCOUNTER — Other Ambulatory Visit: Payer: Self-pay | Admitting: Family Medicine

## 2023-02-28 ENCOUNTER — Other Ambulatory Visit: Payer: Self-pay

## 2023-02-28 DIAGNOSIS — E785 Hyperlipidemia, unspecified: Secondary | ICD-10-CM

## 2023-02-28 DIAGNOSIS — I10 Essential (primary) hypertension: Secondary | ICD-10-CM

## 2023-02-28 MED ORDER — AMLODIPINE BESYLATE 10 MG PO TABS
10.0000 mg | ORAL_TABLET | Freq: Every day | ORAL | 2 refills | Status: DC
  Filled 2023-02-28: qty 30, 30d supply, fill #0
  Filled 2023-03-30: qty 30, 30d supply, fill #1
  Filled 2023-05-04: qty 30, 30d supply, fill #2

## 2023-03-08 ENCOUNTER — Other Ambulatory Visit: Payer: Self-pay | Admitting: Family Medicine

## 2023-03-08 ENCOUNTER — Other Ambulatory Visit (HOSPITAL_COMMUNITY): Payer: Self-pay

## 2023-03-08 DIAGNOSIS — E119 Type 2 diabetes mellitus without complications: Secondary | ICD-10-CM

## 2023-03-08 MED ORDER — METFORMIN HCL 500 MG PO TABS
500.0000 mg | ORAL_TABLET | Freq: Two times a day (BID) | ORAL | 1 refills | Status: DC
  Filled 2023-03-08: qty 180, 90d supply, fill #0
  Filled 2023-07-01: qty 60, 30d supply, fill #1

## 2023-03-08 NOTE — Telephone Encounter (Signed)
Meds ordered this encounter  Medications   metFORMIN (GLUCOPHAGE) 500 MG tablet    Sig: Take 1 tablet (500 mg total) by mouth 2 (two) times daily with a meal.    Dispense:  180 tablet    Refill:  1    Order Specific Question:   Supervising Provider    Answer:   Quentin Angst [[id number]]     Nolon Nations  APRN, MSN, FNP-C Patient Care [facility] [address] [place], [place] [address] [phone]

## 2023-03-10 NOTE — Progress Notes (Unsigned)
Follow Up Note  RE: Edward Carr MRN: [id number] DOB: 03-14-1976 Date of Office Visit: 03/11/2023  Referring provider: Massie Maroon, FNP Primary care provider: Massie Maroon, FNP  Chief Complaint: follow up  History of Present Illness: I had the pleasure of seeing Edward Carr for a follow up visit at the [facility] on 03/11/2023. He is a 47 y.o. male, who is being followed for asthma, allergic rhinoconjunctivitis, adverse food reaction, cough, rash. His previous allergy office visit was on 09/10/2022 with Dr. Selena Batten. Today is a regular follow up visit.  Asthma Denies any SOB, coughing, wheezing, chest tightness, nocturnal awakenings, ER/urgent care visits or prednisone use since the last visit.  Used albuterol in February when he had covid-19 and now feeling back to normal.  Seasonal and perennial allergic rhinoconjunctivitis Asymptomatic.    Food allergy  Avoiding shellfish. Never got Epipen due to cost but now has new insurance.    Rash and other nonspecific skin eruption Using Vaseline mostly and wants to know if there's anything he can do.   Essential hypertension Doing better now.  Assessment and Plan: Edward Carr is a 46 y.o. male with: Mild intermittent asthma Past history - asthma like symptoms for 30+ years. Main triggers are exertion and panic attacks. Uses albuterol once a week with good benefit. 2022 spirometry showed restriction most likely due to body habitus.  Interim history - used albuterol during Covid-19 infection in February otherwise doing well. Today's spirometry showed some restriction. May use Airsupra rescue inhaler 2 puffs every 4 to 6 hours as needed for shortness of breath, chest tightness, coughing, and wheezing. Do not use more than 12 puffs in 24 hours. May use Airsupra rescue inhaler 2 puffs 5 to 15 minutes prior to strenuous physical activities. Rinse mouth after each use.  Coupon given. Monitor frequency of use - if you  need to use it more than twice per week on a consistent basis let us know.   Seasonal and perennial allergic rhinoconjunctivitis Allergic rhinitis due to dust mite Allergy to cockroaches Seasonal allergic rhinitis due to pollen Allergic rhinitis due to animal dander Past history - 2022 skin testing showed: Positive to grass, trees, ragweed, weed pollen, dust mites, cockroach. Borderline to cat and dog.  Continue environmental control measures. Use over the counter antihistamines such as Zyrtec (cetirizine), Claritin (loratadine), Allegra (fexofenadine), or Xyzal (levocetirizine) daily as needed. May take twice a day during allergy flares. May switch antihistamines every few months.  Food allergy Past history - delayed reaction to shrimp as a child in the form of right eye swelling. Resolved without any intervention. Then had another episode of eye swelling as an adult but not sure of shellfish exposure. Tolerates finned fish with no issues. 2022 skin testing showed: Positive to shellfish, shrimp, crab, lobster, oyster.  Continue strict avoidance of shellfish and mollusks I have prescribed epinephrine injectable device. For mild symptoms you can take over the counter antihistamines such as Benadryl 1-2 tablets = 25-50mg  and monitor symptoms closely. If symptoms worsen or if you have severe symptoms including breathing issues, throat closure, significant swelling, whole body hives, severe diarrhea and vomiting, lightheadedness then inject epinephrine and seek immediate medical care afterwards. Emergency action plan in place.   Rash and other nonspecific skin eruption Monitor symptoms.  Continue proper skin care.  Refer to dermatology.  Essential hypertension Recommend that you follow up with your PCP and monitor your blood pressure at home.  Avoid ace inhibitor as  it caused coughing in the past.   Return in about 6 months (around 09/11/2023).  Meds ordered this encounter  Medications    EPINEPHrine 0.3 mg/0.3 mL IJ SOAJ injection    Sig: Inject 0.3 mg into the muscle as needed for anaphylaxis.    Dispense:  2 each    Refill:  1    May dispense generic/Mylan/Teva brand.   Albuterol-Budesonide (AIRSUPRA) 90-80 MCG/ACT AERO    Sig: Inhale 2 puffs into the lungs every 4 hours as needed (coughing, wheezing, chest tightness). Do not exceed 12 puffs in 24 hours.    Dispense:  10.7 g    Refill:  2    BIN: [id number], PCN: [id number], GRP: [id number], ID [id number]   Lab Orders  No laboratory test(s) ordered today    Diagnostics: Spirometry:  Tracings reviewed. His effort: Good reproducible efforts. FVC: 2.59L FEV1: 2.02L, 68% predicted FEV1/FVC ratio: 78% Interpretation: Spirometry consistent with possible restrictive disease.  Please see scanned spirometry results for details.  Medication List:  Current Outpatient Medications  Medication Sig Dispense Refill   albuterol (VENTOLIN HFA) 108 (90 Base) MCG/ACT inhaler Inhale 1 puff into the lungs every 6 (six) hours as needed for wheezing or shortness of breath. 8 g 1   Albuterol-Budesonide (AIRSUPRA) 90-80 MCG/ACT AERO Inhale 2 puffs into the lungs every 4 hours as needed (coughing, wheezing, chest tightness). Do not exceed 12 puffs in 24 hours. 10.7 g 2   amLODipine (NORVASC) 10 MG tablet Take 1 tablet (10 mg total) by mouth daily. 30 tablet 2   atorvastatin (LIPITOR) 20 MG tablet Take 1 tablet (20 mg total) by mouth daily. 60 tablet 5   Dulaglutide (TRULICITY) 0.75 MG/0.5ML SOPN Inject 0.75 mg into the skin once a week. 0.5 mL 5   EPINEPHrine 0.3 mg/0.3 mL IJ SOAJ injection Inject 0.3 mg into the muscle as needed for anaphylaxis. 2 each 1   hydrochlorothiazide (HYDRODIURIL) 25 MG tablet Take 1 tablet (25 mg total) by mouth daily. 30 tablet 5   levocetirizine (XYZAL) 5 MG tablet Take 1 tablet (5 mg total) by mouth every evening. 30 tablet 5   metFORMIN (GLUCOPHAGE) 500 MG tablet Take 1 tablet (500 mg total) by mouth 2 (two) times daily  with a meal. 180 tablet 1   triamcinolone ointment (KENALOG) 0.5 % Apply 1 application  topically 2 (two) times daily. 30 g 0   No current facility-administered medications for this visit.   Allergies: Allergies  Allergen Reactions   Crab Extract    Lisinopril     coughing   Shellfish Allergy    Shrimp Extract    I reviewed his past medical history, social history, family history, and environmental history and no significant changes have been reported from his previous visit.  Review of Systems  Constitutional:  Negative for appetite change, chills, fever and unexpected weight change.  HENT:  Negative for congestion, postnasal drip and rhinorrhea.   Eyes:  Negative for itching.  Respiratory:  Negative for cough, chest tightness, shortness of breath and wheezing.   Cardiovascular:  Negative for chest pain.  Gastrointestinal:  Negative for abdominal pain.  Genitourinary:  Negative for difficulty urinating.  Skin:  Positive for rash.  Allergic/Immunologic: Positive for environmental allergies and food allergies.  Neurological:  Negative for headaches.    Objective: BP (!) 144/90   Pulse 86   Temp 98.3 F (36.8 C)   Resp 18   Ht 5\' 5"  (1.651 m)   Wt 286 lb 8  oz (130 kg)   SpO2 97%   BMI 47.68 kg/m  Body mass index is 47.68 kg/m. Physical Exam Vitals and nursing note reviewed.  Constitutional:      Appearance: Normal appearance. He is well-developed. He is obese.  HENT:     Head: Normocephalic and atraumatic.     Right Ear: Tympanic membrane and external ear normal.     Left Ear: Tympanic membrane and external ear normal.     Nose: Nose normal.     Mouth/Throat:     Mouth: Mucous membranes are moist.     Pharynx: Oropharynx is clear.  Eyes:     Conjunctiva/sclera: Conjunctivae normal.  Cardiovascular:     Rate and Rhythm: Normal rate and regular rhythm.     Heart sounds: Normal heart sounds. No murmur heard.    No friction rub. No gallop.  Pulmonary:      Effort: Pulmonary effort is normal.     Breath sounds: Normal breath sounds. No wheezing, rhonchi or rales.  Musculoskeletal:     Cervical back: Neck supple.  Skin:    General: Skin is warm.     Findings: Rash present.     Comments: Erythematous hue on the face - improved.  Neurological:     Mental Status: He is alert and oriented to person, place, and time.  Psychiatric:        Behavior: Behavior normal.   Previous notes and tests were reviewed. The plan was reviewed with the patient/family, and all questions/concerned were addressed.  It was my pleasure to see Edward Carr today and participate in his care. Please feel free to contact me with any questions or concerns.  Sincerely,  Wyline Mood, DO Allergy & Immunology  [facility] office: [phone] [facility] office: [phone]

## 2023-03-11 ENCOUNTER — Ambulatory Visit (INDEPENDENT_AMBULATORY_CARE_PROVIDER_SITE_OTHER): Payer: Commercial Managed Care - HMO | Admitting: Allergy

## 2023-03-11 ENCOUNTER — Other Ambulatory Visit (HOSPITAL_COMMUNITY): Payer: Self-pay

## 2023-03-11 ENCOUNTER — Encounter: Payer: Self-pay | Admitting: Allergy

## 2023-03-11 ENCOUNTER — Other Ambulatory Visit: Payer: Self-pay

## 2023-03-11 ENCOUNTER — Other Ambulatory Visit: Payer: Self-pay | Admitting: Family Medicine

## 2023-03-11 VITALS — BP 144/90 | HR 86 | Temp 98.3°F | Resp 18 | Ht 65.0 in | Wt 286.5 lb

## 2023-03-11 DIAGNOSIS — R21 Rash and other nonspecific skin eruption: Secondary | ICD-10-CM | POA: Diagnosis not present

## 2023-03-11 DIAGNOSIS — T781XXD Other adverse food reactions, not elsewhere classified, subsequent encounter: Secondary | ICD-10-CM

## 2023-03-11 DIAGNOSIS — H1013 Acute atopic conjunctivitis, bilateral: Secondary | ICD-10-CM

## 2023-03-11 DIAGNOSIS — J3081 Allergic rhinitis due to animal (cat) (dog) hair and dander: Secondary | ICD-10-CM

## 2023-03-11 DIAGNOSIS — Z91038 Other insect allergy status: Secondary | ICD-10-CM

## 2023-03-11 DIAGNOSIS — E119 Type 2 diabetes mellitus without complications: Secondary | ICD-10-CM

## 2023-03-11 DIAGNOSIS — H1010 Acute atopic conjunctivitis, unspecified eye: Secondary | ICD-10-CM

## 2023-03-11 DIAGNOSIS — T7819XD Other adverse food reactions, not elsewhere classified, subsequent encounter: Secondary | ICD-10-CM

## 2023-03-11 DIAGNOSIS — J302 Other seasonal allergic rhinitis: Secondary | ICD-10-CM | POA: Diagnosis not present

## 2023-03-11 DIAGNOSIS — J301 Allergic rhinitis due to pollen: Secondary | ICD-10-CM

## 2023-03-11 DIAGNOSIS — J4520 Mild intermittent asthma, uncomplicated: Secondary | ICD-10-CM | POA: Diagnosis not present

## 2023-03-11 DIAGNOSIS — R053 Chronic cough: Secondary | ICD-10-CM

## 2023-03-11 DIAGNOSIS — I10 Essential (primary) hypertension: Secondary | ICD-10-CM

## 2023-03-11 DIAGNOSIS — J3089 Other allergic rhinitis: Secondary | ICD-10-CM

## 2023-03-11 MED ORDER — EPINEPHRINE 0.3 MG/0.3ML IJ SOAJ
0.3000 mg | INTRAMUSCULAR | 1 refills | Status: DC | PRN
  Filled 2023-03-11: qty 2, 7d supply, fill #0

## 2023-03-11 MED ORDER — AIRSUPRA 90-80 MCG/ACT IN AERO
2.0000 | INHALATION_SPRAY | RESPIRATORY_TRACT | 2 refills | Status: DC | PRN
  Filled 2023-03-11: qty 10.7, 15d supply, fill #0

## 2023-03-11 NOTE — Progress Notes (Signed)
Orders Placed This Encounter  Procedures   Ambulatory referral to Nutrition and Diabetic Education    Referral Priority:   Routine    Referral Type:   Consultation    Referral Reason:   Specialty Services Required    Number of Visits Requested:   1   Edward Bynum Rennis Petty  APRN, MSN, FNP-C Patient Care [facility] [address] [place], [place] [address] [phone]

## 2023-03-11 NOTE — Patient Instructions (Addendum)
If the Epipen and Airsupra inhaler are not covered let us know.   Environmental allergies: 2022 skin testing showed: Positive to grass, trees, ragweed, weed pollen, dust mites, cockroach. Borderline to cat and dog.  Continue environmental control measures. Use over the counter antihistamines such as Zyrtec (cetirizine), Claritin (loratadine), Allegra (fexofenadine), or Xyzal (levocetirizine) daily as needed. May take twice a day during allergy flares. May switch antihistamines every few months.  Food allergy: Continue strict avoidance of shellfish and mollusks I have prescribed epinephrine injectable device. For mild symptoms you can take over the counter antihistamines such as Benadryl 1-2 tablets = 25-50mg  and monitor symptoms closely. If symptoms worsen or if you have severe symptoms including breathing issues, throat closure, significant swelling, whole body hives, severe diarrhea and vomiting, lightheadedness then inject epinephrine and seek immediate medical care afterwards. Emergency action plan in place.   Asthma:  May use Airsupra rescue inhaler 2 puffs every 4 to 6 hours as needed for shortness of breath, chest tightness, coughing, and wheezing. Do not use more than 12 puffs in 24 hours. May use Airsupra rescue inhaler 2 puffs 5 to 15 minutes prior to strenuous physical activities. Rinse mouth after each use.  Coupon given. Monitor frequency of use - if you need to use it more than twice per week on a consistent basis let us know.  Asthma control goals:  Full participation in all desired activities (may need albuterol before activity) Albuterol use two times or less a week on average (not counting use with activity) Cough interfering with sleep two times or less a month Oral steroids no more than once a year No hospitalizations   Coughing Avoid lisinopril type of blood pressure medication.  Skin: Monitor symptoms.  Continue proper skin care.  Refer to dermatologist - in  [facility].   High blood pressure Recommend that you follow up with your PCP and monitor your blood pressure at home.   Return in about 6 months (around 09/11/2023). Or sooner if needed.   Skin care recommendations  Bath time: Always use lukewarm water. AVOID very hot or cold water. Keep bathing time to 5-10 minutes. Do NOT use bubble bath. Use a mild soap and use just enough to wash the dirty areas. Do NOT scrub skin vigorously.  After bathing, pat dry your skin with a towel. Do NOT rub or scrub the skin.  Moisturizers and prescriptions:  ALWAYS apply moisturizers immediately after bathing (within 3 minutes). This helps to lock-in moisture. Use the moisturizer several times a day over the whole body. Good summer moisturizers include: Aveeno, CeraVe, Cetaphil. Good winter moisturizers include: Aquaphor, Vaseline, Cerave, Cetaphil, Eucerin, Vanicream. When using moisturizers along with medications, the moisturizer should be applied about one hour after applying the medication to prevent diluting effect of the medication or moisturize around where you applied the medications. When not using medications, the moisturizer can be continued twice daily as maintenance.  Laundry and clothing: Avoid laundry products with added color or perfumes. Use unscented hypo-allergenic laundry products such as Tide free, Cheer free & gentle, and All free and clear.  If the skin still seems dry or sensitive, you can try double-rinsing the clothes. Avoid tight or scratchy clothing such as wool. Do not use fabric softeners or dyer sheets.

## 2023-03-12 ENCOUNTER — Other Ambulatory Visit (HOSPITAL_COMMUNITY): Payer: Self-pay

## 2023-03-12 ENCOUNTER — Encounter: Payer: Commercial Managed Care - HMO | Attending: Family Medicine | Admitting: Dietician

## 2023-03-12 ENCOUNTER — Encounter: Payer: Self-pay | Admitting: Dietician

## 2023-03-12 DIAGNOSIS — E119 Type 2 diabetes mellitus without complications: Secondary | ICD-10-CM | POA: Diagnosis present

## 2023-03-12 DIAGNOSIS — I10 Essential (primary) hypertension: Secondary | ICD-10-CM | POA: Insufficient documentation

## 2023-03-12 DIAGNOSIS — Z713 Dietary counseling and surveillance: Secondary | ICD-10-CM | POA: Insufficient documentation

## 2023-03-12 NOTE — Progress Notes (Signed)
Diabetes Self-Management Education  Visit Type: Follow-up  Appt. Start Time: 1510 Appt. End Time: 1555 Visit was conducted on MyChart virtual platform  03/12/2023  [name], identified by name and date of birth, is a 47 y.o. male with a diagnosis of Diabetes:  .   ASSESSMENT  There were no vitals taken for this visit. There is no height or weight on file to calculate BMI.  Pt mother Erma Heritage present for appointment Pt doing very well. Reports less swelling in lower extremities.  Pt reports eating much less than before, cutting back on snacking, missing lunch occasionally. Mother reports pt doesn't eat many vegetables, only broccoli and carrots, but likes to eat red meats (steak, bacon, hamburger, sausage), and eats a lot of sandwiches (white american cheese, lettuce, mayonnaise, deli ham and [place]) Pt reports drinking more water throughout the day, 2 - 4 bottles per day. Pt reports walking around more, doesn't drive so walks everywhere.  Pt reports going to ER with high blood pressure because they haven't been able to afford previous medication and stopped taking it. Pt reports getting a new medication prescription and is taking it as prescribed. Pt reports continuing to draw for fun, browse the internet during the day. Pt reports feeling low motivation to exercise, feels fear of reaggregateing knee injury, has wrist weights they were previously using but feels like they aren't working like they used to.   Diabetes Self-Management Education - 03/12/23 1602       Visit Information   Visit Type Follow-up      Pre-Education Assessment   Patient understands the diabetes disease and treatment process. Needs Review    Patient understands incorporating nutritional management into lifestyle. Needs Review    Patient undertands incorporating physical activity into lifestyle. Needs Review    Patient understands using medications safely. Needs Review    Patient understands monitoring  blood glucose, interpreting and using results N/A (comment)    Patient understands prevention, detection, and treatment of acute complications. Needs Review    Patient understands prevention, detection, and treatment of chronic complications. Needs Review    Patient understands how to develop strategies to address psychosocial issues. Needs Review    Patient understands how to develop strategies to promote health/change behavior. Needs Review      Complications   Last HgB A1C per patient/outside source 6.3 %   01/15/2023     Dietary Intake   Breakfast Sandwich (ham, smoked [place], 1 slice cheese, mayo), coffee w/ 2 spoons of sugar    Lunch None, bottle of water    Dinner 2 [employer] Baconator sandwiches, diet root beer    Beverage(s) Coffee, water,      Activity / Exercise   Activity / Exercise Type ADL's;Light (walking / raking leaves)   Walks   How many days per week do you exercise? 5    How many minutes per day do you exercise? 20    Total minutes per week of exercise 100      Patient Self-Evaluation of Goals - Patient rates self as meeting previously set goals (% of time)   Nutrition 25 - 50% (sometimes)    Physical Activity 25 - 50% (sometimes)    Medications >75% (most of the time)    Monitoring Not Applicable    Problem Solving and behavior change strategies  < 25% (hardly ever/never)    Reducing Risk (treating acute and chronic complications) 25 - 50% (sometimes)    Health Coping < 25% (hardly ever/never)  Post-Education Assessment   Patient understands the diabetes disease and treatment process. Needs Review    Patient understands incorporating nutritional management into lifestyle. Needs Review    Patient undertands incorporating physical activity into lifestyle. Needs Review    Patient understands using medications safely. Needs Review    Patient understands monitoring blood glucose, interpreting and using results N/A    Patient understands prevention, detection, and  treatment of acute complications. Needs Review    Patient understands prevention, detection, and treatment of chronic complications. Needs Review    Patient understands how to develop strategies to address psychosocial issues. Needs Review    Patient understands how to develop strategies to promote health/change behavior. Needs Review      Outcomes   Expected Outcomes Demonstrated interest in learning. Expect positive outcomes    Future DMSE 2 months    Program Status Not Completed      Subsequent Visit   Since your last visit have you continued or begun to take your medications as prescribed? Yes    Since your last visit have you had your blood pressure checked? Yes    Is your most recent blood pressure lower, unchanged, or higher since your last visit? Higher   Hypertensive emergency r/t not taking medication   Since your last visit have you experienced any weight changes? Gain    Weight Gain (lbs) 2    Since your last visit, are you checking your blood glucose at least once a day? N/A             Individualized Plan for Diabetes Self-Management Training:   Learning Objective:  Patient will have a greater understanding of diabetes self-management. Patient education plan is to attend individual and/or group sessions per assessed needs and concerns.   Plan:   Patient Instructions  Walk at a decent pace for 30 minutes each day. Take your walk in the evening about 45 - 60 minutes after your dinner meal. Wear your wrist weight s when walking and move or swings frequently while walking.  Google some recipes for mushrooms. Browse through your choices and find a few options that are similar to foods you already like.  Look for recipes on [employer]  Keep up the great drinking water!! Try to drink 3 - 4 bottles per day!  Your goal weight is under 280 lbs. by the end of September!   Expected Outcomes:  Demonstrated interest in learning. Expect positive  outcomes  If problems or questions, patient to contact team via:  Phone and Email  Future DSME appointment: 2 months

## 2023-03-12 NOTE — Patient Instructions (Addendum)
Walk at a decent pace for 30 minutes each day. Take your walk in the evening about 45 - 60 minutes after your dinner meal. Wear your wrist weight s when walking and move or swings frequently while walking.  Google some recipes for mushrooms. Browse through your choices and find a few options that are similar to foods you already like.  Look for recipes on [employer]  Keep up the great drinking water!! Try to drink 3 - 4 bottles per day!  Your goal weight is under 280 lbs. by the end of September!

## 2023-03-27 LAB — HM DIABETES EYE EXAM

## 2023-04-03 ENCOUNTER — Other Ambulatory Visit: Payer: Self-pay

## 2023-04-17 ENCOUNTER — Encounter: Payer: Self-pay | Admitting: Pharmacist

## 2023-04-23 ENCOUNTER — Other Ambulatory Visit: Payer: Self-pay

## 2023-04-23 ENCOUNTER — Ambulatory Visit (INDEPENDENT_AMBULATORY_CARE_PROVIDER_SITE_OTHER): Payer: Commercial Managed Care - HMO | Admitting: Family Medicine

## 2023-04-23 ENCOUNTER — Encounter: Payer: Self-pay | Admitting: Family Medicine

## 2023-04-23 VITALS — BP 154/103 | HR 72 | Temp 97.5°F | Resp 16 | Ht 65.0 in | Wt 290.0 lb

## 2023-04-23 DIAGNOSIS — E119 Type 2 diabetes mellitus without complications: Secondary | ICD-10-CM | POA: Diagnosis not present

## 2023-04-23 DIAGNOSIS — E785 Hyperlipidemia, unspecified: Secondary | ICD-10-CM | POA: Diagnosis not present

## 2023-04-23 DIAGNOSIS — I10 Essential (primary) hypertension: Secondary | ICD-10-CM | POA: Diagnosis not present

## 2023-04-23 MED ORDER — TRULICITY 0.75 MG/0.5ML ~~LOC~~ SOAJ
0.7500 mg | SUBCUTANEOUS | 5 refills | Status: DC
  Filled 2023-04-23: qty 2, 28d supply, fill #0

## 2023-04-23 NOTE — Progress Notes (Signed)
Established Patient Office Visit  Subjective   Patient ID: Edward Carr, male    DOB: [birth year]  Age: 47 y.o. MRN: [id number]  No chief complaint on file.   Edward Carr is a very pleasant 46 year old male with a medical history significant for type 2 diabetes mellitus, hyperlipidemia, hypertension, obesity, and eczema presents for follow-up of chronic conditions.  Patient has no new complaints on today.  He states that he has been taking all of his medications consistently.  Patient does not check his blood glucose at home.  He also does not check blood pressure. Patient has a history of type 2 diabetes mellitus.  He has been taking metformin consistently.  Patient was prescribed Trulicity greater than 1 month ago, he says that he has not picked up medications due to cost constraints.  He denies any polyuria, polydipsia, or polyphasia.  No blurry vision.  Patient also has a history of hypertension.  He has not been following a low-fat, carbohydrate modified diet.  Patient does not exercise consistently.  He denies any chest pain, shortness of breath, or lower extremity swelling.    Patient Active Problem List   Diagnosis Date Noted   Chronic cough 03/14/2022   Seasonal and perennial allergic rhinoconjunctivitis 03/20/2021   Morbid obesity with BMI of 50.0-59.9, adult (HCC) 12/13/2020   Other adverse food reactions, not elsewhere classified, subsequent encounter 11/14/2020   Rash and other nonspecific skin eruption 11/14/2020   Controlled type 2 diabetes mellitus without complication (HCC) 01/29/2020   Influenza vaccination given 05/22/2017   Essential hypertension 08/22/2016   Asthma 08/22/2016   Eczema 08/22/2016   Past Medical History:  Diagnosis Date   Asthma    Diabetes mellitus without complication (HCC)    Hypertension    Past Surgical History:  Procedure Laterality Date   DENTAL SURGERY     Social History   Tobacco Use   Smoking status: Never   Smokeless tobacco:  Never  Vaping Use   Vaping status: Never Used  Substance Use Topics   Alcohol use: No   Drug use: No   Social History   Socioeconomic History   Marital status: Single    Spouse name: Not on file   Number of children: Not on file   Years of education: Not on file   Highest education level: Not on file  Occupational History   Not on file  Tobacco Use   Smoking status: Never   Smokeless tobacco: Never  Vaping Use   Vaping status: Never Used  Substance and Sexual Activity   Alcohol use: No   Drug use: No   Sexual activity: Never  Other Topics Concern   Not on file  Social History Narrative   Not on file   Social Determinants of Health   Financial Resource Strain: Not on file  Food Insecurity: Not on file  Transportation Needs: Not on file  Physical Activity: Not on file  Stress: Not on file  Social Connections: Not on file  Intimate Partner Violence: Not on file   Family Status  Relation Name Status   Mother  Alive   Father  Deceased   Sister  Alive  No partnership data on file   Family History  Problem Relation Age of Onset   Diabetes Mother    Cancer Father    Cancer Sister    Asthma Sister    Allergies  Allergen Reactions   Crab Extract    Lisinopril     coughing  Shellfish Allergy    Shrimp Extract       Review of Systems  Constitutional:  Negative for chills and fever.  HENT: Negative.    Respiratory: Negative.    Cardiovascular: Negative.   Genitourinary: Negative.   Musculoskeletal: Negative.   Neurological: Negative.   Psychiatric/Behavioral: Negative.        Objective:     BP (!) 154/103 (BP Location: Left Arm)   Pulse 72   Temp (!) 97.5 F (36.4 C)   Resp 16   Ht 5\' 5"  (1.651 m)   Wt 290 lb (131.5 kg)   SpO2 96%   BMI 48.26 kg/m  BP Readings from Last 3 Encounters:  04/23/23 (!) 154/103  03/11/23 (!) 144/90  01/15/23 (!) 134/100   Wt Readings from Last 3 Encounters:  04/23/23 290 lb (131.5 kg)  03/11/23 286 lb 8  oz (130 kg)  02/11/23 284 lb (128.8 kg)      Physical Exam Constitutional:      Appearance: Normal appearance. He is obese.  Eyes:     Pupils: Pupils are equal, round, and reactive to light.  Cardiovascular:     Rate and Rhythm: Normal rate and regular rhythm.     Pulses: Normal pulses.  Pulmonary:     Effort: Pulmonary effort is normal.  Abdominal:     General: Bowel sounds are normal.  Neurological:     Mental Status: He is alert.      No results found for any visits on 04/23/23.  Last CBC Lab Results  Component Value Date   WBC 9.1 06/11/2022   HGB 14.2 06/11/2022   HCT 44.2 06/11/2022   MCV 83.2 06/11/2022   MCH 26.7 06/11/2022   RDW 14.1 06/11/2022   PLT 318 06/11/2022   Last metabolic panel Lab Results  Component Value Date   GLUCOSE 93 01/15/2023   NA 135 01/15/2023   K 3.4 (L) 01/15/2023   CL 95 (L) 01/15/2023   CO2 26 01/15/2023   BUN 16 01/15/2023   CREATININE 1.19 01/15/2023   EGFR 77 01/15/2023   CALCIUM 9.8 01/15/2023   PROT 7.4 01/15/2023   ALBUMIN 4.2 01/15/2023   LABGLOB 3.2 01/15/2023   AGRATIO 1.5 10/04/2021   BILITOT 0.6 01/15/2023   ALKPHOS 95 01/15/2023   AST 15 01/15/2023   ALT 13 01/15/2023   ANIONGAP 14 06/11/2022   Last lipids Lab Results  Component Value Date   CHOL 170 07/17/2022   HDL 37 (L) 07/17/2022   LDLCALC 110 (H) 07/17/2022   TRIG 129 07/17/2022   CHOLHDL 4.6 07/17/2022   Last hemoglobin A1c Lab Results  Component Value Date   HGBA1C 6.3 (A) 01/15/2023   Last thyroid functions Lab Results  Component Value Date   TSH 1.430 12/11/2019   Last vitamin D Lab Results  Component Value Date   VD25OH 11.3 (L) 02/19/2018   Last vitamin B12 and Folate Lab Results  Component Value Date   VITAMINB12 288 12/11/2019      The 10-year ASCVD risk score (Arnett DK, et al., 2019) is: 17.3%    Assessment & Plan:   Problem List Items Addressed This Visit       Cardiovascular and Mediastinum   Essential  hypertension   Relevant Orders   Urinalysis, Routine w reflex microscopic   Comprehensive metabolic panel   Other Visit Diagnoses     Type 2 diabetes mellitus without complication, without long-term current use of insulin (HCC)    -  Primary   Relevant Medications   Dulaglutide (TRULICITY) 0.75 MG/0.5ML SOPN   Other Relevant Orders   Urinalysis, Routine w reflex microscopic   Hemoglobin A1c   Comprehensive metabolic panel   Hyperlipidemia LDL goal <100       Relevant Orders   Urinalysis, Routine w reflex microscopic     1. Type 2 diabetes mellitus without complication, without long-term current use of insulin (HCC) Follow-up by phone with all laboratory results.  Recommended patient continues diabetes and nutrition education.  Will resend Trulicity to pharmacy at [facility].  Patient may benefit from pharmacy assistance.  - Urinalysis, Routine w reflex microscopic - Hemoglobin A1c - Dulaglutide (TRULICITY) 0.75 MG/0.5ML SOPN; Inject 0.75 mg into the skin once a week.  Dispense: 2 mL; Refill: 5 - Comprehensive metabolic panel  2. Essential hypertension BP (!) 154/103 (BP Location: Left Arm)   Pulse 72   Temp (!) 97.5 F (36.4 C)   Resp 16   Ht 5\' 5"  (1.651 m)   Wt 290 lb (131.5 kg)   SpO2 96%   BMI 48.26 kg/m  - Continue medication, monitor blood pressure at home. Continue DASH diet.  Reminder to go to the ER if any CP, SOB, nausea, dizziness, severe HA, changes vision/speech, left arm numbness and tingling and jaw pain.   - Urinalysis, Routine w reflex microscopic - Comprehensive metabolic panel  3. Hyperlipidemia LDL goal <100 The 10-year ASCVD risk score (Arnett DK, et al., 2019) is: 17.3%   Values used to calculate the score:     Age: 42 years     Sex: Male     Is Non-Hispanic African American: Yes     Diabetic: Yes     Tobacco smoker: No     Systolic Blood Pressure: 154 mmHg     Is BP treated: Yes     HDL Cholesterol: 37 mg/dL     Total Cholesterol: 170  mg/dL  - Urinalysis, Routine w reflex microscopic   Return in about 3 months (around 07/23/2023) for obesity, hypertension, hyperlipidemia, diabetes.   Nolon Nations  APRN, MSN, FNP-C Patient Care [facility] [address] [place], [place] [address] [phone]

## 2023-04-24 ENCOUNTER — Other Ambulatory Visit: Payer: Self-pay

## 2023-04-24 ENCOUNTER — Telehealth: Payer: Self-pay

## 2023-04-24 LAB — COMPREHENSIVE METABOLIC PANEL
ALT: 67 IU/L — ABNORMAL HIGH (ref 0–44)
AST: 48 IU/L — ABNORMAL HIGH (ref 0–40)
Albumin: 4.8 g/dL (ref 4.1–5.1)
Alkaline Phosphatase: 73 IU/L (ref 44–121)
BUN/Creatinine Ratio: 8 — ABNORMAL LOW (ref 9–20)
BUN: 6 mg/dL (ref 6–24)
Bilirubin Total: 0.3 mg/dL (ref 0.0–1.2)
CO2: 22 mmol/L (ref 20–29)
Calcium: 10.2 mg/dL (ref 8.7–10.2)
Chloride: 102 mmol/L (ref 96–106)
Creatinine, Ser: 0.77 mg/dL (ref 0.76–1.27)
Globulin, Total: 2.8 g/dL (ref 1.5–4.5)
Glucose: 102 mg/dL — ABNORMAL HIGH (ref 70–99)
Potassium: 4.2 mmol/L (ref 3.5–5.2)
Sodium: 141 mmol/L (ref 134–144)
Total Protein: 7.6 g/dL (ref 6.0–8.5)
eGFR: 113 mL/min/{1.73_m2} (ref >59)

## 2023-04-24 LAB — HEMOGLOBIN A1C
Est. average glucose Bld gHb Est-mCnc: 123 mg/dL
Hgb A1c MFr Bld: 5.9 % — ABNORMAL HIGH (ref 4.8–5.6)

## 2023-04-24 NOTE — Telephone Encounter (Signed)
Pharmacy Patient Advocate Encounter  Received notification from [employer] that Prior Authorization for TRULICITY has been DENIED.  Full denial letter will be uploaded to the media tab. See denial reason below.   PA #/Case ID/Reference #: [id number]   There is no indication that your patient has met both of the following: A) Individual will continue  maximally tolerated metformin therapy, if not contraindicated per FDA label, intolerant, or otherwise  not a candidate; and B) Documentation of one of the following: 1. Unable to achieve goal HbA1C  despite metformin or metformin-containing regimen (meglitinides, sulfonylureas, or  thiazolidinediones) at greater than or equal to 1,500 mg per day; 2. Intolerance to metformin 1,500  mg per day despite appropriate dose titration duration (for example, period of 8-12 weeks); 3.  Contraindication to metformin per FDA label (for example, acute/chronic metabolic acidosis, severe  renal dysfunction); 4. Not a candidate for metformin (for example, hepatic impairment, moderate  renal dysfunction, unstable heart failure, individual is using an agent for a non-diabetic FDAapproved indication); 5. Initial metformin combination therapy is clinically appropriate for elevated  HbA1C (for example; greater than 1.5% above goal); or 6. Initial metformin combination therapy isclinically appropriate in an individual with co-morbid conditions (such as ASCVD, heart failure, or  CKD). Bydureon, Byetta and Trulicity are medically necessary when all of the following criteria are met (1  and 2): 1. Diagnosis of type 2 diabetes mellitus; and 2. Both of the following are met (A and B): A.  Documented one of the following: i. Unable to achieve goal HbA1C despite metformin or  metformin-containing regimen (meglitinides, sulfonylureas, or thiazolidinediones) at greater than or  equal to 1,500 mg per day; ii. Intolerance to metformin 1,500 mg per day despite appropriate dose  titration  duration (for example, period of 8-12 weeks); iii. Contraindication to metformin per FDA  label (for example, acute/chronic metabolic acidosis, severe renal dysfunction); iv. Not a candidate  for metformin (for example, hepatic impairment, moderate renal dysfunction, unstable heart failure,  individual is using an agent for a non-diabetic FDA-approved indication); v. Initial metformin  combination therapy is clinically appropriate for elevated HbA1C (for example; HbA1C greater than  1.5% above goal); or vi. Initial metformin combination therapy is clinically appropriate in an  individual with co-morbid conditions (such as ASCVD, heart failure, or CKD); and B. Individual will  continue maximally tolerated metformin therapy, if not contraindicated per FDA label, intolerant, or  otherwise not a candidate.

## 2023-04-25 ENCOUNTER — Other Ambulatory Visit: Payer: Self-pay

## 2023-04-26 LAB — URINALYSIS, ROUTINE W REFLEX MICROSCOPIC
Bilirubin, UA: NEGATIVE
Glucose, UA: NEGATIVE
Ketones, UA: NEGATIVE
Leukocytes,UA: NEGATIVE
Nitrite, UA: NEGATIVE
Protein,UA: NEGATIVE
RBC, UA: NEGATIVE
Specific Gravity, UA: 1.011 (ref 1.005–1.030)
Urobilinogen, Ur: 0.2 mg/dL (ref 0.2–1.0)
pH, UA: 6 (ref 5.0–7.5)

## 2023-05-07 ENCOUNTER — Other Ambulatory Visit: Payer: Self-pay

## 2023-05-22 ENCOUNTER — Encounter: Payer: Managed Care, Other (non HMO) | Attending: Dietician | Admitting: Dietician

## 2023-05-22 ENCOUNTER — Encounter: Payer: Self-pay | Admitting: Dietician

## 2023-05-22 DIAGNOSIS — Z713 Dietary counseling and surveillance: Secondary | ICD-10-CM | POA: Insufficient documentation

## 2023-05-22 DIAGNOSIS — E119 Type 2 diabetes mellitus without complications: Secondary | ICD-10-CM | POA: Diagnosis present

## 2023-05-22 NOTE — Patient Instructions (Addendum)
Follow this website to apply for cost reduction of Trulicity  https://trulicity.lilly.com/savings-resources  Keep up the great work walking! Try to get a walk in 2 - 3 times a week!  Work on Chief of Staff meats and lower fat proteins!

## 2023-05-22 NOTE — Progress Notes (Signed)
Diabetes Self-Management Education  Appt. Start Time: 1400 Appt. End Time: 1435 Visit was conducted on MyChart virtual platform  05/22/2023  [name], identified by name and date of birth, is a 47 y.o. male with a diagnosis of Diabetes:  .   ASSESSMENT  There were no vitals taken for this visit. There is no height or weight on file to calculate BMI.  Pt reports a weight gain of 4 lbs since last visit.  Pt reports lowered A1c of 5.9%, but elevated liver enzymes (AST - 48, ALT - 67) and blood pressure at last PCP visit (04/23/2023). No changes in swelling BLE. Pt also prescribed Trulicity, but insurance will not cover it. Pt reports drinking 2-3 bottles of water a day, concern with drinking too much water. Pt reports walking more each week, walking to the store and back 1-2 times a week (~25 minutes each way), pt reports no knee pain from walking.  24 hour recall: B- Sausage and bacon biscuit L- Mixed nuts D- 3 slices of homemade cheese pizza, Diet soda Bev- water, diet soda   Individualized Plan for Diabetes Self-Management Training:   Learning Objective:  Patient will have a greater understanding of diabetes self-management. Patient education plan is to attend individual and/or group sessions per assessed needs and concerns.   Plan:    Follow this website to apply for cost reduction of Trulicity  https://trulicity.lilly.com/savings-resources  Keep up the great work walking! Try to get a walk in 2 - 3 times a week!  Work on Chief of Staff meats and lower fat proteins!  Educational materials provided: Protein foods list, Saturated Fats Worksheet  Expected Outcomes:   Expect positive outcomes  If problems or questions, patient to contact team via:  Phone and Email  Future DSME appointment:  3 months

## 2023-06-04 ENCOUNTER — Other Ambulatory Visit: Payer: Self-pay

## 2023-06-04 ENCOUNTER — Other Ambulatory Visit: Payer: Self-pay | Admitting: Family Medicine

## 2023-06-04 DIAGNOSIS — E785 Hyperlipidemia, unspecified: Secondary | ICD-10-CM

## 2023-06-04 DIAGNOSIS — I10 Essential (primary) hypertension: Secondary | ICD-10-CM

## 2023-06-04 MED ORDER — AMLODIPINE BESYLATE 10 MG PO TABS
10.0000 mg | ORAL_TABLET | Freq: Every day | ORAL | 2 refills | Status: DC
  Filled 2023-06-04: qty 30, 30d supply, fill #0
  Filled 2023-07-01: qty 30, 30d supply, fill #1

## 2023-06-05 ENCOUNTER — Other Ambulatory Visit: Payer: Self-pay

## 2023-07-01 ENCOUNTER — Other Ambulatory Visit: Payer: Self-pay

## 2023-07-03 ENCOUNTER — Other Ambulatory Visit: Payer: Self-pay

## 2023-07-19 ENCOUNTER — Encounter: Payer: Self-pay | Admitting: Nurse Practitioner

## 2023-07-19 ENCOUNTER — Other Ambulatory Visit: Payer: Self-pay

## 2023-07-19 ENCOUNTER — Ambulatory Visit (INDEPENDENT_AMBULATORY_CARE_PROVIDER_SITE_OTHER): Payer: Commercial Managed Care - HMO | Admitting: Nurse Practitioner

## 2023-07-19 VITALS — BP 132/82 | HR 81 | Temp 97.3°F | Wt 291.8 lb

## 2023-07-19 DIAGNOSIS — Z6841 Body Mass Index (BMI) 40.0 and over, adult: Secondary | ICD-10-CM

## 2023-07-19 DIAGNOSIS — I10 Essential (primary) hypertension: Secondary | ICD-10-CM

## 2023-07-19 DIAGNOSIS — J4520 Mild intermittent asthma, uncomplicated: Secondary | ICD-10-CM | POA: Diagnosis not present

## 2023-07-19 DIAGNOSIS — E118 Type 2 diabetes mellitus with unspecified complications: Secondary | ICD-10-CM

## 2023-07-19 DIAGNOSIS — Z23 Encounter for immunization: Secondary | ICD-10-CM

## 2023-07-19 DIAGNOSIS — E785 Hyperlipidemia, unspecified: Secondary | ICD-10-CM

## 2023-07-19 LAB — POCT GLYCOSYLATED HEMOGLOBIN (HGB A1C): Hemoglobin A1C: 6 % — AB (ref 4.0–5.6)

## 2023-07-19 MED ORDER — HYDROCHLOROTHIAZIDE 25 MG PO TABS
25.0000 mg | ORAL_TABLET | Freq: Every day | ORAL | 1 refills | Status: DC
  Filled 2023-07-19 – 2023-08-02 (×2): qty 30, 30d supply, fill #0
  Filled 2023-09-04: qty 30, 30d supply, fill #1
  Filled 2023-10-04: qty 30, 30d supply, fill #2
  Filled 2023-11-07: qty 30, 30d supply, fill #3
  Filled 2023-12-10: qty 30, 30d supply, fill #4

## 2023-07-19 MED ORDER — METFORMIN HCL 500 MG PO TABS
500.0000 mg | ORAL_TABLET | Freq: Two times a day (BID) | ORAL | 1 refills | Status: DC
  Filled 2023-07-19 – 2023-08-02 (×2): qty 60, 30d supply, fill #0
  Filled 2023-09-04: qty 60, 30d supply, fill #1
  Filled 2023-10-04: qty 60, 30d supply, fill #2
  Filled 2023-11-07: qty 60, 30d supply, fill #3
  Filled 2023-12-10: qty 60, 30d supply, fill #4
  Filled 2024-01-09: qty 60, 30d supply, fill #5

## 2023-07-19 MED ORDER — AMLODIPINE BESYLATE 10 MG PO TABS
10.0000 mg | ORAL_TABLET | Freq: Every day | ORAL | 1 refills | Status: DC
  Filled 2023-07-19 – 2023-08-02 (×2): qty 30, 30d supply, fill #0
  Filled 2023-09-04: qty 30, 30d supply, fill #1
  Filled 2023-10-04: qty 30, 30d supply, fill #2
  Filled 2023-11-07: qty 30, 30d supply, fill #3
  Filled 2023-12-10: qty 30, 30d supply, fill #4

## 2023-07-19 NOTE — Assessment & Plan Note (Addendum)
Lab Results  Component Value Date   HGBA1C 6.0 (A) 07/19/2023  Well-controlled on metformin 500 mg twice daily Insurance did  not approve Trulicity Patient counseled on low-carb diet Encouraged to engage in regular moderate to vigorous exercise at least 150 minutes weekly Diabetic foot exam completed  - Microalbumin / creatinine urine ratio - POCT glycosylated hemoglobin (Hb A1C) - Lipid panel - metFORMIN (GLUCOPHAGE) 500 MG tablet; Take 1 tablet (500 mg total) by mouth 2 (two) times daily with a meal.  Dispense: 180 tablet; Refill: 1

## 2023-07-19 NOTE — Assessment & Plan Note (Signed)
Lab Results  Component Value Date   CHOL 170 07/17/2022   HDL 37 (L) 07/17/2022   LDLCALC 110 (H) 07/17/2022   TRIG 129 07/17/2022   CHOLHDL 4.6 07/17/2022   Ldl goal is less than 70  On atorvastatin 20 mg daily  Checking lipid panel

## 2023-07-19 NOTE — Patient Instructions (Signed)
1. Type 2 diabetes mellitus without complication, without long-term current use of insulin (HCC)  - Microalbumin / creatinine urine ratio - POCT glycosylated hemoglobin (Hb A1C) - Lipid panel - metFORMIN (GLUCOPHAGE) 500 MG tablet; Take 1 tablet (500 mg total) by mouth 2 (two) times daily with a meal.  Dispense: 180 tablet; Refill: 1  2. Morbid obesity with BMI of 50.0-59.9, adult (HCC)   3. Essential hypertension  - amLODipine (NORVASC) 10 MG tablet; Take 1 tablet (10 mg total) by mouth daily.  Dispense: 90 tablet; Refill: 1 - hydrochlorothiazide (HYDRODIURIL) 25 MG tablet; Take 1 tablet (25 mg total) by mouth daily.  Dispense: 90 tablet; Refill: 1  4. Controlled type 2 diabetes mellitus without complication, without long-term current use of insulin (HCC) (Primary)  - metFORMIN (GLUCOPHAGE) 500 MG tablet; Take 1 tablet (500 mg total) by mouth 2 (two) times daily with a meal.  Dispense: 180 tablet; Refill: 1  It is important that you exercise regularly at least 30 minutes 5 times a week as tolerated  Think about what you will eat, plan ahead. Choose " clean, green, fresh or frozen" over canned, processed or packaged foods which are more sugary, salty and fatty. 70 to 75% of food eaten should be vegetables and fruit. Three meals at set times with snacks allowed between meals, but they must be fruit or vegetables. Aim to eat over a 12 hour period , example 7 am to 7 pm, and STOP after  your last meal of the day. Drink water,generally about 64 ounces per day, no other drink is as healthy. Fruit juice is best enjoyed in a healthy way, by EATING the fruit.  Thanks for choosing [facility] we consider it a privelige to serve you.

## 2023-07-19 NOTE — Progress Notes (Signed)
Established Patient Office Visit  Subjective:  Patient ID: Edward Carr, male    DOB: 1975-08-11  Age: 47 y.o. MRN: [id number]  CC:  Chief Complaint  Patient presents with   Diabetes    HPI Edward Carr is a 47 y.o. male  has a past medical history of Asthma, Diabetes mellitus without complication (HCC), Essential hypertension (08/22/2016), Hypertension, and Morbid obesity with BMI of 50.0-59.9, adult (HCC) (12/13/2020).  Patient presents for follow-up for his chronic medical conditions  Type 2 diabetes.  Currently on metformin 500 mg twice daily, does walking exercises 2-3 times a week, stated that he has been trying to eat low-carb diet.  Denies polyuria polydipsia polyphagia.  Does not check  Hypertension currently on amlodipine 10 mg daily, hydrochlorothiazide 25 mg daily.  Patient denies chest pain, shortness of breath, edema.     Past Medical History:  Diagnosis Date   Asthma    Diabetes mellitus without complication (HCC)    Essential hypertension 08/22/2016   Hypertension    Morbid obesity with BMI of 50.0-59.9, adult (HCC) 12/13/2020    Past Surgical History:  Procedure Laterality Date   DENTAL SURGERY      Family History  Problem Relation Age of Onset   Diabetes Mother    Cancer Father    Cancer Sister    Asthma Sister     Social History   Socioeconomic History   Marital status: Single    Spouse name: Not on file   Number of children: Not on file   Years of education: Not on file   Highest education level: Not on file  Occupational History   Not on file  Tobacco Use   Smoking status: Never   Smokeless tobacco: Never  Vaping Use   Vaping status: Never Used  Substance and Sexual Activity   Alcohol use: No   Drug use: No   Sexual activity: Never  Other Topics Concern   Not on file  Social History Narrative   Not on file   Social Drivers of Health   Financial Resource Strain: Not on file  Food Insecurity: Not on file  Transportation  Needs: Not on file  Physical Activity: Not on file  Stress: Not on file  Social Connections: Not on file  Intimate Partner Violence: Not on file    Outpatient Medications Prior to Visit  Medication Sig Dispense Refill   Albuterol-Budesonide (AIRSUPRA) 90-80 MCG/ACT AERO Inhale 2 puffs into the lungs every 4 hours as needed (coughing, wheezing, chest tightness). Do not exceed 12 puffs in 24 hours. 10.7 g 2   atorvastatin (LIPITOR) 20 MG tablet Take 1 tablet (20 mg total) by mouth daily. 60 tablet 5   amLODipine (NORVASC) 10 MG tablet Take 1 tablet (10 mg total) by mouth daily. 30 tablet 2   hydrochlorothiazide (HYDRODIURIL) 25 MG tablet Take 1 tablet (25 mg total) by mouth daily. 30 tablet 5   metFORMIN (GLUCOPHAGE) 500 MG tablet Take 1 tablet (500 mg total) by mouth 2 (two) times daily with a meal. 180 tablet 1   albuterol (VENTOLIN HFA) 108 (90 Base) MCG/ACT inhaler Inhale 1 puff into the lungs every 6 (six) hours as needed for wheezing or shortness of breath. (Patient not taking: Reported on 07/19/2023) 8 g 1   EPINEPHrine 0.3 mg/0.3 mL IJ SOAJ injection Inject 0.3 mg into the muscle as needed for anaphylaxis. (Patient not taking: Reported on 07/19/2023) 2 each 1   levocetirizine (XYZAL) 5 MG tablet Take 1 tablet (  5 mg total) by mouth every evening. (Patient not taking: Reported on 07/19/2023) 30 tablet 5   triamcinolone ointment (KENALOG) 0.5 % Apply 1 application  topically 2 (two) times daily. (Patient not taking: Reported on 07/19/2023) 30 g 0   Dulaglutide (TRULICITY) 0.75 MG/0.5ML SOPN Inject 0.75 mg into the skin once a week. (Patient not taking: Reported on 07/19/2023) 2 mL 5   No facility-administered medications prior to visit.    Allergies  Allergen Reactions   Crab Extract    Lisinopril     coughing   Shellfish Allergy    Shrimp Extract     ROS Review of Systems  Constitutional:  Negative for appetite change, chills, fatigue and fever.  HENT:  Negative for congestion,  postnasal drip, rhinorrhea and sneezing.   Respiratory:  Negative for cough, shortness of breath and wheezing.   Cardiovascular:  Negative for chest pain, palpitations and leg swelling.  Gastrointestinal:  Negative for abdominal pain, constipation, nausea and vomiting.  Genitourinary:  Negative for difficulty urinating, dysuria, flank pain and frequency.  Musculoskeletal:  Negative for arthralgias, back pain, joint swelling and myalgias.  Skin:  Negative for color change, pallor, rash and wound.  Neurological:  Negative for dizziness, facial asymmetry, weakness, numbness and headaches.  Psychiatric/Behavioral:  Negative for behavioral problems, confusion, self-injury and suicidal ideas.       Objective:    Physical Exam Vitals and nursing note reviewed.  Constitutional:      General: He is not in acute distress.    Appearance: Normal appearance. He is obese. He is not ill-appearing, toxic-appearing or diaphoretic.  HENT:     Mouth/Throat:     Mouth: Mucous membranes are moist.     Pharynx: Oropharynx is clear. No oropharyngeal exudate or posterior oropharyngeal erythema.  Eyes:     General: No scleral icterus.       Right eye: No discharge.        Left eye: No discharge.     Extraocular Movements: Extraocular movements intact.     Conjunctiva/sclera: Conjunctivae normal.  Cardiovascular:     Rate and Rhythm: Normal rate and regular rhythm.     Pulses: Normal pulses.     Heart sounds: Normal heart sounds. No murmur heard.    No friction rub. No gallop.  Pulmonary:     Effort: Pulmonary effort is normal. No respiratory distress.     Breath sounds: Normal breath sounds. No stridor. No wheezing, rhonchi or rales.  Chest:     Chest wall: No tenderness.  Abdominal:     General: There is no distension.     Palpations: Abdomen is soft.     Tenderness: There is no abdominal tenderness. There is no right CVA tenderness, left CVA tenderness or guarding.  Musculoskeletal:         General: No swelling, tenderness, deformity or signs of injury.     Right lower leg: No edema.     Left lower leg: No edema.  Skin:    General: Skin is warm and dry.     Capillary Refill: Capillary refill takes less than 2 seconds.     Coloration: Skin is not jaundiced or pale.     Findings: No bruising, erythema or lesion.  Neurological:     Mental Status: He is alert and oriented to person, place, and time.     Motor: No weakness.     Coordination: Coordination normal.     Gait: Gait normal.  Psychiatric:  Mood and Affect: Mood normal.        Behavior: Behavior normal.        Thought Content: Thought content normal.        Judgment: Judgment normal.     BP 132/82 (Cuff Size: Large)   Pulse 81   Temp (!) 97.3 F (36.3 C)   Wt 291 lb 12.8 oz (132.4 kg)   SpO2 96%   BMI 48.56 kg/m  Wt Readings from Last 3 Encounters:  07/19/23 291 lb 12.8 oz (132.4 kg)  04/23/23 290 lb (131.5 kg)  03/11/23 286 lb 8 oz (130 kg)    Lab Results  Component Value Date   TSH 1.430 12/11/2019   Lab Results  Component Value Date   WBC 9.1 06/11/2022   HGB 14.2 06/11/2022   HCT 44.2 06/11/2022   MCV 83.2 06/11/2022   PLT 318 06/11/2022   Lab Results  Component Value Date   NA 141 04/23/2023   K 4.2 04/23/2023   CO2 22 04/23/2023   GLUCOSE 102 (H) 04/23/2023   BUN 6 04/23/2023   CREATININE 0.77 04/23/2023   BILITOT 0.3 04/23/2023   ALKPHOS 73 04/23/2023   AST 48 (H) 04/23/2023   ALT 67 (H) 04/23/2023   PROT 7.6 04/23/2023   ALBUMIN 4.8 04/23/2023   CALCIUM 10.2 04/23/2023   ANIONGAP 14 06/11/2022   EGFR 113 04/23/2023   Lab Results  Component Value Date   CHOL 170 07/17/2022   Lab Results  Component Value Date   HDL 37 (L) 07/17/2022   Lab Results  Component Value Date   LDLCALC 110 (H) 07/17/2022   Lab Results  Component Value Date   TRIG 129 07/17/2022   Lab Results  Component Value Date   CHOLHDL 4.6 07/17/2022   Lab Results  Component Value Date    HGBA1C 6.0 (A) 07/19/2023      Assessment & Plan:   Problem List Items Addressed This Visit       Cardiovascular and Mediastinum   Essential hypertension   Blood pressure is fairly controlled on amlodipine 10 mg daily, hydrochlorothiazide 25 mg daily Will add valsartan at next visit if blood pressure remains greater than 130/80 DASH diet and commitment to daily physical activity for a minimum of 30 minutes discussed and encouraged, as a part of hypertension management. The importance of attaining a healthy weight is also discussed.     07/19/2023    2:24 PM 07/19/2023    1:51 PM 04/23/2023   12:19 PM 04/23/2023   12:07 PM 03/11/2023    2:56 PM 03/11/2023    2:16 PM 02/11/2023    1:58 PM  BP/Weight  Systolic BP 132 136 154 152 144 148   Diastolic BP 82 88 103 103 90 92   Wt. (Lbs)  291.8  290  286.5 284  BMI  48.56 kg/m2  48.26 kg/m2  47.68 kg/m2 47.26 kg/m2           Relevant Medications   amLODipine (NORVASC) 10 MG tablet   hydrochlorothiazide (HYDRODIURIL) 25 MG tablet     Respiratory   Asthma     Endocrine   Controlled type 2 diabetes mellitus with complication (HCC) - Primary   Lab Results  Component Value Date   HGBA1C 6.0 (A) 07/19/2023  Well-controlled on metformin 500 mg twice daily Insurance did  not approve Trulicity Patient counseled on low-carb diet Encouraged to engage in regular moderate to vigorous exercise at least 150 minutes weekly Diabetic foot  exam completed  - Microalbumin / creatinine urine ratio - POCT glycosylated hemoglobin (Hb A1C) - Lipid panel - metFORMIN (GLUCOPHAGE) 500 MG tablet; Take 1 tablet (500 mg total) by mouth 2 (two) times daily with a meal.  Dispense: 180 tablet; Refill: 1       Relevant Medications   metFORMIN (GLUCOPHAGE) 500 MG tablet   Other Relevant Orders   Microalbumin / creatinine urine ratio   POCT glycosylated hemoglobin (Hb A1C) (Completed)   Lipid panel     Other   Morbid obesity with BMI of 50.0-59.9,  adult (HCC)   Wt Readings from Last 3 Encounters:  07/19/23 291 lb 12.8 oz (132.4 kg)  04/23/23 290 lb (131.5 kg)  03/11/23 286 lb 8 oz (130 kg)   Body mass index is 48.56 kg/m.  Patient counseled on low carb diet Encouraged engage in regular moderate to vigorous exercises at least 150 minutes weekly Benefits of healthy weights discussed       Relevant Medications   metFORMIN (GLUCOPHAGE) 500 MG tablet   Need for influenza vaccination   Patient educated on CDC recommendation for the vaccine. Verbal consent was obtained from the patient, vaccine administered by nurse, no sign of adverse reactions noted at this time. Patient education on arm soreness and use of tylenol or ibuprofen (if safe) for this patient  was discussed. Patient educated on the signs and symptoms of adverse effect and advise to contact the office if they occur. Vaccine information sheet given to patient.        Relevant Orders   Flu vaccine trivalent PF, 6mos and older(Flulaval,Afluria,Fluarix,Fluzone) (Completed)   Dyslipidemia, goal LDL below 70   Lab Results  Component Value Date   CHOL 170 07/17/2022   HDL 37 (L) 07/17/2022   LDLCALC 110 (H) 07/17/2022   TRIG 129 07/17/2022   CHOLHDL 4.6 07/17/2022   Ldl goal is less than 70  On atorvastatin 20 mg daily  Checking lipid panel       Relevant Medications   amLODipine (NORVASC) 10 MG tablet   hydrochlorothiazide (HYDRODIURIL) 25 MG tablet    Meds ordered this encounter  Medications   amLODipine (NORVASC) 10 MG tablet    Sig: Take 1 tablet (10 mg total) by mouth daily.    Dispense:  90 tablet    Refill:  1   hydrochlorothiazide (HYDRODIURIL) 25 MG tablet    Sig: Take 1 tablet (25 mg total) by mouth daily.    Dispense:  90 tablet    Refill:  1   metFORMIN (GLUCOPHAGE) 500 MG tablet    Sig: Take 1 tablet (500 mg total) by mouth 2 (two) times daily with a meal.    Dispense:  180 tablet    Refill:  1    Follow-up: Return in about 4 months (around  11/17/2023) for CPE.    Donell Beers, FNP

## 2023-07-19 NOTE — Assessment & Plan Note (Signed)
Blood pressure is fairly controlled on amlodipine 10 mg daily, hydrochlorothiazide 25 mg daily Will add valsartan at next visit if blood pressure remains greater than 130/80 DASH diet and commitment to daily physical activity for a minimum of 30 minutes discussed and encouraged, as a part of hypertension management. The importance of attaining a healthy weight is also discussed.     07/19/2023    2:24 PM 07/19/2023    1:51 PM 04/23/2023   12:19 PM 04/23/2023   12:07 PM 03/11/2023    2:56 PM 03/11/2023    2:16 PM 02/11/2023    1:58 PM  BP/Weight  Systolic BP 132 136 154 152 144 148   Diastolic BP 82 88 103 103 90 92   Wt. (Lbs)  291.8  290  286.5 284  BMI  48.56 kg/m2  48.26 kg/m2  47.68 kg/m2 47.26 kg/m2

## 2023-07-19 NOTE — Assessment & Plan Note (Signed)
Patient educated on CDC recommendation for the vaccine. Verbal consent was obtained from the patient, vaccine administered by nurse, no sign of adverse reactions noted at this time. Patient education on arm soreness and use of tylenol or ibuprofen (if safe) for this patient  was discussed. Patient educated on the signs and symptoms of adverse effect and advise to contact the office if they occur. Vaccine information sheet given to patient.

## 2023-07-19 NOTE — Assessment & Plan Note (Signed)
Wt Readings from Last 3 Encounters:  07/19/23 291 lb 12.8 oz (132.4 kg)  04/23/23 290 lb (131.5 kg)  03/11/23 286 lb 8 oz (130 kg)   Body mass index is 48.56 kg/m.  Patient counseled on low carb diet Encouraged engage in regular moderate to vigorous exercises at least 150 minutes weekly Benefits of healthy weights discussed

## 2023-07-20 ENCOUNTER — Other Ambulatory Visit: Payer: Self-pay | Admitting: Nurse Practitioner

## 2023-07-20 DIAGNOSIS — E785 Hyperlipidemia, unspecified: Secondary | ICD-10-CM

## 2023-07-20 LAB — LIPID PANEL
Chol/HDL Ratio: 3.3 {ratio} (ref 0.0–5.0)
Cholesterol, Total: 111 mg/dL (ref 100–199)
HDL: 34 mg/dL — ABNORMAL LOW (ref >39)
LDL Chol Calc (NIH): 59 mg/dL (ref 0–99)
Triglycerides: 94 mg/dL (ref 0–149)
VLDL Cholesterol Cal: 18 mg/dL (ref 5–40)

## 2023-07-20 LAB — MICROALBUMIN / CREATININE URINE RATIO
Creatinine, Urine: 294 mg/dL
Microalb/Creat Ratio: 8 mg/g{creat} (ref 0–29)
Microalbumin, Urine: 24.3 ug/mL

## 2023-07-20 MED ORDER — ATORVASTATIN CALCIUM 20 MG PO TABS
20.0000 mg | ORAL_TABLET | Freq: Every day | ORAL | 5 refills | Status: DC
  Filled 2023-08-05: qty 30, 30d supply, fill #0
  Filled 2023-09-04: qty 30, 30d supply, fill #1
  Filled 2023-10-04: qty 30, 30d supply, fill #2
  Filled 2023-11-07: qty 30, 30d supply, fill #3
  Filled 2023-12-10: qty 30, 30d supply, fill #4

## 2023-07-30 ENCOUNTER — Ambulatory Visit: Payer: Self-pay | Admitting: Nurse Practitioner

## 2023-07-30 ENCOUNTER — Ambulatory Visit: Payer: Self-pay | Admitting: Family Medicine

## 2023-08-03 ENCOUNTER — Other Ambulatory Visit: Payer: Self-pay

## 2023-08-05 ENCOUNTER — Other Ambulatory Visit: Payer: Self-pay

## 2023-08-06 ENCOUNTER — Other Ambulatory Visit: Payer: Self-pay

## 2023-08-22 ENCOUNTER — Encounter: Payer: Commercial Managed Care - HMO | Attending: Family Medicine | Admitting: Dietician

## 2023-08-22 ENCOUNTER — Encounter: Payer: Self-pay | Admitting: Dietician

## 2023-08-22 DIAGNOSIS — E119 Type 2 diabetes mellitus without complications: Secondary | ICD-10-CM | POA: Diagnosis present

## 2023-08-22 DIAGNOSIS — Z713 Dietary counseling and surveillance: Secondary | ICD-10-CM | POA: Diagnosis not present

## 2023-08-22 NOTE — Progress Notes (Signed)
Diabetes Self-Management Education  Visit Type: Follow-up  Appt. Start Time: 1400 Appt. End Time: 1435  08/22/2023  [name], identified by name and date of birth, is a 48 y.o. male with a diagnosis of Diabetes:  .   ASSESSMENT Visit was conducted on MyChart virtual platform Pt reports getting labs since last visit (07/19/2023: Cholesterol - 111, TGL - 94, LDL - 59, A1c - 6.0%), lipids have improved significantly, slight elevation in A1c (0.1%).  Pt reports well controlled blood pressure. Pt reports continuing metformin daily, looked into financial assistance for Trulicity but was unable to get approval.  Pt reports continuing to walk 2-3 times a week, usually to the store and back.  Pt reports watching sugar intake/limiting sweets, switched from pork bacon to [place] bacon, trying to have more vegetables (carrots, non-fried potatoes) Pt reports starting to help prepare larger meals together with their mother more in the last few months, trying to help out more in the preparation of meals.  Pt reports trying to get up and move every hour while at home. Pt reports continuing to look for a job, wants a desk job or remote work from home.    Diabetes Self-Management Education - 08/22/23 1438       Visit Information   Visit Type Follow-up      Pre-Education Assessment   Patient understands the diabetes disease and treatment process. Needs Review    Patient understands incorporating nutritional management into lifestyle. Needs Review    Patient undertands incorporating physical activity into lifestyle. Needs Review    Patient understands using medications safely. Demonstrates understanding / competency    Patient understands monitoring blood glucose, interpreting and using results N/A (comment)    Patient understands prevention, detection, and treatment of acute complications. N/A (comment)    Patient understands prevention, detection, and treatment of chronic complications. Needs  Review    Patient understands how to develop strategies to address psychosocial issues. Needs Review    Patient understands how to develop strategies to promote health/change behavior. Needs Review      Complications   Last HgB A1C per patient/outside source 6 %   07/19/2023   How often do you check your blood sugar? 0 times/day (not testing)      Dietary Intake   Breakfast Beef stew (potatoes, carrots, onions, garlic)    Dinner Beef stew (potatoes, carrots, onions, garlic), soda    Beverage(s) Water, Soda      Activity / Exercise   Activity / Exercise Type ADL's;Light (walking / raking leaves)   Walking to the store   How many days per week do you exercise? 1    How many minutes per day do you exercise? 30    Total minutes per week of exercise 30      Patient Education   Healthy Eating Meal options for control of blood glucose level and chronic complications.    Being Active Helped patient identify appropriate exercises in relation to his/her diabetes, diabetes complications and other health issue.    Chronic complications Lipid levels, blood glucose control and heart disease    Lifestyle and Health Coping Lifestyle issues that need to be addressed for better diabetes care   Sedentary lifestyle     Individualized Goals (developed by patient)   Nutrition General guidelines for healthy choices and portions discussed    Physical Activity Exercise 1-2 times per week    Medications take my medication as prescribed      Patient Self-Evaluation of  Goals - Patient rates self as meeting previously set goals (% of time)   Nutrition < 25% (hardly ever/never)    Physical Activity < 25% (hardly ever/never)    Medications >75% (most of the time)    Monitoring Not Applicable    Problem Solving and behavior change strategies  < 25% (hardly ever/never)    Reducing Risk (treating acute and chronic complications) Not Applicable    Health Coping 25 - 50% (sometimes)      Post-Education Assessment    Patient understands the diabetes disease and treatment process. Needs Review    Patient understands incorporating nutritional management into lifestyle. Needs Review    Patient undertands incorporating physical activity into lifestyle. Needs Review    Patient understands using medications safely. Demonstrates understanding / competency    Patient understands monitoring blood glucose, interpreting and using results N/A    Patient understands prevention, detection, and treatment of acute complications. N/A    Patient understands prevention, detection, and treatment of chronic complications. Needs Review    Patient understands how to develop strategies to address psychosocial issues. Needs Review    Patient understands how to develop strategies to promote health/change behavior. Needs Review      Outcomes   Future DMSE 3-4 months    Program Status Not Completed      Subsequent Visit   Since your last visit have you continued or begun to take your medications as prescribed? Yes    Since your last visit have you had your blood pressure checked? Yes    Is your most recent blood pressure lower, unchanged, or higher since your last visit? Lower    Since your last visit have you experienced any weight changes? Gain    Weight Gain (lbs) 8    Since your last visit, are you checking your blood glucose at least once a day? N/A             Individualized Plan for Diabetes Self-Management Training:   Learning Objective:  Patient will have a greater understanding of diabetes self-management. Patient education plan is to attend individual and/or group sessions per assessed needs and concerns.   Plan:   Patient Instructions  Work towards drinking 4 bottles of water every single day.  When looking for jobs, consider expanding your search to jobs that may require a little more activity during your work hours.  Look into recipes for coleslaw that sound appealing to you and prepare a large batch  with your mom!  Take walk to [facility] park as the weather begins to warm up!!   Expected Outcomes:     Education material provided: Actuary for [employer]  If problems or questions, patient to contact team via:  Licensed conveyancer  Future DSME appointment: 3-4 months

## 2023-08-22 NOTE — Patient Instructions (Addendum)
Work towards drinking 4 bottles of water every single day.  When looking for jobs, consider expanding your search to jobs that may require a little more activity during your work hours.  Look into recipes for coleslaw that sound appealing to you and prepare a large batch with your mom!  Take walk to [facility] park as the weather begins to warm up!!

## 2023-09-05 ENCOUNTER — Other Ambulatory Visit: Payer: Self-pay

## 2023-09-10 NOTE — Progress Notes (Unsigned)
Follow Up Note  RE: [name] MRN: [id number] DOB: 07-08-1976 Date of Office Visit: 09/11/2023  Referring provider: Massie Maroon, FNP Primary care provider: Donell Beers, FNP  Chief Complaint: No chief complaint on file.  History of Present Illness: I had the pleasure of seeing [name] for a follow up visit at the [facility] on 09/10/2023. He is a 48 y.o. male, who is being followed for asthma, allergic rhinoconjunctivitis, food allergy and rash. His previous allergy office visit was on 03/11/2023 with Dr. Selena Batten. Today is a regular follow up visit.  Discussed the use of AI scribe software for clinical note transcription with the patient, who gave verbal consent to proceed.  History of Present Illness            ***  Assessment and Plan: [name] is a 48 y.o. male with: Mild intermittent asthma Past history - asthma like symptoms for 30+ years. Main triggers are exertion and panic attacks. Uses albuterol once a week with good benefit. 2022 spirometry showed restriction most likely due to body habitus.  Interim history - used albuterol during Covid-19 infection in February otherwise doing well. Today's spirometry showed some restriction. May use Airsupra rescue inhaler 2 puffs every 4 to 6 hours as needed for shortness of breath, chest tightness, coughing, and wheezing. Do not use more than 12 puffs in 24 hours. May use Airsupra rescue inhaler 2 puffs 5 to 15 minutes prior to strenuous physical activities. Rinse mouth after each use.  Coupon given. Monitor frequency of use - if you need to use it more than twice per week on a consistent basis let us know.    Seasonal and perennial allergic rhinoconjunctivitis Allergic rhinitis due to dust mite Allergy to cockroaches Seasonal allergic rhinitis due to pollen Allergic rhinitis due to animal dander Past history - 2022 skin testing showed: Positive to grass, trees, ragweed, weed pollen, dust  mites, cockroach. Borderline to cat and dog.  Continue environmental control measures. Use over the counter antihistamines such as Zyrtec (cetirizine), Claritin (loratadine), Allegra (fexofenadine), or Xyzal (levocetirizine) daily as needed. May take twice a day during allergy flares. May switch antihistamines every few months.   Food allergy Past history - delayed reaction to shrimp as a child in the form of right eye swelling. Resolved without any intervention. Then had another episode of eye swelling as an adult but not sure of shellfish exposure. Tolerates finned fish with no issues. 2022 skin testing showed: Positive to shellfish, shrimp, crab, lobster, oyster.  Continue strict avoidance of shellfish and mollusks I have prescribed epinephrine injectable device. For mild symptoms you can take over the counter antihistamines such as Benadryl 1-2 tablets = 25-50mg  and monitor symptoms closely. If symptoms worsen or if you have severe symptoms including breathing issues, throat closure, significant swelling, whole body hives, severe diarrhea and vomiting, lightheadedness then inject epinephrine and seek immediate medical care afterwards. Emergency action plan in place.    Rash and other nonspecific skin eruption Monitor symptoms.  Continue proper skin care.  Refer to dermatology. Assessment and Plan              No follow-ups on file.  No orders of the defined types were placed in this encounter.  Lab Orders  No laboratory test(s) ordered today    Diagnostics: Spirometry:  Tracings reviewed. His effort: {Blank single:19197::"Good reproducible efforts.","It was hard to get consistent efforts and there is a question as to whether this reflects  a maximal maneuver.","Poor effort, data can not be interpreted."} FVC: ***L FEV1: ***L, ***% predicted FEV1/FVC ratio: ***% Interpretation: {Blank single:19197::"Spirometry consistent with mild obstructive disease","Spirometry consistent with  moderate obstructive disease","Spirometry consistent with severe obstructive disease","Spirometry consistent with possible restrictive disease","Spirometry consistent with mixed obstructive and restrictive disease","Spirometry uninterpretable due to technique","Spirometry consistent with normal pattern","No overt abnormalities noted given today's efforts"}.  Please see scanned spirometry results for details.  Skin Testing: {Blank single:19197::"Select foods","Environmental allergy panel","Environmental allergy panel and select foods","Food allergy panel","None","Deferred due to recent antihistamines use"}. *** Results discussed with patient/family.   Medication List:  Current Outpatient Medications  Medication Sig Dispense Refill   albuterol (VENTOLIN HFA) 108 (90 Base) MCG/ACT inhaler Inhale 1 puff into the lungs every 6 (six) hours as needed for wheezing or shortness of breath. (Patient not taking: Reported on 07/19/2023) 8 g 1   Albuterol-Budesonide (AIRSUPRA) 90-80 MCG/ACT AERO Inhale 2 puffs into the lungs every 4 hours as needed (coughing, wheezing, chest tightness). Do not exceed 12 puffs in 24 hours. 10.7 g 2   amLODipine (NORVASC) 10 MG tablet Take 1 tablet (10 mg total) by mouth daily. 90 tablet 1   atorvastatin (LIPITOR) 20 MG tablet Take 1 tablet (20 mg total) by mouth daily. 60 tablet 5   EPINEPHrine 0.3 mg/0.3 mL IJ SOAJ injection Inject 0.3 mg into the muscle as needed for anaphylaxis. (Patient not taking: Reported on 07/19/2023) 2 each 1   hydrochlorothiazide (HYDRODIURIL) 25 MG tablet Take 1 tablet (25 mg total) by mouth daily. 90 tablet 1   levocetirizine (XYZAL) 5 MG tablet Take 1 tablet (5 mg total) by mouth every evening. (Patient not taking: Reported on 07/19/2023) 30 tablet 5   metFORMIN (GLUCOPHAGE) 500 MG tablet Take 1 tablet (500 mg total) by mouth 2 (two) times daily with a meal. 180 tablet 1   triamcinolone ointment (KENALOG) 0.5 % Apply 1 application  topically 2 (two)  times daily. (Patient not taking: Reported on 07/19/2023) 30 g 0   No current facility-administered medications for this visit.   Allergies: Allergies  Allergen Reactions   Crab Extract    Lisinopril     coughing   Shellfish Allergy    Shrimp Extract    I reviewed his past medical history, social history, family history, and environmental history and no significant changes have been reported from his previous visit.  Review of Systems  Constitutional:  Negative for appetite change, chills, fever and unexpected weight change.  HENT:  Negative for congestion, postnasal drip and rhinorrhea.   Eyes:  Negative for itching.  Respiratory:  Negative for cough, chest tightness, shortness of breath and wheezing.   Cardiovascular:  Negative for chest pain.  Gastrointestinal:  Negative for abdominal pain.  Genitourinary:  Negative for difficulty urinating.  Skin:  Positive for rash.  Allergic/Immunologic: Positive for environmental allergies and food allergies.  Neurological:  Negative for headaches.    Objective: There were no vitals taken for this visit. There is no height or weight on file to calculate BMI. Physical Exam Vitals and nursing note reviewed.  Constitutional:      Appearance: Normal appearance. He is well-developed. He is obese.  HENT:     Head: Normocephalic and atraumatic.     Right Ear: Tympanic membrane and external ear normal.     Left Ear: Tympanic membrane and external ear normal.     Nose: Nose normal.     Mouth/Throat:     Mouth: Mucous membranes are moist.  Pharynx: Oropharynx is clear.  Eyes:     Conjunctiva/sclera: Conjunctivae normal.  Cardiovascular:     Rate and Rhythm: Normal rate and regular rhythm.     Heart sounds: Normal heart sounds. No murmur heard.    No friction rub. No gallop.  Pulmonary:     Effort: Pulmonary effort is normal.     Breath sounds: Normal breath sounds. No wheezing, rhonchi or rales.  Musculoskeletal:     Cervical  back: Neck supple.  Skin:    General: Skin is warm.     Findings: Rash present.     Comments: Erythematous hue on the face - improved.  Neurological:     Mental Status: He is alert and oriented to person, place, and time.  Psychiatric:        Behavior: Behavior normal.    Previous notes and tests were reviewed. The plan was reviewed with the patient/family, and all questions/concerned were addressed.  It was my pleasure to see Edward Carr today and participate in his care. Please feel free to contact me with any questions or concerns.  Sincerely,  Wyline Mood, DO Allergy & Immunology  [facility] office: [phone] [facility] office: [phone]

## 2023-09-11 ENCOUNTER — Encounter: Payer: Self-pay | Admitting: Allergy

## 2023-09-11 ENCOUNTER — Other Ambulatory Visit: Payer: Self-pay

## 2023-09-11 ENCOUNTER — Ambulatory Visit (INDEPENDENT_AMBULATORY_CARE_PROVIDER_SITE_OTHER): Payer: Commercial Managed Care - HMO | Admitting: Allergy

## 2023-09-11 ENCOUNTER — Telehealth: Payer: Self-pay | Admitting: Allergy

## 2023-09-11 VITALS — BP 148/98 | HR 93 | Temp 98.8°F | Resp 12

## 2023-09-11 DIAGNOSIS — H1010 Acute atopic conjunctivitis, unspecified eye: Secondary | ICD-10-CM

## 2023-09-11 DIAGNOSIS — J3089 Other allergic rhinitis: Secondary | ICD-10-CM

## 2023-09-11 DIAGNOSIS — I10 Essential (primary) hypertension: Secondary | ICD-10-CM

## 2023-09-11 DIAGNOSIS — T781XXD Other adverse food reactions, not elsewhere classified, subsequent encounter: Secondary | ICD-10-CM

## 2023-09-11 DIAGNOSIS — J4540 Moderate persistent asthma, uncomplicated: Secondary | ICD-10-CM

## 2023-09-11 DIAGNOSIS — J302 Other seasonal allergic rhinitis: Secondary | ICD-10-CM | POA: Diagnosis not present

## 2023-09-11 DIAGNOSIS — R21 Rash and other nonspecific skin eruption: Secondary | ICD-10-CM | POA: Diagnosis not present

## 2023-09-11 MED ORDER — FLUTICASONE FUROATE-VILANTEROL 100-25 MCG/ACT IN AEPB
1.0000 | INHALATION_SPRAY | Freq: Every day | RESPIRATORY_TRACT | 3 refills | Status: DC
  Filled 2023-09-11: qty 60, 30d supply, fill #0

## 2023-09-11 MED ORDER — NEFFY 2 MG/0.1ML NA SOLN: 1.0000 | NASAL | 1 refills | Status: DC | PRN

## 2023-09-11 NOTE — Telephone Encounter (Signed)
Please place referral to dermatology for facial rash.

## 2023-09-11 NOTE — Patient Instructions (Addendum)
Environmental allergies: 2022 skin testing positive to grass, trees, ragweed, weed pollen, dust mites, cockroach. Borderline to cat and dog.  Continue environmental control measures. Use over the counter antihistamines such as Zyrtec (cetirizine), Claritin (loratadine), Allegra (fexofenadine), or Xyzal (levocetirizine) daily as needed. May take twice a day during allergy flares. May switch antihistamines every few months.  Food allergy: Continue strict avoidance of shellfish and mollusks I have prescribed epinephrine device (Neffy) and demonstrated proper use. For mild symptoms you can take over the counter antihistamines such as Benadryl 1-2 tablets = 25-50mg  and monitor symptoms closely. If symptoms worsen or if you have severe symptoms including breathing issues, throat closure, significant swelling, whole body hives, severe diarrhea and vomiting, lightheadedness then spray Neffy in the nose and seek immediate medical care afterwards. Do not use any nasal sprays for 2 weeks afterwards.  If Neffy is not covered let me know.  They will mail this to you.   Asthma:  Daily controller medication(s): start Breo 1 puff once a day rinse mouth after each use. Demonstrated proper use.  If Virgel Bouquet is not covered: Check the pricing for the following inhalers: Elwin Sleight Advair HFA Advair Diskus Wixela Symbicort Breyna  May use Airsupra rescue inhaler 2 puffs every 4 to 6 hours as needed for shortness of breath, chest tightness, coughing, and wheezing. Do not use more than 12 puffs in 24 hours. May use Airsupra rescue inhaler 2 puffs 5 to 15 minutes prior to strenuous physical activities. Rinse mouth after each use.  Monitor frequency of use - if you need to use it more than twice per week on a consistent basis let us know.  Breathing control goals:  Full participation in all desired activities (may need albuterol before activity) Albuterol use two times or less a week on average (not counting use  with activity) Cough interfering with sleep two times or less a month Oral steroids no more than once a year No hospitalizations  Coughing Avoid lisinopril type of blood pressure medication.  Skin: Monitor symptoms.  Continue proper skin care.  Refer to dermatologist.   Elevated blood pressure  Blood pressure reading was high in our office today. Vitals:   09/11/23 1401 09/11/23 1516  BP: (!) 142/98 (!) 148/98   Please follow up with PCP regarding this.    Return in about 3 months (around 12/09/2023). Or sooner if needed.   Skin care recommendations  Bath time: Always use lukewarm water. AVOID very hot or cold water. Keep bathing time to 5-10 minutes. Do NOT use bubble bath. Use a mild soap and use just enough to wash the dirty areas. Do NOT scrub skin vigorously.  After bathing, pat dry your skin with a towel. Do NOT rub or scrub the skin.  Moisturizers and prescriptions:  ALWAYS apply moisturizers immediately after bathing (within 3 minutes). This helps to lock-in moisture. Use the moisturizer several times a day over the whole body. Good summer moisturizers include: Aveeno, CeraVe, Cetaphil. Good winter moisturizers include: Aquaphor, Vaseline, Cerave, Cetaphil, Eucerin, Vanicream. When using moisturizers along with medications, the moisturizer should be applied about one hour after applying the medication to prevent diluting effect of the medication or moisturize around where you applied the medications. When not using medications, the moisturizer can be continued twice daily as maintenance.  Laundry and clothing: Avoid laundry products with added color or perfumes. Use unscented hypo-allergenic laundry products such as Tide free, Cheer free & gentle, and All free and clear.  If the skin  still seems dry or sensitive, you can try double-rinsing the clothes. Avoid tight or scratchy clothing such as wool. Do not use fabric softeners or dyer sheets.

## 2023-09-11 NOTE — Telephone Encounter (Signed)
Please use this phone number for contact. [phone]

## 2023-09-23 ENCOUNTER — Other Ambulatory Visit: Payer: Self-pay

## 2023-10-08 ENCOUNTER — Other Ambulatory Visit: Payer: Self-pay

## 2023-11-18 ENCOUNTER — Ambulatory Visit: Payer: Self-pay | Admitting: Nurse Practitioner

## 2023-11-18 NOTE — Progress Notes (Signed)
 I connected with  Edward Carr on 11/25/23 by a video enabled telemedicine application and verified that I am speaking with the correct person using two identifiers. Dietitian conducting appointment in office with patient stating their location of home.  I discussed the limitations of evaluation and management by telemedicine. The patient expressed understanding and agreed to proceed.  Diabetes Self-Management Education  Visit Type:  Follow-up  Appt. Start Time: 1350 Appt. End Time: 1422  11/25/2023  [name], identified by name and date of birth, is a 48 y.o. male with a diagnosis of Diabetes:  .     ASSESSMENT Patient is here today alone. Pt reports he is drinking 2 bottles daily and states finances get in the way regarding increases water intake. Pt reports has not had time to get to [facility] park to walk. Pt reports she feels he get plenty of walking stating 30 minutes daily indoors at home. Pt states a plan to increase physical activity to 1 hour daily as tolerated Pt denies trying to prepare a new recipe and is willing to attempt. Pt c/o Poor sleep-RD encouraged to discuss poor sleep with PCP for next steps.  Pt denies recent body weights obtained from home scale. Pt states a desire to schedule an in person visit for next follow up in [place]. All Pt's questions were answered during this encounter.    History includes:  Past Medical History:  Diagnosis Date   Asthma    Diabetes mellitus without complication (HCC)    Essential hypertension 08/22/2016   Hypertension    Morbid obesity with BMI of 50.0-59.9, adult (HCC) 12/13/2020      Medications include:    Current Outpatient Medications:    Albuterol -Budesonide  (AIRSUPRA ) 90-80 MCG/ACT AERO, Inhale 2 puffs into the lungs every 4 hours as needed (coughing, wheezing, chest tightness). Do not exceed 12 puffs in 24 hours., Disp: 10.7 g, Rfl: 2   amLODipine  (NORVASC ) 10 MG tablet, Take 1 tablet (10 mg total) by mouth  daily., Disp: 90 tablet, Rfl: 1   atorvastatin  (LIPITOR) 20 MG tablet, Take 1 tablet (20 mg total) by mouth daily., Disp: 60 tablet, Rfl: 5   EPINEPHrine  (NEFFY) 2 MG/0.1ML SOLN, Place 1 Dose into the nose as needed (anaphylactic reaction). [phone], Disp: 6 each, Rfl: 1   fluticasone  furoate-vilanterol (BREO ELLIPTA ) 100-25 MCG/ACT AEPB, Inhale 1 puff into the lungs daily. Rinse mouth after each use., Disp: 60 each, Rfl: 3   hydrochlorothiazide  (HYDRODIURIL ) 25 MG tablet, Take 1 tablet (25 mg total) by mouth daily., Disp: 90 tablet, Rfl: 1   levocetirizine (XYZAL ) 5 MG tablet, Take 1 tablet (5 mg total) by mouth every evening., Disp: 30 tablet, Rfl: 5   metFORMIN  (GLUCOPHAGE ) 500 MG tablet, Take 1 tablet (500 mg total) by mouth 2 (two) times daily with a meal., Disp: 180 tablet, Rfl: 1   triamcinolone  ointment (KENALOG ) 0.5 %, Apply 1 application  topically 2 (two) times daily., Disp: 30 g, Rfl: 0   Labs:   Lab Results  Component Value Date   HGBA1C 6.0 (A) 07/19/2023   Lab Results  Component Value Date   ALT 67 (H) 04/23/2023   AST 48 (H) 04/23/2023   ALKPHOS 73 04/23/2023   BILITOT 0.3 04/23/2023    24 hour recall: B: skips  L: [employer]cd[employer] 2 double cheesburgers, fries, diet pepsi or skips  D: fried fish, baked potato, diet dr pepper  Beverages: water, diet dr pepper  There were no vitals taken for this  visit. There is no height or weight on file to calculate BMI.    Diabetes Self-Management Education - 11/25/23 1418       Health Coping   How would you rate your overall health? Good      Psychosocial Assessment   Patient Belief/Attitude about Diabetes Motivated to manage diabetes    What is the hardest part about your diabetes right now, causing you the most concern, or is the most worrisome to you about your diabetes?   Making healty food and beverage choices;Checking blood sugar;Getting support / problem solving    Self-care barriers None    Self-management support  Doctor's office    Patient Concerns Nutrition/Meal planning;Weight Control    Special Needs None    Preferred Learning Style No preference indicated    Learning Readiness Change in progress      Pre-Education Assessment   Patient understands the diabetes disease and treatment process. Needs Review    Patient understands incorporating nutritional management into lifestyle. Needs Review    Patient undertands incorporating physical activity into lifestyle. Needs Review    Patient understands using medications safely. Needs Review    Patient understands monitoring blood glucose, interpreting and using results Needs Review    Patient understands prevention, detection, and treatment of acute complications. Needs Review    Patient understands prevention, detection, and treatment of chronic complications. Needs Review    Patient understands how to develop strategies to address psychosocial issues. Needs Review    Patient understands how to develop strategies to promote health/change behavior. Needs Review      Complications   Last HgB A1C per patient/outside source 6 %    How often do you check your blood sugar? 0 times/day (not testing)    Have you had a dilated eye exam in the past 12 months? Yes    Have you had a dental exam in the past 12 months? No    Are you checking your feet? Yes    How many days per week are you checking your feet? 7      Activity / Exercise   Activity / Exercise Type Light (walking / raking leaves)    How many days per week do you exercise? 7    How many minutes per day do you exercise? 30    Total minutes per week of exercise 210      Patient Education   Previous Diabetes Education Yes (please comment)    Disease Pathophysiology Explored patient's options for treatment of their diabetes    Healthy Eating Plate Method    Being Active Helped patient identify appropriate exercises in relation to his/her diabetes, diabetes complications and other health issue.     Medications Reviewed patients medication for diabetes, action, purpose, timing of dose and side effects.    Monitoring Daily foot exams    Chronic complications Dental care;Identified and discussed with patient  current chronic complications;Assessed and discussed foot care and prevention of foot problems    Diabetes Stress and Support Identified and addressed patients feelings and concerns about diabetes    Lifestyle and Health Coping Lifestyle issues that need to be addressed for better diabetes care      Patient Self-Evaluation of Goals - Patient rates self as meeting previously set goals (% of time)   Nutrition 25 - 50% (sometimes)    Physical Activity 50 - 75 % (half of the time)    Monitoring >75% (most of the time)    Problem Solving  and behavior change strategies  < 25% (hardly ever/never)    Reducing Risk (treating acute and chronic complications) 25 - 50% (sometimes)    Health Coping 25 - 50% (sometimes)      Post-Education Assessment   Patient understands the diabetes disease and treatment process. Needs Review    Patient understands incorporating nutritional management into lifestyle. Needs Review    Patient undertands incorporating physical activity into lifestyle. Needs Review    Patient understands using medications safely. Needs Review    Patient understands monitoring blood glucose, interpreting and using results Needs Review    Patient understands prevention, detection, and treatment of acute complications. Needs Review    Patient understands prevention, detection, and treatment of chronic complications. Needs Review    Patient understands how to develop strategies to address psychosocial issues. Needs Review    Patient understands how to develop strategies to promote health/change behavior. Needs Review      Outcomes   Program Status Not Completed      Subsequent Visit   Since your last visit have you continued or begun to take your medications as prescribed? Yes     Since your last visit have you had your blood pressure checked? No    Since your last visit have you experienced any weight changes? --   unknown   Since your last visit, are you checking your blood glucose at least once a day? No             Learning Objective:  Patient will have a greater understanding of diabetes self-management. Patient education plan is to attend individual and/or group sessions per assessed needs and concerns.   Plan:   Patient Instructions  1- Great job with walking! Increase aiming for 1 hour daily as tolerated 2- Increase water; aiming for 4 bottles daily-select water in place soda    Expected Outcomes:  Demonstrated interest in learning but significant barriers to change  Education material provided: My Plate and Snack sheet  If problems or questions, patient to contact team via:  Phone and/or mychart   Future DSME appointment: - 3-4 months

## 2023-11-25 ENCOUNTER — Encounter: Payer: Commercial Managed Care - HMO | Attending: Dietician | Admitting: Dietician

## 2023-11-25 ENCOUNTER — Encounter: Payer: Self-pay | Admitting: Dietician

## 2023-11-25 DIAGNOSIS — E119 Type 2 diabetes mellitus without complications: Secondary | ICD-10-CM | POA: Insufficient documentation

## 2023-11-25 DIAGNOSIS — Z713 Dietary counseling and surveillance: Secondary | ICD-10-CM | POA: Insufficient documentation

## 2023-11-25 DIAGNOSIS — Z6841 Body Mass Index (BMI) 40.0 and over, adult: Secondary | ICD-10-CM | POA: Diagnosis not present

## 2023-11-25 DIAGNOSIS — Z7984 Long term (current) use of oral hypoglycemic drugs: Secondary | ICD-10-CM | POA: Diagnosis not present

## 2023-11-25 NOTE — Patient Instructions (Addendum)
 1- Great job with walking! Increase aiming for 1 hour daily as tolerated 2- Increase water; aiming for 4 bottles daily-select water in place soda

## 2023-12-02 ENCOUNTER — Telehealth: Payer: Self-pay | Admitting: Nurse Practitioner

## 2023-12-02 NOTE — Telephone Encounter (Signed)
 Copied from CRM [id number]. Topic: General - Other >> Dec 02, 2023 10:25 AM Kevelyn M wrote: Reason for CRM: Josh with [facility] called in and requesting for the patient's Insurance ID for the date of service June 18th, [id number]

## 2023-12-03 NOTE — Telephone Encounter (Signed)
 Sent back to admin. KH

## 2023-12-06 ENCOUNTER — Telehealth: Payer: Self-pay | Admitting: Nurse Practitioner

## 2023-12-06 NOTE — Telephone Encounter (Signed)
 I called [facility] in reference to [employer] asking for there fax number to send the information to them. I was told that department do not have a fax, and he did not see any notes about this patient.

## 2023-12-09 ENCOUNTER — Ambulatory Visit: Payer: Commercial Managed Care - HMO | Admitting: Allergy

## 2023-12-11 ENCOUNTER — Encounter: Payer: Self-pay | Admitting: Allergy

## 2023-12-11 ENCOUNTER — Other Ambulatory Visit: Payer: Self-pay

## 2023-12-11 ENCOUNTER — Other Ambulatory Visit (HOSPITAL_COMMUNITY): Payer: Self-pay

## 2023-12-11 ENCOUNTER — Ambulatory Visit (INDEPENDENT_AMBULATORY_CARE_PROVIDER_SITE_OTHER): Admitting: Allergy

## 2023-12-11 VITALS — BP 148/88 | HR 85 | Temp 98.3°F | Resp 16 | Ht 65.0 in | Wt 294.6 lb

## 2023-12-11 DIAGNOSIS — J4540 Moderate persistent asthma, uncomplicated: Secondary | ICD-10-CM

## 2023-12-11 DIAGNOSIS — J3089 Other allergic rhinitis: Secondary | ICD-10-CM

## 2023-12-11 DIAGNOSIS — H1013 Acute atopic conjunctivitis, bilateral: Secondary | ICD-10-CM

## 2023-12-11 DIAGNOSIS — J302 Other seasonal allergic rhinitis: Secondary | ICD-10-CM

## 2023-12-11 DIAGNOSIS — R21 Rash and other nonspecific skin eruption: Secondary | ICD-10-CM

## 2023-12-11 DIAGNOSIS — H1010 Acute atopic conjunctivitis, unspecified eye: Secondary | ICD-10-CM

## 2023-12-11 DIAGNOSIS — T781XXD Other adverse food reactions, not elsewhere classified, subsequent encounter: Secondary | ICD-10-CM

## 2023-12-11 DIAGNOSIS — I10 Essential (primary) hypertension: Secondary | ICD-10-CM

## 2023-12-11 MED ORDER — LEVOCETIRIZINE DIHYDROCHLORIDE 5 MG PO TABS
5.0000 mg | ORAL_TABLET | Freq: Every evening | ORAL | 5 refills | Status: AC
  Filled 2023-12-11: qty 30, 30d supply, fill #0

## 2023-12-11 MED ORDER — MOMETASONE FURO-FORMOTEROL FUM 100-5 MCG/ACT IN AERO
1.0000 | INHALATION_SPRAY | Freq: Two times a day (BID) | RESPIRATORY_TRACT | 5 refills | Status: DC
  Filled 2023-12-11: qty 13, 30d supply, fill #0

## 2023-12-11 MED ORDER — EPINEPHRINE 0.3 MG/0.3ML IJ SOAJ
0.3000 mg | INTRAMUSCULAR | 1 refills | Status: DC | PRN
  Filled 2023-12-11: qty 2, 30d supply, fill #0

## 2023-12-11 NOTE — Patient Instructions (Addendum)
 Environmental allergies: 2022 skin testing positive to grass, trees, ragweed, weed pollen, dust mites, cockroach. Borderline to cat and dog.  Continue environmental control measures. Use over the counter antihistamines such as Zyrtec  (cetirizine ), Claritin (loratadine), Allegra (fexofenadine), or Xyzal  (levocetirizine) daily as needed. May take twice a day during allergy  flares. May switch antihistamines every few months.  Food allergy : Continue strict avoidance of shellfish and mollusks I have prescribed epinephrine  injectable device and demonstrated proper use. For mild symptoms you can take over the counter antihistamines such as Benadryl 1-2 tablets = 25-50mg  and monitor symptoms closely. If symptoms worsen or if you have severe symptoms including breathing issues, throat closure, significant swelling, whole body hives, severe diarrhea and vomiting, lightheadedness then inject epinephrine  and seek immediate medical care afterwards. Emergency action plan in place.   Asthma:  Daily controller medication(s): Dulera 100mcg 1 puff twice a day and rinse mouth after each use. If it's not covered let us  know.  May use Airsupra  rescue inhaler 2 puffs every 4 to 6 hours as needed for shortness of breath, chest tightness, coughing, and wheezing. Do not use more than 12 puffs in 24 hours. May use Airsupra  rescue inhaler 2 puffs 5 to 15 minutes prior to strenuous physical activities. Rinse mouth after each use.  Monitor frequency of use - if you need to use it more than twice per week on a consistent basis let us  know.  Breathing control goals:  Full participation in all desired activities (may need albuterol  before activity) Albuterol  use two times or less a week on average (not counting use with activity) Cough interfering with sleep two times or less a month Oral steroids no more than once a year No hospitalizations  Coughing Avoid lisinopril  type of blood pressure medication.  Skin: Monitor  symptoms.  Continue proper skin care.  Keep dermatology appointment.   Elevated blood pressure  Blood pressure reading was high in our office today. Vitals:   12/11/23 1338  BP: (!) 146/88  Please follow up with PCP regarding this.    Return in about 4 months (around 04/12/2024). Or sooner if needed.   Skin care recommendations  Bath time: Always use lukewarm water. AVOID very hot or cold water. Keep bathing time to 5-10 minutes. Do NOT use bubble bath. Use a mild soap and use just enough to wash the dirty areas. Do NOT scrub skin vigorously.  After bathing, pat dry your skin with a towel. Do NOT rub or scrub the skin.  Moisturizers and prescriptions:  ALWAYS apply moisturizers immediately after bathing (within 3 minutes). This helps to lock-in moisture. Use the moisturizer several times a day over the whole body. Good summer moisturizers include: Aveeno, CeraVe, Cetaphil. Good winter moisturizers include: Aquaphor, Vaseline, Cerave, Cetaphil, Eucerin, Vanicream. When using moisturizers along with medications, the moisturizer should be applied about one hour after applying the medication to prevent diluting effect of the medication or moisturize around where you applied the medications. When not using medications, the moisturizer can be continued twice daily as maintenance.  Laundry and clothing: Avoid laundry products with added color or perfumes. Use unscented hypo-allergenic laundry products such as Tide free, Cheer free & gentle, and All free and clear.  If the skin still seems dry or sensitive, you can try double-rinsing the clothes. Avoid tight or scratchy clothing such as wool. Do not use fabric softeners or dyer sheets.

## 2023-12-11 NOTE — Progress Notes (Signed)
 Follow Up Note  RE: Edward Carr MRN: [id number] DOB: August 26, 1975 Date of Office Visit: 12/11/2023  Referring provider: Paseda, Folashade R, FNP Primary care provider: Paseda, Folashade R, FNP  Chief Complaint: Asthma (No issues ) and Allergic Rhinitis  (No issues)  History of Present Illness: I had the pleasure of seeing Edward Carr for a follow up visit at the [facility] on 12/11/2023. Edward Carr is a 48 y.o. male, who is being followed for asthma, allergic rhinoconjunctivitis, food allergy  and rash . His previous allergy  office visit was on 09/11/2023 with Dr. Burdette Carolin. Today is a regular follow up visit.  Discussed the use of AI scribe software for clinical note transcription with the patient, who gave verbal consent to proceed.    Edward Carr has not been able to pick up his prescribed Breo inhaler due to coverage issues. Edward Carr has Airsupra  and uses it approximately once every two to three weeks, indicating a decrease in frequency. No recent asthma-related symptoms such as wheezing, shortness of breath, or chest tightness, but Edward Carr experiences coughing, which Edward Carr attributes to environmental factors. Edward Carr has not required emergency or urgent care visits for asthma recently.  His blood pressure is managed with daily medication, showing improvement compared to previous readings, though it remains slightly elevated.  No significant allergy  issues this spring and Edward Carr is not taking any allergy  medications. Occasional itchy eyes are present, but Edward Carr has not used any sprays, eye drops, or pills recently. Edward Carr avoids shellfish and mollusks due to allergies and has not picked up a prescribed Neffy due to insurance coverage issues. Edward Carr has never used an EpiPen  before.  Edward Carr has an upcoming appointment with a dermatologist in August for his skin condition, which remains unchanged.  Edward Carr has lost some weight recently. Edward Carr uses the bus for transportation and finds it reliable.   Assessment and Plan: Edward Carr is a 48  y.o. male with: Moderate persistent asthma without complication Past history - asthma like symptoms for 30+ years. Main triggers are exertion and panic attacks. Uses albuterol  once a week with good benefit. 2025 spirometry showed mixed obstructive and restrictive disease with 78% improvement in FEV1 post bronchodilator treatment. Clinically feeling improved.  Interim history - unable to pick up Breo. Using Airsupra  once a month or so.  Today's spirometry showed some restriction. Daily controller medication(s): Dulera 100mcg 1 puff twice a day and rinse mouth after each use. If it's not covered let us  know.  May use Airsupra  rescue inhaler 2 puffs every 4 to 6 hours as needed for shortness of breath, chest tightness, coughing, and wheezing. Do not use more than 12 puffs in 24 hours. May use Airsupra  rescue inhaler 2 puffs 5 to 15 minutes prior to strenuous physical activities. Rinse mouth after each use.  Monitor frequency of use - if you need to use it more than twice per week on a consistent basis let us  know.  Get spirometry at next visit.   Seasonal and perennial allergic rhinoconjunctivitis Past history - 2022 skin testing positive to grass, trees, ragweed, weed pollen, dust mites, cockroach. Borderline to cat and dog.  Interim history - stable without any meds.  Continue environmental control measures. Use over the counter antihistamines such as Zyrtec  (cetirizine ), Claritin (loratadine), Allegra (fexofenadine), or Xyzal  (levocetirizine) daily as needed. May take twice a day during allergy  flares. May switch antihistamines every few months.   Other adverse food reactions, not elsewhere classified, subsequent encounter Past history - delayed reaction to shrimp  as a child in the form of right eye swelling. Resolved without any intervention. Then had another episode of eye swelling as an adult but not sure of shellfish exposure. Tolerates finned fish with no issues. 2022 skin testing positive to  shellfish, shrimp, crab, lobster, oyster.  Interim history - unable to get neffy. Continue strict avoidance of shellfish and mollusks I have prescribed epinephrine  injectable device and demonstrated proper use. For mild symptoms you can take over the counter antihistamines such as Benadryl 1-2 tablets = 25-50mg  and monitor symptoms closely. If symptoms worsen or if you have severe symptoms including breathing issues, throat closure, significant swelling, whole body hives, severe diarrhea and vomiting, lightheadedness then inject epinephrine  and seek immediate medical care afterwards. Emergency action plan in place.   Facial rash Unchanged. Didn't see derm yet. Monitor symptoms.  Continue proper skin care.  Keep dermatology appointment.    Essential hypertension Please follow up with PCP regarding this.    Return in about 4 months (around 04/12/2024).  Meds ordered this encounter  Medications   levocetirizine (XYZAL ) 5 MG tablet    Sig: Take 1 tablet (5 mg total) by mouth every evening.    Dispense:  30 tablet    Refill:  5   mometasone-formoterol (DULERA) 100-5 MCG/ACT AERO    Sig: Inhale 1-2 puffs into the lungs in the morning and at bedtime. Rinse mouth after each use.    Dispense:  13 g    Refill:  5   EPINEPHrine  0.3 mg/0.3 mL IJ SOAJ injection    Sig: Inject 0.3 mg into the muscle as needed for anaphylaxis.    Dispense:  2 each    Refill:  1    May dispense generic/Mylan/Teva brand.   Lab Orders  No laboratory test(s) ordered today    Diagnostics: Spirometry:  Tracings reviewed. His effort: Good reproducible efforts. FVC: 2.28L FEV1: 1.66L, 56% predicted FEV1/FVC ratio: 73% Interpretation: Spirometry consistent with possible restrictive disease.  Please see scanned spirometry results for details.  Results discussed with patient/family.   Medication List:  Current Outpatient Medications  Medication Sig Dispense Refill   Albuterol -Budesonide  (AIRSUPRA ) 90-80  MCG/ACT AERO Inhale 2 puffs into the lungs every 4 hours as needed (coughing, wheezing, chest tightness). Do not exceed 12 puffs in 24 hours. 10.7 g 2   amLODipine  (NORVASC ) 10 MG tablet Take 1 tablet (10 mg total) by mouth daily. 90 tablet 1   atorvastatin  (LIPITOR) 20 MG tablet Take 1 tablet (20 mg total) by mouth daily. 60 tablet 5   EPINEPHrine  0.3 mg/0.3 mL IJ SOAJ injection Inject 0.3 mg into the muscle as needed for anaphylaxis. 2 each 1   hydrochlorothiazide  (HYDRODIURIL ) 25 MG tablet Take 1 tablet (25 mg total) by mouth daily. 90 tablet 1   metFORMIN  (GLUCOPHAGE ) 500 MG tablet Take 1 tablet (500 mg total) by mouth 2 (two) times daily with a meal. 180 tablet 1   mometasone-formoterol (DULERA) 100-5 MCG/ACT AERO Inhale 1-2 puffs into the lungs in the morning and at bedtime. Rinse mouth after each use. 13 g 5   triamcinolone  ointment (KENALOG ) 0.5 % Apply 1 application  topically 2 (two) times daily. 30 g 0   levocetirizine (XYZAL ) 5 MG tablet Take 1 tablet (5 mg total) by mouth every evening. 30 tablet 5   No current facility-administered medications for this visit.   Allergies: Allergies  Allergen Reactions   Crab Extract    Lisinopril      coughing   Shellfish Allergy   Shrimp Extract    I reviewed his past medical history, social history, family history, and environmental history and no significant changes have been reported from his previous visit.  Review of Systems  Constitutional:  Negative for appetite change, chills, fever and unexpected weight change.  HENT:  Negative for congestion, postnasal drip and rhinorrhea.   Eyes:  Negative for itching.  Respiratory:  Negative for cough, chest tightness, shortness of breath and wheezing.   Cardiovascular:  Negative for chest pain.  Gastrointestinal:  Negative for abdominal pain.  Genitourinary:  Negative for difficulty urinating.  Skin:  Positive for rash.  Allergic/Immunologic: Positive for environmental allergies and food  allergies.  Neurological:  Negative for headaches.    Objective: BP (!) 148/88 (BP Location: Right Arm, Patient Position: Sitting, Cuff Size: Large)   Pulse 85   Temp 98.3 F (36.8 C) (Temporal)   Resp 16   Ht 5\' 5"  (1.651 m)   Wt 294 lb 9.6 oz (133.6 kg)   SpO2 96%   BMI 49.02 kg/m  Body mass index is 49.02 kg/m. Physical Exam Vitals and nursing note reviewed.  Constitutional:      Appearance: Normal appearance. Edward Carr is well-developed. Edward Carr is obese.  HENT:     Head: Normocephalic and atraumatic.     Right Ear: Tympanic membrane and external ear normal.     Left Ear: Tympanic membrane and external ear normal.     Nose: Nose normal.     Mouth/Throat:     Mouth: Mucous membranes are moist.     Pharynx: Oropharynx is clear.  Eyes:     Conjunctiva/sclera: Conjunctivae normal.  Cardiovascular:     Rate and Rhythm: Normal rate and regular rhythm.     Heart sounds: Normal heart sounds. No murmur heard.    No friction rub. No gallop.  Pulmonary:     Effort: Pulmonary effort is normal.     Breath sounds: Normal breath sounds. No wheezing, rhonchi or rales.  Musculoskeletal:     Cervical back: Neck supple.  Skin:    General: Skin is warm.     Findings: Rash present.     Comments: Erythematous hue on the face - unchanged  Neurological:     Mental Status: Edward Carr is alert and oriented to person, place, and time.  Psychiatric:        Behavior: Behavior normal.    Previous notes and tests were reviewed. The plan was reviewed with the patient/family, and all questions/concerned were addressed.  It was my pleasure to see Edward Carr today and participate in his care. Please feel free to contact me with any questions or concerns.  Sincerely,  Eudelia Hero, DO Allergy  & Immunology  [facility]  [place] office: [phone] [facility] office: [phone]

## 2023-12-17 ENCOUNTER — Encounter: Payer: Self-pay | Admitting: Nurse Practitioner

## 2023-12-17 ENCOUNTER — Other Ambulatory Visit: Payer: Self-pay

## 2023-12-17 ENCOUNTER — Ambulatory Visit (INDEPENDENT_AMBULATORY_CARE_PROVIDER_SITE_OTHER): Admitting: Nurse Practitioner

## 2023-12-17 ENCOUNTER — Telehealth: Payer: Self-pay

## 2023-12-17 VITALS — BP 136/94 | HR 74 | Temp 97.7°F | Wt 293.0 lb

## 2023-12-17 DIAGNOSIS — I10 Essential (primary) hypertension: Secondary | ICD-10-CM

## 2023-12-17 DIAGNOSIS — E118 Type 2 diabetes mellitus with unspecified complications: Secondary | ICD-10-CM | POA: Diagnosis not present

## 2023-12-17 DIAGNOSIS — Z13228 Encounter for screening for other metabolic disorders: Secondary | ICD-10-CM

## 2023-12-17 DIAGNOSIS — Z1329 Encounter for screening for other suspected endocrine disorder: Secondary | ICD-10-CM

## 2023-12-17 DIAGNOSIS — E785 Hyperlipidemia, unspecified: Secondary | ICD-10-CM

## 2023-12-17 DIAGNOSIS — Z0000 Encounter for general adult medical examination without abnormal findings: Secondary | ICD-10-CM | POA: Diagnosis not present

## 2023-12-17 DIAGNOSIS — Z130 Encounter for screening for diseases of the blood and blood-forming organs and certain disorders involving the immune mechanism: Secondary | ICD-10-CM

## 2023-12-17 DIAGNOSIS — Z1211 Encounter for screening for malignant neoplasm of colon: Secondary | ICD-10-CM | POA: Diagnosis not present

## 2023-12-17 DIAGNOSIS — Z6841 Body Mass Index (BMI) 40.0 and over, adult: Secondary | ICD-10-CM

## 2023-12-17 DIAGNOSIS — Z1321 Encounter for screening for nutritional disorder: Secondary | ICD-10-CM

## 2023-12-17 LAB — POCT GLYCOSYLATED HEMOGLOBIN (HGB A1C): Hemoglobin A1C: 6 % — AB (ref 4.0–5.6)

## 2023-12-17 MED ORDER — TIRZEPATIDE 2.5 MG/0.5ML ~~LOC~~ SOAJ
2.5000 mg | SUBCUTANEOUS | 0 refills | Status: DC
Start: 2023-12-17 — End: 2024-02-12
  Filled 2023-12-17: qty 2, 28d supply, fill #0

## 2023-12-17 MED ORDER — AMLODIPINE BESYLATE 10 MG PO TABS
10.0000 mg | ORAL_TABLET | Freq: Every day | ORAL | 1 refills | Status: DC
  Filled 2023-12-17: qty 90, 90d supply, fill #0
  Filled 2024-01-09: qty 30, 30d supply, fill #0
  Filled 2024-02-12: qty 30, 30d supply, fill #1

## 2023-12-17 MED ORDER — VALSARTAN 80 MG PO TABS
80.0000 mg | ORAL_TABLET | Freq: Every day | ORAL | 1 refills | Status: DC
Start: 2023-12-17 — End: 2024-02-12
  Filled 2023-12-17: qty 30, 30d supply, fill #0

## 2023-12-17 MED ORDER — HYDROCHLOROTHIAZIDE 25 MG PO TABS
25.0000 mg | ORAL_TABLET | Freq: Every day | ORAL | 1 refills | Status: DC
  Filled 2023-12-17: qty 90, 90d supply, fill #0
  Filled 2024-01-09: qty 30, 30d supply, fill #0
  Filled 2024-02-12: qty 30, 30d supply, fill #1

## 2023-12-17 NOTE — Assessment & Plan Note (Addendum)
 Chronic uncontrolled condition currently on amlodipine  10 mg daily, hydrochlorothiazide  25 mg daily Start valsartan 80 mg daily, continue amlodipine  10 mg daily, hydrochlorothiazide  25 mg daily Encouraged to monitor blood pressure at home blood pressure goal is less than 130/80 CMP today, BMP in 2 weeks Follow-up in 4 weeks DASH diet and commitment to daily physical activity for a minimum of 30 minutes discussed and encouraged, as a part of hypertension management. The importance of attaining a healthy weight is also discussed.     12/17/2023    2:50 PM 12/17/2023    2:48 PM 12/11/2023    2:09 PM 12/11/2023    1:38 PM 09/11/2023    3:16 PM 09/11/2023    2:01 PM 07/19/2023    2:24 PM  BP/Weight  Systolic BP 136 139 148 146 148 142 132  Diastolic BP 94 90 88 88 98 98 82  Wt. (Lbs)  293  294.6     BMI  48.76 kg/m2  49.02 kg/m2

## 2023-12-17 NOTE — Assessment & Plan Note (Signed)
Patient referred for colonoscopy

## 2023-12-17 NOTE — Progress Notes (Signed)
 Complete physical exam  Patient: Edward Carr   DOB: August 26, 1975   47 y.o. Male  MRN: [id number]  Subjective:     Chief Complaint  Patient presents with   Annual Exam    Fasting     Edward Carr is a 48 y.o. male  has a past medical history of Asthma, Diabetes mellitus without complication (HCC), Essential hypertension (08/22/2016), Hypertension, and Morbid obesity with BMI of 50.0-59.9, adult (HCC) (12/13/2020). who presents today for a complete physical exam. He reports consuming a low carb diet  diet. The patient does not participate in regular exercise at present. He generally feels well. He reports sleeping He does not have additional problems to discuss today.     Most recent fall risk assessment:    10/16/2022   11:08 AM  Fall Risk   Falls in the past year? 0     Most recent depression screenings:    12/17/2023    2:47 PM 07/19/2023    1:50 PM  PHQ 2/9 Scores  PHQ - 2 Score 0 0        Patient Care Team: Zamier Eggebrecht R, FNP as PCP - General (Nurse Practitioner) Wendie Hamburg, MD as PCP - Cardiology (Cardiology)   Outpatient Medications Prior to Visit  Medication Sig   atorvastatin  (LIPITOR) 20 MG tablet Take 1 tablet (20 mg total) by mouth daily.   metFORMIN  (GLUCOPHAGE ) 500 MG tablet Take 1 tablet (500 mg total) by mouth 2 (two) times daily with a meal.   [DISCONTINUED] amLODipine  (NORVASC ) 10 MG tablet Take 1 tablet (10 mg total) by mouth daily.   [DISCONTINUED] hydrochlorothiazide  (HYDRODIURIL ) 25 MG tablet Take 1 tablet (25 mg total) by mouth daily.   Albuterol -Budesonide  (AIRSUPRA ) 90-80 MCG/ACT AERO Inhale 2 puffs into the lungs every 4 hours as needed (coughing, wheezing, chest tightness). Do not exceed 12 puffs in 24 hours. (Patient not taking: Reported on 12/17/2023)   EPINEPHrine  0.3 mg/0.3 mL IJ SOAJ injection Inject 0.3 mg into the muscle as needed for anaphylaxis. (Patient not taking: Reported on 12/17/2023)   levocetirizine (XYZAL ) 5  MG tablet Take 1 tablet (5 mg total) by mouth every evening. (Patient not taking: Reported on 12/17/2023)   mometasone -formoterol  (DULERA ) 100-5 MCG/ACT AERO Inhale 1-2 puffs into the lungs in the morning and at bedtime. Rinse mouth after each use. (Patient not taking: Reported on 12/17/2023)   triamcinolone  ointment (KENALOG ) 0.5 % Apply 1 application  topically 2 (two) times daily. (Patient not taking: Reported on 12/17/2023)   No facility-administered medications prior to visit.    Review of Systems  Constitutional:  Negative for appetite change, chills, fatigue and fever.  HENT:  Negative for congestion, postnasal drip, rhinorrhea and sneezing.   Eyes:  Negative for pain, discharge and itching.  Respiratory:  Negative for cough, shortness of breath and wheezing.   Cardiovascular:  Negative for chest pain, palpitations and leg swelling.  Gastrointestinal:  Negative for abdominal pain, constipation, nausea and vomiting.  Endocrine: Negative for cold intolerance, heat intolerance and polydipsia.  Genitourinary:  Negative for difficulty urinating, dysuria, flank pain and frequency.  Musculoskeletal:  Negative for arthralgias, back pain, joint swelling and myalgias.  Skin:  Negative for color change, pallor, rash and wound.  Allergic/Immunologic: Positive for food allergies. Negative for immunocompromised state.  Neurological:  Negative for dizziness, facial asymmetry, weakness, numbness and headaches.  Psychiatric/Behavioral:  Negative for behavioral problems, confusion, self-injury and suicidal ideas.        Objective:  BP (!) 136/94   Pulse 74   Temp 97.7 F (36.5 C)   Wt 293 lb (132.9 kg)   SpO2 96%   BMI 48.76 kg/m    Physical Exam Vitals and nursing note reviewed.  Constitutional:      General: He is not in acute distress.    Appearance: Normal appearance. He is obese. He is not ill-appearing, toxic-appearing or diaphoretic.  HENT:     Right Ear: External ear normal.      Left Ear: External ear normal.     Nose: Nose normal. No congestion or rhinorrhea.     Mouth/Throat:     Mouth: Mucous membranes are moist.     Pharynx: Oropharynx is clear. No oropharyngeal exudate or posterior oropharyngeal erythema.  Eyes:     General: No scleral icterus.       Right eye: No discharge.        Left eye: No discharge.     Extraocular Movements: Extraocular movements intact.     Conjunctiva/sclera: Conjunctivae normal.  Neck:     Vascular: No carotid bruit.  Cardiovascular:     Rate and Rhythm: Normal rate and regular rhythm.     Pulses: Normal pulses.     Heart sounds: Normal heart sounds. No murmur heard.    No friction rub. No gallop.  Pulmonary:     Effort: Pulmonary effort is normal. No respiratory distress.     Breath sounds: Normal breath sounds. No stridor. No wheezing, rhonchi or rales.  Chest:     Chest wall: No tenderness.  Abdominal:     General: Bowel sounds are normal.     Palpations: Abdomen is soft. There is no mass.     Tenderness: There is no abdominal tenderness.     Hernia: No hernia is present.  Musculoskeletal:        General: No tenderness or signs of injury.     Cervical back: Normal range of motion and neck supple. No rigidity or tenderness.     Right lower leg: No edema.     Left lower leg: No edema.  Lymphadenopathy:     Cervical: No cervical adenopathy.  Skin:    General: Skin is warm and dry.     Capillary Refill: Capillary refill takes less than 2 seconds.     Coloration: Skin is not jaundiced or pale.     Findings: No bruising, erythema, lesion or rash.  Neurological:     Mental Status: He is alert and oriented to person, place, and time.     Cranial Nerves: No cranial nerve deficit.     Sensory: No sensory deficit.     Motor: No weakness.     Gait: Gait normal.  Psychiatric:        Mood and Affect: Mood normal.        Behavior: Behavior normal.        Thought Content: Thought content normal.        Judgment:  Judgment normal.     Results for orders placed or performed in visit on 12/17/23  POCT glycosylated hemoglobin (Hb A1C)  Result Value Ref Range   Hemoglobin A1C 6.0 (A) 4.0 - 5.6 %   HbA1c POC (<> result, manual entry)     HbA1c, POC (prediabetic range)     HbA1c, POC (controlled diabetic range)         Assessment & Plan:    Routine Health Maintenance and Physical Exam  Immunization History  Administered  Date(s) Administered   Influenza, Seasonal, Injecte, Preservative Fre 07/19/2023   Influenza,inj,Quad PF,6+ Mos 08/22/2016, 05/22/2017, 08/22/2018, 04/15/2019, 10/03/2021, 07/17/2022   PFIZER(Purple Top)SARS-COV-2 Vaccination 10/26/2019, 11/04/2019, 11/14/2019   Pneumococcal Conjugate-13 06/07/2020   Pneumococcal Polysaccharide-23 09/13/2020   Tdap 08/22/2016    Health Maintenance  Topic Date Due   COVID-19 Vaccine (4 - 2024-25 season) 03/31/2023   Colonoscopy  Never done   OPHTHALMOLOGY EXAM  03/26/2024   Diabetic kidney evaluation - eGFR measurement  04/22/2024   HEMOGLOBIN A1C  06/18/2024   Diabetic kidney evaluation - Urine ACR  07/18/2024   FOOT EXAM  07/18/2024   DTaP/Tdap/Td (2 - Td or Tdap) 08/22/2026   Pneumococcal Vaccine 65-67 Years old (3 of 3 - PCV20 or PCV21) 07/29/2028   Hepatitis C Screening  Completed   HIV Screening  Completed   HPV VACCINES  Aged Out   Meningococcal B Vaccine  Aged Out    Discussed health benefits of physical activity, and encouraged him to engage in regular exercise appropriate for his age and condition.  Problem List Items Addressed This Visit       Cardiovascular and Mediastinum   Essential hypertension   Chronic uncontrolled condition currently on amlodipine  10 mg daily, hydrochlorothiazide  25 mg daily Start valsartan 80 mg daily, continue amlodipine  10 mg daily, hydrochlorothiazide  25 mg daily Encouraged to monitor blood pressure at home blood pressure goal is less than 130/80 CMP today, BMP in 2 weeks Follow-up in 4  weeks DASH diet and commitment to daily physical activity for a minimum of 30 minutes discussed and encouraged, as a part of hypertension management. The importance of attaining a healthy weight is also discussed.     12/17/2023    2:50 PM 12/17/2023    2:48 PM 12/11/2023    2:09 PM 12/11/2023    1:38 PM 09/11/2023    3:16 PM 09/11/2023    2:01 PM 07/19/2023    2:24 PM  BP/Weight  Systolic BP 136 139 148 146 148 142 132  Diastolic BP 94 90 88 88 98 98 82  Wt. (Lbs)  293  294.6     BMI  48.76 kg/m2  49.02 kg/m2              Relevant Medications   valsartan (DIOVAN) 80 MG tablet   amLODipine  (NORVASC ) 10 MG tablet   hydrochlorothiazide  (HYDRODIURIL ) 25 MG tablet   Other Relevant Orders   CMP14+EGFR   Basic Metabolic Panel     Endocrine   Controlled type 2 diabetes mellitus with complication (HCC)   Lab Results  Component Value Date   HGBA1C 6.0 (A) 12/17/2023  Currently well-controlled on metformin  500 mg twice daily His insurance did not approve Trulicity  in the past we will see if they will  cover Mounjaro Mounjaro 2.5 mg once weekly injection ordered.  Stop metformin  if Mounjaro is approved Patient encouraged to eat smaller portions of meal avoid fatty fried foods to prevent nausea.  Encouraged to report nausea, vomiting abdominal pain Up-to-date with diabetic eye exam, diabetic foot exam      Relevant Medications   valsartan (DIOVAN) 80 MG tablet   tirzepatide (MOUNJARO) 2.5 MG/0.5ML Pen   Other Relevant Orders   POCT glycosylated hemoglobin (Hb A1C) (Completed)   CBC     Other   Morbid obesity with BMI of 50.0-59.9, adult (HCC)   Wt Readings from Last 3 Encounters:  12/17/23 293 lb (132.9 kg)  12/11/23 294 lb 9.6 oz (133.6 kg)  07/19/23 291  lb 12.8 oz (132.4 kg)   Body mass index is 48.76 kg/m.  Patient counseled on low-carb diet Encouraged to engage in regular moderate to vigorous exercises at least 150 minutes weekly as tolerated Benefits of healthy weights  discussed      Relevant Medications   tirzepatide (MOUNJARO) 2.5 MG/0.5ML Pen   Dyslipidemia, goal LDL below 70   Lab Results  Component Value Date   CHOL 111 07/19/2023   HDL 34 (L) 07/19/2023   LDLCALC 59 07/19/2023   TRIG 94 07/19/2023   CHOLHDL 3.3 07/19/2023  Controlled on atorvastatin  20 mg daily Continue current medication Avoid fatty fried foods      Relevant Medications   valsartan (DIOVAN) 80 MG tablet   amLODipine  (NORVASC ) 10 MG tablet   hydrochlorothiazide  (HYDRODIURIL ) 25 MG tablet   Other Relevant Orders   Lipid panel   Annual physical exam - Primary   Annual exam as documented.  Counseling done include healthy lifestyle involving committing to 150 minutes of exercise per week, heart healthy diet, and attaining healthy weight. Changes in health habits are decided on by patient with goals and time frames set for achieving them. Immunization and cancer screening  needs are specifically addressed at this visit.    Patient referred for colonoscopy      Screening for colon cancer   Patient referred for colonoscopy      Relevant Orders   Ambulatory referral to Gastroenterology   Other Visit Diagnoses       Screening for endocrine, nutritional, metabolic and immunity disorder       Relevant Orders   CBC      Return in about 4 weeks (around 01/14/2024) for HTN, DM.     Connee Ikner R Rayel Santizo, FNP

## 2023-12-17 NOTE — Patient Instructions (Addendum)
 Goal for fasting blood sugar ranges from 80 to 120 and 2 hours after any meal or at bedtime should be between 130 to 170.   Around 3 times per week, check your blood pressure 2 times per day. once in the morning and once in the evening. The readings should be at least one minute apart. Write down these values and bring them to your next nurse visit/appointment.  When you check your BP, make sure you have been doing something calm/relaxing 5 minutes prior to checking. Both feet should be flat on the floor and you should be sitting. Use your left arm and make sure it is in a relaxed position (on a table), and that the cuff is at the approximate level/height of your heart. BLOOD PRESSURE GOAL IS LESS THAN 130/80.       1. Controlled type 2 diabetes mellitus with complication, without long-term current use of insulin (HCC) (Primary)  - POCT glycosylated hemoglobin (Hb A1C) - tirzepatide (MOUNJARO) 2.5 MG/0.5ML Pen; Inject 2.5 mg into the skin once a week.  Dispense: 2 mL; Refill: 0  2. Essential hypertension  - valsartan (DIOVAN) 80 MG tablet; Take 1 tablet (80 mg total) by mouth daily.  Dispense: 60 tablet; Refill: 1 - amLODipine  (NORVASC ) 10 MG tablet; Take 1 tablet (10 mg total) by mouth daily.  Dispense: 90 tablet; Refill: 1 - hydrochlorothiazide  (HYDRODIURIL ) 25 MG tablet; Take 1 tablet (25 mg total) by mouth daily.  Dispense: 90 tablet; Refill: 1 - CMP14+EGFR  3. Screening for colon cancer  - Ambulatory referral to Gastroenterology  4. Morbid obesity with BMI of 50.0-59.9, adult (HCC)  - tirzepatide (MOUNJARO) 2.5 MG/0.5ML Pen; Inject 2.5 mg into the skin once a week.  Dispense: 2 mL; Refill: 0  5. Dyslipidemia, goal LDL below 70  - Lipid panel    It is important that you exercise regularly at least 30 minutes 5 times a week as tolerated  Think about what you will eat, plan ahead. Choose " clean, green, fresh or frozen" over canned, processed or packaged foods which are more  sugary, salty and fatty. 70 to 75% of food eaten should be vegetables and fruit. Three meals at set times with snacks allowed between meals, but they must be fruit or vegetables. Aim to eat over a 12 hour period , example 7 am to 7 pm, and STOP after  your last meal of the day. Drink water,generally about 64 ounces per day, no other drink is as healthy. Fruit juice is best enjoyed in a healthy way, by EATING the fruit.  Thanks for choosing [facility] we consider it a privelige to serve you.

## 2023-12-17 NOTE — Telephone Encounter (Signed)
 Pharmacy Patient Advocate Encounter   Received notification from CoverMyMeds that prior authorization for MOUNJARO is required/requested.   Insurance verification completed.   The patient is insured through [employer] .   Per test claim: PA required; PA submitted to above mentioned insurance via CoverMyMeds Key/confirmation #/EOC [id number] Status is pending

## 2023-12-17 NOTE — Assessment & Plan Note (Signed)
 Lab Results  Component Value Date   CHOL 111 07/19/2023   HDL 34 (L) 07/19/2023   LDLCALC 59 07/19/2023   TRIG 94 07/19/2023   CHOLHDL 3.3 07/19/2023  Controlled on atorvastatin  20 mg daily Continue current medication Avoid fatty fried foods

## 2023-12-17 NOTE — Assessment & Plan Note (Signed)
 Wt Readings from Last 3 Encounters:  12/17/23 293 lb (132.9 kg)  12/11/23 294 lb 9.6 oz (133.6 kg)  07/19/23 291 lb 12.8 oz (132.4 kg)   Body mass index is 48.76 kg/m.  Patient counseled on low-carb diet Encouraged to engage in regular moderate to vigorous exercises at least 150 minutes weekly as tolerated Benefits of healthy weights discussed

## 2023-12-17 NOTE — Assessment & Plan Note (Signed)
 Annual exam as documented.  Counseling done include healthy lifestyle involving committing to 150 minutes of exercise per week, heart healthy diet, and attaining healthy weight. Changes in health habits are decided on by patient with goals and time frames set for achieving them. Immunization and cancer screening  needs are specifically addressed at this visit.    Patient referred for colonoscopy

## 2023-12-17 NOTE — Assessment & Plan Note (Addendum)
 Lab Results  Component Value Date   HGBA1C 6.0 (A) 12/17/2023  Currently well-controlled on metformin  500 mg twice daily His insurance did not approve Trulicity  in the past we will see if they will  cover Mounjaro Mounjaro 2.5 mg once weekly injection ordered.  Stop metformin  if Mounjaro is approved Patient encouraged to eat smaller portions of meal avoid fatty fried foods to prevent nausea.  Encouraged to report nausea, vomiting abdominal pain Up-to-date with diabetic eye exam, diabetic foot exam

## 2023-12-18 ENCOUNTER — Other Ambulatory Visit: Payer: Self-pay | Admitting: Nurse Practitioner

## 2023-12-18 ENCOUNTER — Other Ambulatory Visit: Payer: Self-pay

## 2023-12-18 ENCOUNTER — Ambulatory Visit: Payer: Self-pay | Admitting: Nurse Practitioner

## 2023-12-18 DIAGNOSIS — E785 Hyperlipidemia, unspecified: Secondary | ICD-10-CM

## 2023-12-18 DIAGNOSIS — E876 Hypokalemia: Secondary | ICD-10-CM

## 2023-12-18 LAB — CMP14+EGFR
ALT: 19 IU/L (ref 0–44)
AST: 19 IU/L (ref 0–40)
Albumin: 4.4 g/dL (ref 4.1–5.1)
Alkaline Phosphatase: 103 IU/L (ref 44–121)
BUN/Creatinine Ratio: 9 (ref 9–20)
BUN: 10 mg/dL (ref 6–24)
Bilirubin Total: 1 mg/dL (ref 0.0–1.2)
CO2: 22 mmol/L (ref 20–29)
Calcium: 9.6 mg/dL (ref 8.7–10.2)
Chloride: 96 mmol/L (ref 96–106)
Creatinine, Ser: 1.15 mg/dL (ref 0.76–1.27)
Globulin, Total: 3 g/dL (ref 1.5–4.5)
Glucose: 93 mg/dL (ref 70–99)
Potassium: 3.2 mmol/L — ABNORMAL LOW (ref 3.5–5.2)
Sodium: 136 mmol/L (ref 134–144)
Total Protein: 7.4 g/dL (ref 6.0–8.5)
eGFR: 80 mL/min/{1.73_m2} (ref >59)

## 2023-12-18 LAB — LIPID PANEL
Chol/HDL Ratio: 3.6 ratio (ref 0.0–5.0)
Cholesterol, Total: 115 mg/dL (ref 100–199)
HDL: 32 mg/dL — ABNORMAL LOW (ref >39)
LDL Chol Calc (NIH): 63 mg/dL (ref 0–99)
Triglycerides: 106 mg/dL (ref 0–149)
VLDL Cholesterol Cal: 20 mg/dL (ref 5–40)

## 2023-12-18 MED ORDER — POTASSIUM CHLORIDE CRYS ER 20 MEQ PO TBCR
40.0000 meq | EXTENDED_RELEASE_TABLET | Freq: Once | ORAL | 0 refills | Status: DC
Start: 2023-12-18 — End: 2024-03-20
  Filled 2023-12-18: qty 2, 1d supply, fill #0

## 2023-12-18 MED ORDER — ATORVASTATIN CALCIUM 20 MG PO TABS
20.0000 mg | ORAL_TABLET | Freq: Every day | ORAL | 3 refills | Status: DC
  Filled 2023-12-18: qty 90, 90d supply, fill #0
  Filled 2024-01-09: qty 30, 30d supply, fill #0
  Filled 2024-02-12: qty 30, 30d supply, fill #1

## 2023-12-19 ENCOUNTER — Other Ambulatory Visit: Payer: Self-pay

## 2023-12-19 ENCOUNTER — Telehealth: Payer: Self-pay

## 2023-12-19 NOTE — Telephone Encounter (Signed)
 Pharmacy Patient Advocate Encounter  Received notification from [employer] that Prior Authorization for MOUNJARO has been APPROVED from 12/17/2023 to 12/18/2024   PA #/Case ID/Reference #: [id number]

## 2023-12-20 ENCOUNTER — Other Ambulatory Visit: Payer: Self-pay

## 2023-12-30 ENCOUNTER — Other Ambulatory Visit: Payer: Self-pay

## 2023-12-31 ENCOUNTER — Other Ambulatory Visit: Payer: Self-pay

## 2023-12-31 DIAGNOSIS — Z130 Encounter for screening for diseases of the blood and blood-forming organs and certain disorders involving the immune mechanism: Secondary | ICD-10-CM

## 2023-12-31 DIAGNOSIS — E118 Type 2 diabetes mellitus with unspecified complications: Secondary | ICD-10-CM

## 2023-12-31 DIAGNOSIS — I10 Essential (primary) hypertension: Secondary | ICD-10-CM

## 2024-01-01 LAB — CBC
Hematocrit: 41.3 % (ref 37.5–51.0)
Hemoglobin: 13.8 g/dL (ref 13.0–17.7)
MCH: 27.6 pg (ref 26.6–33.0)
MCHC: 33.4 g/dL (ref 31.5–35.7)
MCV: 83 fL (ref 79–97)
Platelets: 304 10*3/uL (ref 150–450)
RBC: 5 x10E6/uL (ref 4.14–5.80)
RDW: 14.4 % (ref 11.6–15.4)
WBC: 6.6 10*3/uL (ref 3.4–10.8)

## 2024-01-01 LAB — BASIC METABOLIC PANEL WITH GFR
BUN/Creatinine Ratio: 9 (ref 9–20)
BUN: 12 mg/dL (ref 6–24)
CO2: 23 mmol/L (ref 20–29)
Calcium: 10 mg/dL (ref 8.7–10.2)
Chloride: 95 mmol/L — ABNORMAL LOW (ref 96–106)
Creatinine, Ser: 1.39 mg/dL — ABNORMAL HIGH (ref 0.76–1.27)
Glucose: 97 mg/dL (ref 70–99)
Potassium: 3.5 mmol/L (ref 3.5–5.2)
Sodium: 137 mmol/L (ref 134–144)
eGFR: 64 mL/min/{1.73_m2} (ref >59)

## 2024-01-09 ENCOUNTER — Other Ambulatory Visit: Payer: Self-pay

## 2024-01-13 ENCOUNTER — Other Ambulatory Visit: Payer: Self-pay

## 2024-01-24 ENCOUNTER — Encounter: Payer: Self-pay | Admitting: Nurse Practitioner

## 2024-02-04 ENCOUNTER — Other Ambulatory Visit: Payer: Self-pay | Admitting: Nurse Practitioner

## 2024-02-04 DIAGNOSIS — E118 Type 2 diabetes mellitus with unspecified complications: Secondary | ICD-10-CM

## 2024-02-05 ENCOUNTER — Other Ambulatory Visit (HOSPITAL_COMMUNITY): Payer: Self-pay

## 2024-02-05 MED ORDER — METFORMIN HCL 500 MG PO TABS
500.0000 mg | ORAL_TABLET | Freq: Two times a day (BID) | ORAL | 1 refills | Status: DC
Start: 2024-02-05 — End: 2024-03-20
  Filled 2024-02-12: qty 180, 90d supply, fill #0

## 2024-02-12 ENCOUNTER — Other Ambulatory Visit: Payer: Self-pay

## 2024-02-12 ENCOUNTER — Other Ambulatory Visit (HOSPITAL_COMMUNITY): Payer: Self-pay

## 2024-02-12 ENCOUNTER — Ambulatory Visit: Payer: Self-pay | Admitting: Nurse Practitioner

## 2024-02-12 ENCOUNTER — Encounter: Payer: Self-pay | Admitting: Nurse Practitioner

## 2024-02-12 DIAGNOSIS — I10 Essential (primary) hypertension: Secondary | ICD-10-CM

## 2024-02-12 DIAGNOSIS — E118 Type 2 diabetes mellitus with unspecified complications: Secondary | ICD-10-CM

## 2024-02-12 DIAGNOSIS — E785 Hyperlipidemia, unspecified: Secondary | ICD-10-CM

## 2024-02-12 DIAGNOSIS — Z6841 Body Mass Index (BMI) 40.0 and over, adult: Secondary | ICD-10-CM

## 2024-02-12 MED ORDER — VALSARTAN 80 MG PO TABS
80.0000 mg | ORAL_TABLET | Freq: Every day | ORAL | 1 refills | Status: DC
  Filled 2024-02-12: qty 30, 30d supply, fill #0
  Filled 2024-02-12: qty 60, 60d supply, fill #0

## 2024-02-12 MED ORDER — AMLODIPINE BESYLATE 10 MG PO TABS
10.0000 mg | ORAL_TABLET | Freq: Every day | ORAL | 1 refills | Status: DC
  Filled 2024-02-12 – 2024-03-11 (×2): qty 90, 90d supply, fill #0
  Filled 2024-06-04: qty 30, 30d supply, fill #1
  Filled 2024-07-14: qty 30, 30d supply, fill #2

## 2024-02-12 MED ORDER — HYDROCHLOROTHIAZIDE 25 MG PO TABS
25.0000 mg | ORAL_TABLET | Freq: Every day | ORAL | 1 refills | Status: DC
  Filled 2024-02-12 – 2024-03-11 (×2): qty 90, 90d supply, fill #0
  Filled 2024-06-04: qty 30, 30d supply, fill #1
  Filled 2024-07-14: qty 30, 30d supply, fill #2

## 2024-02-12 MED ORDER — TIRZEPATIDE 2.5 MG/0.5ML ~~LOC~~ SOAJ
2.5000 mg | SUBCUTANEOUS | 0 refills | Status: DC
  Filled 2024-02-12 (×2): qty 2, 28d supply, fill #0

## 2024-02-12 MED ORDER — ATORVASTATIN CALCIUM 20 MG PO TABS
20.0000 mg | ORAL_TABLET | Freq: Every day | ORAL | 3 refills | Status: DC
  Filled 2024-02-12 – 2024-03-11 (×2): qty 90, 90d supply, fill #0
  Filled 2024-06-04: qty 30, 30d supply, fill #1
  Filled 2024-07-14: qty 30, 30d supply, fill #2
  Filled 2024-08-11: qty 30, 30d supply, fill #3

## 2024-02-12 NOTE — Progress Notes (Signed)
 New Patient Office Visit  Subjective:  Patient ID: Edward Carr, male    DOB: 05-Feb-1976  Age: 48 y.o. MRN: [id number]  CC:  Chief Complaint  Patient presents with   Diabetes   Hypertension   Medical Management of Chronic Issues    HPI Edward Carr is a 48 y.o. male  has a past medical history of Asthma, Diabetes mellitus without complication (HCC), Essential hypertension (08/22/2016), Hypertension, and Morbid obesity with BMI of 50.0-59.9, adult (HCC) (12/13/2020).  Patient presents for follow-up for his chronic medical conditions  Hypertension.  Currently on amlodipine  10 mg daily, hydrochlorothiazide  25 mg daily, was prescribed valsartan  80 mg daily but he did not get the medication because he could not pay for it.  Has a blood pressure cuff at home but it is too small for his arm, currently denies chest pain shortness of breath edema  Type 2 diabetes.  Currently on metformin  500 mg twice daily, was prescribed Mounjaro  but he has not picked up the medication from the pharmacy.  Thanks atorvastatin  20 mg daily for hyperlipidemia  Obesity.  States that he does a lot of walking at home, diet can be better   He would like to be getting his medications from [facility], his medications were transferred to the pharmacy today.  Encouraged to call GI to schedule an appointment for colonoscopy   Past Medical History:  Diagnosis Date   Asthma    Diabetes mellitus without complication (HCC)    Essential hypertension 08/22/2016   Hypertension    Morbid obesity with BMI of 50.0-59.9, adult (HCC) 12/13/2020    Past Surgical History:  Procedure Laterality Date   DENTAL SURGERY      Family History  Problem Relation Age of Onset   Diabetes Mother    Cancer Father    Cancer Sister    Asthma Sister     Social History   Socioeconomic History   Marital status: Single    Spouse name: Not on file   Number of children: Not on file   Years of education: Not on file    Highest education level: Not on file  Occupational History   Not on file  Tobacco Use   Smoking status: Never   Smokeless tobacco: Never  Vaping Use   Vaping status: Never Used  Substance and Sexual Activity   Alcohol use: No   Drug use: No   Sexual activity: Never  Other Topics Concern   Not on file  Social History Narrative   Not on file   Social Drivers of Health   Financial Resource Strain: Not on file  Food Insecurity: No Food Insecurity (02/12/2024)   Hunger Vital Sign    Worried About Running Out of Food in the Last Year: Never true    Ran Out of Food in the Last Year: Never true  Transportation Needs: No Transportation Needs (02/12/2024)   PRAPARE - Administrator, Civil Service (Medical): No    Lack of Transportation (Non-Medical): No  Physical Activity: Not on file  Stress: Not on file  Social Connections: Not on file  Intimate Partner Violence: Not At Risk (02/12/2024)   Humiliation, Afraid, Rape, and Kick questionnaire    Fear of Current or Ex-Partner: No    Emotionally Abused: No    Physically Abused: No    Sexually Abused: No    ROS Review of Systems  Constitutional:  Negative for appetite change, chills, fatigue and fever.  HENT:  Negative for congestion, postnasal drip, rhinorrhea and sneezing.   Respiratory:  Negative for cough, shortness of breath and wheezing.   Cardiovascular:  Negative for chest pain, palpitations and leg swelling.  Gastrointestinal:  Negative for abdominal pain, constipation, nausea and vomiting.  Genitourinary:  Negative for difficulty urinating, dysuria, flank pain and frequency.  Musculoskeletal:  Negative for arthralgias, back pain, joint swelling and myalgias.  Skin:  Negative for color change, pallor, rash and wound.  Neurological:  Negative for dizziness, facial asymmetry, weakness, numbness and headaches.  Psychiatric/Behavioral:  Negative for behavioral problems, confusion, self-injury and suicidal ideas.      Objective:   Today's Vitals: BP (!) 146/98   Pulse 74   Temp (!) 97.2 F (36.2 C)   Wt 291 lb (132 kg)   SpO2 96%   BMI 48.42 kg/m   Physical Exam Vitals and nursing note reviewed.  Constitutional:      General: He is not in acute distress.    Appearance: Normal appearance. He is obese. He is not ill-appearing, toxic-appearing or diaphoretic.  HENT:     Mouth/Throat:     Pharynx: No oropharyngeal exudate.  Eyes:     General: No scleral icterus.       Right eye: No discharge.        Left eye: No discharge.     Extraocular Movements: Extraocular movements intact.     Conjunctiva/sclera: Conjunctivae normal.  Cardiovascular:     Rate and Rhythm: Normal rate and regular rhythm.     Pulses: Normal pulses.     Heart sounds: Normal heart sounds. No murmur heard.    No friction rub. No gallop.  Pulmonary:     Effort: Pulmonary effort is normal. No respiratory distress.     Breath sounds: Normal breath sounds. No stridor. No wheezing, rhonchi or rales.  Chest:     Chest wall: No tenderness.  Abdominal:     General: There is no distension.     Palpations: Abdomen is soft.     Tenderness: There is no abdominal tenderness. There is no right CVA tenderness, left CVA tenderness or guarding.  Musculoskeletal:        General: No swelling, tenderness, deformity or signs of injury.     Right lower leg: No edema.     Left lower leg: No edema.  Skin:    General: Skin is warm and dry.     Capillary Refill: Capillary refill takes less than 2 seconds.     Coloration: Skin is not jaundiced or pale.     Findings: No bruising, erythema or lesion.  Neurological:     Mental Status: He is alert and oriented to person, place, and time.     Motor: No weakness.     Gait: Gait normal.  Psychiatric:        Mood and Affect: Mood normal.        Behavior: Behavior normal.        Thought Content: Thought content normal.        Judgment: Judgment normal.     Assessment & Plan:   Problem  List Items Addressed This Visit       Cardiovascular and Mediastinum   Essential hypertension   Advised to go to the pharmacy to pick up prescription for valsartan  80 mg daily, Continue amlodipine  10 mg daily, add a close I like 25 mg daily Blood pressure goal is less than 130/80 BMP in 2 weeks DASH diet and commitment to daily  physical activity for a minimum of 30 minutes discussed and encouraged, as a part of hypertension management. The importance of attaining a healthy weight is also discussed.     02/12/2024   10:24 AM 02/12/2024   10:14 AM 12/17/2023    2:50 PM 12/17/2023    2:48 PM 12/11/2023    2:09 PM 12/11/2023    1:38 PM 09/11/2023    3:16 PM  BP/Weight  Systolic BP 146 146 136 139 148 146 148  Diastolic BP 98 98 94 90 88 88 98  Wt. (Lbs)  291  293  294.6   BMI  48.42 kg/m2  48.76 kg/m2  49.02 kg/m2            Relevant Medications   amLODipine  (NORVASC ) 10 MG tablet   atorvastatin  (LIPITOR) 20 MG tablet   hydrochlorothiazide  (HYDRODIURIL ) 25 MG tablet   valsartan  (DIOVAN ) 80 MG tablet   Other Relevant Orders   Basic Metabolic Panel     Endocrine   Controlled type 2 diabetes mellitus with complication (HCC)   Lab Results  Component Value Date   HGBA1C 6.0 (A) 12/17/2023  Advised to go pick up Mounjaro  from the pharmacy Stop taking metformin  once he starts Mounjaro  2.5 mg once weekly injection Patient counseled on low-carb diet, advised to lose weight Follow-up in 6 weeks      Relevant Medications   atorvastatin  (LIPITOR) 20 MG tablet   tirzepatide  (MOUNJARO ) 2.5 MG/0.5ML Pen   valsartan  (DIOVAN ) 80 MG tablet     Other   Morbid obesity with BMI of 50.0-59.9, adult (HCC)   Wt Readings from Last 3 Encounters:  02/12/24 291 lb (132 kg)  12/17/23 293 lb (132.9 kg)  12/11/23 294 lb 9.6 oz (133.6 kg)    Body mass index is 48.42 kg/m.  Patient counseled on low-carb diet Encouraged to engage in regular moderate to vigorous exercise at least 150 minutes  weekly as tolerated Benefits of healthy weight discussed Advised to go to the pharmacy to pick up prescription for Mounjaro       Relevant Medications   tirzepatide  (MOUNJARO ) 2.5 MG/0.5ML Pen   Dyslipidemia, goal LDL below 70   Lab Results  Component Value Date   CHOL 115 12/17/2023   HDL 32 (L) 12/17/2023   LDLCALC 63 12/17/2023   TRIG 106 12/17/2023   CHOLHDL 3.6 12/17/2023  Controlled on atorvastatin  20 mg daily Continue current medication      Relevant Medications   amLODipine  (NORVASC ) 10 MG tablet   atorvastatin  (LIPITOR) 20 MG tablet   hydrochlorothiazide  (HYDRODIURIL ) 25 MG tablet   valsartan  (DIOVAN ) 80 MG tablet    Outpatient Encounter Medications as of 02/12/2024  Medication Sig   Albuterol -Budesonide  (AIRSUPRA ) 90-80 MCG/ACT AERO Inhale 2 puffs into the lungs every 4 hours as needed (coughing, wheezing, chest tightness). Do not exceed 12 puffs in 24 hours.   metFORMIN  (GLUCOPHAGE ) 500 MG tablet Take 1 tablet (500 mg total) by mouth 2 (two) times daily with a meal.   [DISCONTINUED] amLODipine  (NORVASC ) 10 MG tablet Take 1 tablet (10 mg total) by mouth daily.   [DISCONTINUED] atorvastatin  (LIPITOR) 20 MG tablet Take 1 tablet (20 mg total) by mouth daily.   [DISCONTINUED] hydrochlorothiazide  (HYDRODIURIL ) 25 MG tablet Take 1 tablet (25 mg total) by mouth daily.   amLODipine  (NORVASC ) 10 MG tablet Take 1 tablet (10 mg total) by mouth daily.   atorvastatin  (LIPITOR) 20 MG tablet Take 1 tablet (20 mg total) by mouth daily.   EPINEPHrine  0.3  mg/0.3 mL IJ SOAJ injection Inject 0.3 mg into the muscle as needed for anaphylaxis. (Patient not taking: Reported on 02/12/2024)   hydrochlorothiazide  (HYDRODIURIL ) 25 MG tablet Take 1 tablet (25 mg total) by mouth daily.   levocetirizine (XYZAL ) 5 MG tablet Take 1 tablet (5 mg total) by mouth every evening. (Patient not taking: Reported on 02/12/2024)   mometasone -formoterol  (DULERA ) 100-5 MCG/ACT AERO Inhale 1-2 puffs into the lungs in  the morning and at bedtime. Rinse mouth after each use. (Patient not taking: Reported on 02/12/2024)   potassium chloride  SA (KLOR-CON  M) 20 MEQ tablet Take 2 tablets (40 mEq total) by mouth once for 1 dose. (Patient not taking: Reported on 02/12/2024)   tirzepatide  (MOUNJARO ) 2.5 MG/0.5ML Pen Inject 2.5 mg into the skin once a week.   triamcinolone  ointment (KENALOG ) 0.5 % Apply 1 application  topically 2 (two) times daily. (Patient not taking: Reported on 02/12/2024)   valsartan  (DIOVAN ) 80 MG tablet Take 1 tablet (80 mg total) by mouth daily.   [DISCONTINUED] tirzepatide  (MOUNJARO ) 2.5 MG/0.5ML Pen Inject 2.5 mg into the skin once a week. (Patient not taking: Reported on 02/12/2024)   [DISCONTINUED] valsartan  (DIOVAN ) 80 MG tablet Take 1 tablet (80 mg total) by mouth daily. (Patient not taking: Reported on 02/12/2024)   No facility-administered encounter medications on file as of 02/12/2024.    Follow-up: Return in about 4 weeks (around 03/11/2024) for HTN.   Izaiyah Kleinman R Ardith Lewman, FNP

## 2024-02-12 NOTE — Assessment & Plan Note (Signed)
 Advised to go to the pharmacy to pick up prescription for valsartan  80 mg daily, Continue amlodipine  10 mg daily, add a close I like 25 mg daily Blood pressure goal is less than 130/80 BMP in 2 weeks DASH diet and commitment to daily physical activity for a minimum of 30 minutes discussed and encouraged, as a part of hypertension management. The importance of attaining a healthy weight is also discussed.     02/12/2024   10:24 AM 02/12/2024   10:14 AM 12/17/2023    2:50 PM 12/17/2023    2:48 PM 12/11/2023    2:09 PM 12/11/2023    1:38 PM 09/11/2023    3:16 PM  BP/Weight  Systolic BP 146 146 136 139 148 146 148  Diastolic BP 98 98 94 90 88 88 98  Wt. (Lbs)  291  293  294.6   BMI  48.42 kg/m2  48.76 kg/m2  49.02 kg/m2

## 2024-02-12 NOTE — Assessment & Plan Note (Signed)
 Lab Results  Component Value Date   HGBA1C 6.0 (A) 12/17/2023  Advised to go pick up Mounjaro  from the pharmacy Stop taking metformin  once he starts Mounjaro  2.5 mg once weekly injection Patient counseled on low-carb diet, advised to lose weight Follow-up in 6 weeks

## 2024-02-12 NOTE — Assessment & Plan Note (Signed)
 Lab Results  Component Value Date   CHOL 115 12/17/2023   HDL 32 (L) 12/17/2023   LDLCALC 63 12/17/2023   TRIG 106 12/17/2023   CHOLHDL 3.6 12/17/2023  Controlled on atorvastatin  20 mg daily Continue current medication

## 2024-02-12 NOTE — Assessment & Plan Note (Signed)
 Wt Readings from Last 3 Encounters:  02/12/24 291 lb (132 kg)  12/17/23 293 lb (132.9 kg)  12/11/23 294 lb 9.6 oz (133.6 kg)    Body mass index is 48.42 kg/m.  Patient counseled on low-carb diet Encouraged to engage in regular moderate to vigorous exercise at least 150 minutes weekly as tolerated Benefits of healthy weight discussed Advised to go to the pharmacy to pick up prescription for Mounjaro 

## 2024-02-12 NOTE — Patient Instructions (Addendum)
 Please call gastroenterology office to schedule an appointment  for colonoscopy [phone] option 1   Please let us  know if you have any trouble getting any of your medications  Around 3 times per week, check your blood pressure 2 times per day. once in the morning and once in the evening. The readings should be at least one minute apart. Write down these values and bring them to your next nurse visit/appointment.  When you check your BP, make sure you have been doing something calm/relaxing 5 minutes prior to checking. Both feet should be flat on the floor and you should be sitting. Use your left arm and make sure it is in a relaxed position (on a table), and that the cuff is at the approximate level/height of your heart.      1. Essential hypertension  - amLODipine  (NORVASC ) 10 MG tablet; Take 1 tablet (10 mg total) by mouth daily.  Dispense: 90 tablet; Refill: 1 - hydrochlorothiazide  (HYDRODIURIL ) 25 MG tablet; Take 1 tablet (25 mg total) by mouth daily.  Dispense: 90 tablet; Refill: 1 - valsartan  (DIOVAN ) 80 MG tablet; Take 1 tablet (80 mg total) by mouth daily.  Dispense: 60 tablet; Refill: 1  2. Dyslipidemia, goal LDL below 70  - atorvastatin  (LIPITOR) 20 MG tablet; Take 1 tablet (20 mg total) by mouth daily.  Dispense: 90 tablet; Refill: 3  3. Controlled type 2 diabetes mellitus with complication, without long-term current use of insulin (HCC)  - tirzepatide  (MOUNJARO ) 2.5 MG/0.5ML Pen; Inject 2.5 mg into the skin once a week.  Dispense: 2 mL; Refill: 0  4. Morbid obesity with BMI of 50.0-59.9, adult (HCC)  - tirzepatide  (MOUNJARO ) 2.5 MG/0.5ML Pen; Inject 2.5 mg into the skin once a week.  Dispense: 2 mL; Refill: 0     It is important that you exercise regularly at least 30 minutes 5 times a week as tolerated  Think about what you will eat, plan ahead. Choose  clean, green, fresh or frozen over canned, processed or packaged foods which are more sugary, salty and  fatty. 70 to 75% of food eaten should be vegetables and fruit. Three meals at set times with snacks allowed between meals, but they must be fruit or vegetables. Aim to eat over a 12 hour period , example 7 am to 7 pm, and STOP after  your last meal of the day. Drink water,generally about 64 ounces per day, no other drink is as healthy. Fruit juice is best enjoyed in a healthy way, by EATING the fruit.  Thanks for choosing [facility] we consider it a privelige to serve you.

## 2024-02-17 NOTE — Progress Notes (Deleted)
  4/28 video visit: Patient is here today alone. Pt reports he is drinking 2 bottles daily and states finances get in the way regarding increases water intake. Pt reports has not had time to get to [facility] park to walk. Pt reports she feels he get plenty of walking stating 30 minutes daily indoors at home. Pt states a plan to increase physical activity to 1 hour daily as tolerated Pt denies trying to prepare a new recipe and is willing to attempt. Pt c/o Poor sleep-RD encouraged to discuss poor sleep with PCP for next steps.  Pt denies recent body weights obtained from home scale. Pt states a desire to schedule an in person visit for next follow up in [place]. All Pt's questions were answered during this encounter.     Wt Readings from Last 3 Encounters:  02/12/24 291 lb (132 kg)  12/17/23 293 lb (132.9 kg)  12/11/23 294 lb 9.6 oz (133.6 kg)    Labs:   Lab Results  Component Value Date   HGBA1C 6.0 (A) 12/17/2023   24 hour recall: B: skips  L: [employer] 2 double cheesburgers, fries, diet pepsi or skips  D: fried fish, baked potato, diet dr pepper  Beverages: water, diet dr pepper

## 2024-02-21 ENCOUNTER — Ambulatory Visit: Admitting: Dietician

## 2024-02-26 ENCOUNTER — Other Ambulatory Visit: Payer: Self-pay

## 2024-02-26 DIAGNOSIS — I10 Essential (primary) hypertension: Secondary | ICD-10-CM

## 2024-02-27 LAB — BASIC METABOLIC PANEL WITH GFR
BUN/Creatinine Ratio: 13 (ref 9–20)
BUN: 16 mg/dL (ref 6–24)
CO2: 22 mmol/L (ref 20–29)
Calcium: 9.8 mg/dL (ref 8.7–10.2)
Chloride: 99 mmol/L (ref 96–106)
Creatinine, Ser: 1.27 mg/dL (ref 0.76–1.27)
Glucose: 93 mg/dL (ref 70–99)
Potassium: 3.5 mmol/L (ref 3.5–5.2)
Sodium: 137 mmol/L (ref 134–144)
eGFR: 71 mL/min/1.73 (ref >59)

## 2024-02-28 ENCOUNTER — Telehealth: Payer: Self-pay | Admitting: *Deleted

## 2024-02-28 ENCOUNTER — Ambulatory Visit: Payer: Self-pay | Admitting: Nurse Practitioner

## 2024-02-28 NOTE — Progress Notes (Signed)
 Care Guide Pharmacy Note  02/28/2024 Name: Edward Carr MRN: [id number] DOB: Jul 23, 1976  Referred By: Paseda, Folashade R, FNP Reason for referral: Complex Care Management (Initial outreach to schedule referral with PharmD )   Edward Carr is a 48 y.o. year old male who is a primary care patient of Paseda, Folashade R, FNP.  Edward Carr was referred to the pharmacist for assistance related to: HTN and DMII  Successful contact was made with the patient to discuss pharmacy services.  Patient declines engagement at this time. Contact information was provided to the patient should they wish to reach out for assistance at a later time.  Harlene Satterfield  [facility] Guide  Direct Dial: [phone]  Fax [phone]

## 2024-03-11 ENCOUNTER — Other Ambulatory Visit (HOSPITAL_COMMUNITY): Payer: Self-pay

## 2024-03-11 ENCOUNTER — Other Ambulatory Visit: Payer: Self-pay

## 2024-03-17 ENCOUNTER — Other Ambulatory Visit: Payer: Self-pay

## 2024-03-17 ENCOUNTER — Ambulatory Visit (INDEPENDENT_AMBULATORY_CARE_PROVIDER_SITE_OTHER): Admitting: Physician Assistant

## 2024-03-17 ENCOUNTER — Encounter: Payer: Self-pay | Admitting: Physician Assistant

## 2024-03-17 VITALS — BP 155/104 | HR 75

## 2024-03-17 DIAGNOSIS — L219 Seborrheic dermatitis, unspecified: Secondary | ICD-10-CM

## 2024-03-17 MED ORDER — HYDROCORTISONE 2.5 % EX OINT
TOPICAL_OINTMENT | Freq: Two times a day (BID) | CUTANEOUS | 2 refills | Status: AC
  Filled 2024-03-17: qty 60, 30d supply, fill #0

## 2024-03-17 MED ORDER — KETOCONAZOLE 2 % EX SHAM
1.0000 | MEDICATED_SHAMPOO | Freq: Once | CUTANEOUS | 4 refills | Status: AC
  Filled 2024-03-17: qty 240, 30d supply, fill #0

## 2024-03-17 NOTE — Patient Instructions (Signed)

## 2024-03-17 NOTE — Progress Notes (Signed)
   New Patient Visit   Subjective  Edward Carr is a 48 y.o. male NEW PATIENT who presents for the following: Pt states he has had this rash on his face and neck for about 12 years. Pt states he has not been taking good care of his skin. Pt states he washes with zest or irish spring from head to toe. Pt states he uses Vaseline on his face and neck. Pt states his skin sometimes burns and itch. Has never seen dermatology.     The following portions of the chart were reviewed this encounter and updated as appropriate: medications, allergies, medical history  Review of Systems:  No other skin or systemic complaints except as noted in HPI or Assessment and Plan.  Objective  Well appearing patient in no apparent distress; mood and affect are within normal limits.  A focused examination was performed of the following areas: Scalp, face and back arms and hands  Relevant exam findings are noted in the Assessment and Plan.    Assessment & Plan   Dermatitis/ Seborrheic dermatitis - inadequately controlled.   Exam: Scaly pink patches   Treatment Plan: Wash with Ketoconazole  daily for two week then MWF let sit for a few min then rinse Apply hydrocortisone  2.5 daily for two weeks then MWF   SEBORRHEIC DERMATITIS    Return in about 3 months (around 06/17/2024) for Seb derm.  I, Doyce Pan, CMA, am acting as scribe for Beila Purdie K, PA-C.   Documentation: I have reviewed the above documentation for accuracy and completeness, and I agree with the above.  Stefania Goulart K, PA-C

## 2024-03-20 ENCOUNTER — Ambulatory Visit: Payer: Self-pay | Admitting: Nurse Practitioner

## 2024-03-20 ENCOUNTER — Other Ambulatory Visit (HOSPITAL_COMMUNITY): Payer: Self-pay

## 2024-03-20 ENCOUNTER — Encounter (HOSPITAL_COMMUNITY): Payer: Self-pay

## 2024-03-20 VITALS — BP 110/60 | HR 72 | Wt 289.2 lb

## 2024-03-20 DIAGNOSIS — J4520 Mild intermittent asthma, uncomplicated: Secondary | ICD-10-CM

## 2024-03-20 DIAGNOSIS — E118 Type 2 diabetes mellitus with unspecified complications: Secondary | ICD-10-CM

## 2024-03-20 DIAGNOSIS — I10 Essential (primary) hypertension: Secondary | ICD-10-CM

## 2024-03-20 DIAGNOSIS — Z6841 Body Mass Index (BMI) 40.0 and over, adult: Secondary | ICD-10-CM

## 2024-03-20 LAB — POCT GLYCOSYLATED HEMOGLOBIN (HGB A1C): HbA1c, POC (controlled diabetic range): 5.8 % (ref 0.0–7.0)

## 2024-03-20 MED ORDER — TIRZEPATIDE 2.5 MG/0.5ML ~~LOC~~ SOAJ
2.5000 mg | SUBCUTANEOUS | 1 refills | Status: DC
  Filled 2024-03-20 – 2024-03-23 (×2): qty 2, 28d supply, fill #0

## 2024-03-20 MED ORDER — VALSARTAN 80 MG PO TABS
80.0000 mg | ORAL_TABLET | Freq: Every day | ORAL | 1 refills | Status: DC
  Filled 2024-03-20 – 2024-04-15 (×2): qty 90, 90d supply, fill #0

## 2024-03-20 NOTE — Progress Notes (Signed)
 Established Patient Office Visit  Subjective:  Patient ID: Edward Carr, male    DOB: 03-Apr-1976  Age: 48 y.o. MRN: [id number]  CC:  Chief Complaint  Patient presents with   Hypertension       Discussed the use of AI scribe software for clinical note transcription with the patient, who gave verbal consent to proceed.  History of Present Illness Edward Carr is a 48 year old male  has a past medical history of Asthma, Diabetes mellitus without complication (HCC), Essential hypertension (08/22/2016), Hypertension, and Morbid obesity with BMI of 50.0-59.9, adult (HCC) (12/13/2020).  who presents for follow-up on blood pressure management.  He is currently taking amlodipine  10 mg daily, hydrochlorothiazide  25 mg daily, and valsartan  for hypertension. He has switched to a 90-day supply for his medications.  He manages his diabetes with Mounjaro  2.5 mg once weekly injections, which he recently completed last Monday. He reports no side effects from the medication. He has adjusted the injection site to his leg and finds it effective. He has not been taking metformin  since starting the injections.  He recently experienced shortness of breath, which he manages with his albuterol  inhaler, taking two puffs every six hours as needed.  Has a prescription for Dulera  but he cannot confirm if he has been using the medication, he has a history of asthma. No chest pain, no new shortness of breath, no swelling.  In terms of lifestyle, he has been walking more and is actively trying to reduce his sugar and salt intake.    Past Medical History:  Diagnosis Date   Asthma    Diabetes mellitus without complication (HCC)    Essential hypertension 08/22/2016   Hypertension    Morbid obesity with BMI of 50.0-59.9, adult (HCC) 12/13/2020    Past Surgical History:  Procedure Laterality Date   DENTAL SURGERY      Family History  Problem Relation Age of Onset   Diabetes Mother    Cancer Father     Cancer Sister    Asthma Sister     Social History   Socioeconomic History   Marital status: Single    Spouse name: Not on file   Number of children: Not on file   Years of education: Not on file   Highest education level: Bachelor's degree (e.g., BA, AB, BS)  Occupational History   Not on file  Tobacco Use   Smoking status: Never   Smokeless tobacco: Never  Vaping Use   Vaping status: Never Used  Substance and Sexual Activity   Alcohol use: No   Drug use: No   Sexual activity: Never  Other Topics Concern   Not on file  Social History Narrative   Not on file   Social Drivers of Health   Financial Resource Strain: High Risk (03/20/2024)   Overall Financial Resource Strain (CARDIA)    Difficulty of Paying Living Expenses: Very hard  Food Insecurity: Food Insecurity Present (03/20/2024)   Hunger Vital Sign    Worried About Running Out of Food in the Last Year: Sometimes true    Ran Out of Food in the Last Year: Sometimes true  Transportation Needs: No Transportation Needs (03/20/2024)   PRAPARE - Administrator, Civil Service (Medical): No    Lack of Transportation (Non-Medical): No  Physical Activity: Insufficiently Active (03/20/2024)   Exercise Vital Sign    Days of Exercise per Week: 3 days    Minutes of Exercise per Session: 10 min  Stress: No Stress Concern Present (03/20/2024)   [employer] of Occupational Health - Occupational Stress Questionnaire    Feeling of Stress: Only a little  Social Connections: Socially Isolated (03/20/2024)   Social Connection and Isolation Panel    Frequency of Communication with Friends and Family: Once a week    Frequency of Social Gatherings with Friends and Family: Once a week    Attends Religious Services: Patient declined    Database administrator or Organizations: No    Attends Engineer, structural: Not on file    Marital Status: Never married  Intimate Partner Violence: Not At Risk (02/12/2024)    Humiliation, Afraid, Rape, and Kick questionnaire    Fear of Current or Ex-Partner: No    Emotionally Abused: No    Physically Abused: No    Sexually Abused: No    Outpatient Medications Prior to Visit  Medication Sig Dispense Refill   Albuterol -Budesonide  (AIRSUPRA ) 90-80 MCG/ACT AERO Inhale 2 puffs into the lungs every 4 hours as needed (coughing, wheezing, chest tightness). Do not exceed 12 puffs in 24 hours. 10.7 g 2   amLODipine  (NORVASC ) 10 MG tablet Take 1 tablet (10 mg total) by mouth daily. 90 tablet 1   atorvastatin  (LIPITOR) 20 MG tablet Take 1 tablet (20 mg total) by mouth daily. 90 tablet 3   hydrochlorothiazide  (HYDRODIURIL ) 25 MG tablet Take 1 tablet (25 mg total) by mouth daily. 90 tablet 1   EPINEPHrine  0.3 mg/0.3 mL IJ SOAJ injection Inject 0.3 mg into the muscle as needed for anaphylaxis. (Patient not taking: Reported on 02/12/2024) 2 each 1   hydrocortisone  2.5 % ointment Apply topically 2 (two) times daily. Apply to affected areas daily for two weeks and then 3 times a week MWF thereafter 60 g 2   levocetirizine (XYZAL ) 5 MG tablet Take 1 tablet (5 mg total) by mouth every evening. (Patient not taking: Reported on 02/12/2024) 30 tablet 5   mometasone -formoterol  (DULERA ) 100-5 MCG/ACT AERO Inhale 1-2 puffs into the lungs in the morning and at bedtime. Rinse mouth after each use. (Patient not taking: Reported on 02/12/2024) 13 g 5   triamcinolone  ointment (KENALOG ) 0.5 % Apply 1 application  topically 2 (two) times daily. (Patient not taking: Reported on 02/12/2024) 30 g 0   metFORMIN  (GLUCOPHAGE ) 500 MG tablet Take 1 tablet (500 mg total) by mouth 2 (two) times daily with a meal. (Patient not taking: Reported on 03/20/2024) 180 tablet 1   potassium chloride  SA (KLOR-CON  M) 20 MEQ tablet Take 2 tablets (40 mEq total) by mouth once for 1 dose. (Patient not taking: Reported on 02/12/2024) 2 tablet 0   tirzepatide  (MOUNJARO ) 2.5 MG/0.5ML Pen Inject 2.5 mg into the skin once a week.  (Patient not taking: Reported on 03/20/2024) 2 mL 0   valsartan  (DIOVAN ) 80 MG tablet Take 1 tablet (80 mg total) by mouth daily. 60 tablet 1   No facility-administered medications prior to visit.    Allergies  Allergen Reactions   Crab Extract    Lisinopril      coughing   Shellfish Allergy     Shrimp Extract     ROS Review of Systems  Constitutional:  Negative for appetite change, chills, fatigue and fever.  HENT:  Negative for congestion, postnasal drip, rhinorrhea and sneezing.   Respiratory:  Negative for cough, shortness of breath and wheezing.   Cardiovascular:  Negative for chest pain, palpitations and leg swelling.  Gastrointestinal:  Negative for abdominal pain, constipation, nausea and  vomiting.  Genitourinary:  Negative for difficulty urinating, dysuria, flank pain and frequency.  Musculoskeletal:  Negative for arthralgias, back pain, joint swelling and myalgias.  Skin:  Negative for color change, pallor, rash and wound.  Neurological:  Negative for dizziness, facial asymmetry, weakness, numbness and headaches.  Psychiatric/Behavioral:  Negative for behavioral problems, confusion, self-injury and suicidal ideas.       Objective:    Physical Exam Vitals and nursing note reviewed.  Constitutional:      General: He is not in acute distress.    Appearance: Normal appearance. He is obese. He is not ill-appearing, toxic-appearing or diaphoretic.  Eyes:     General: No scleral icterus.       Right eye: No discharge.        Left eye: No discharge.     Extraocular Movements: Extraocular movements intact.     Conjunctiva/sclera: Conjunctivae normal.  Cardiovascular:     Rate and Rhythm: Normal rate and regular rhythm.     Pulses: Normal pulses.     Heart sounds: Normal heart sounds. No murmur heard.    No friction rub. No gallop.  Pulmonary:     Effort: Pulmonary effort is normal. No respiratory distress.     Breath sounds: Normal breath sounds. No stridor. No  wheezing, rhonchi or rales.  Chest:     Chest wall: No tenderness.  Abdominal:     General: There is no distension.     Palpations: Abdomen is soft.     Tenderness: There is no abdominal tenderness. There is no right CVA tenderness, left CVA tenderness or guarding.  Musculoskeletal:        General: No swelling, tenderness, deformity or signs of injury.     Right lower leg: No edema.     Left lower leg: No edema.  Skin:    General: Skin is warm and dry.     Capillary Refill: Capillary refill takes less than 2 seconds.     Coloration: Skin is not jaundiced or pale.     Findings: No bruising, erythema or lesion.  Neurological:     Mental Status: He is alert and oriented to person, place, and time.     Motor: No weakness.     Coordination: Coordination normal.     Gait: Gait normal.  Psychiatric:        Mood and Affect: Mood normal.        Behavior: Behavior normal.        Thought Content: Thought content normal.        Judgment: Judgment normal.     BP 110/60 (BP Location: Right Arm, Patient Position: Sitting, Cuff Size: Large)   Pulse 72   Wt 289 lb 3.2 oz (131.2 kg)   SpO2 97%   BMI 48.13 kg/m  Wt Readings from Last 3 Encounters:  03/20/24 289 lb 3.2 oz (131.2 kg)  02/12/24 291 lb (132 kg)  12/17/23 293 lb (132.9 kg)    Lab Results  Component Value Date   TSH 1.430 12/11/2019   Lab Results  Component Value Date   WBC 6.6 12/31/2023   HGB 13.8 12/31/2023   HCT 41.3 12/31/2023   MCV 83 12/31/2023   PLT 304 12/31/2023   Lab Results  Component Value Date   NA 137 02/26/2024   K 3.5 02/26/2024   CO2 22 02/26/2024   GLUCOSE 93 02/26/2024   BUN 16 02/26/2024   CREATININE 1.27 02/26/2024   BILITOT 1.0 12/17/2023   ALKPHOS  103 12/17/2023   AST 19 12/17/2023   ALT 19 12/17/2023   PROT 7.4 12/17/2023   ALBUMIN 4.4 12/17/2023   CALCIUM  9.8 02/26/2024   ANIONGAP 14 06/11/2022   EGFR 71 02/26/2024   Lab Results  Component Value Date   CHOL 115 12/17/2023    Lab Results  Component Value Date   HDL 32 (L) 12/17/2023   Lab Results  Component Value Date   LDLCALC 63 12/17/2023   Lab Results  Component Value Date   TRIG 106 12/17/2023   Lab Results  Component Value Date   CHOLHDL 3.6 12/17/2023   Lab Results  Component Value Date   HGBA1C 5.8 03/20/2024      Assessment & Plan:  Assessment and Plan Assessment & Plan    Problem List Items Addressed This Visit       Cardiovascular and Mediastinum   Essential hypertension   BP Readings from Last 3 Encounters:  03/20/24 110/60  03/17/24 (!) 155/104  02/12/24 (!) 146/98   Essential hypertension Blood pressure controlled at 110/60 mmHg with current regimen. - Continue amlodipine  10 mg daily. - Continue hydrochlorothiazide  25 mg daily. - Continue valsartan  80 mg daily.. Discussed DASH diet and dietary sodium restrictions Continue to increase dietary efforts and exercise.         Relevant Medications   valsartan  (DIOVAN ) 80 MG tablet     Respiratory   Asthma    Recent shortness of breath managed with albuterol . - Continue albuterol  90-80 MCG/ACT inhalation 2 puffs every 6 hours as needed.        Endocrine   Controlled type 2 diabetes mellitus with complication (HCC) - Primary   Lab Results  Component Value Date   HGBA1C 5.8 03/20/2024    A1c stable with tirzepatideb 2.5 mg weekly injection. Emphasized lifestyle modifications for weight loss and cardiovascular protection. - Continue tirzepatide  (Mounjaro ) 2.5 mg/0.5 mL injection once a week. - Encourage lifestyle modifications: increase exercise to 30 minutes, 5 days a week, and dietary changes with 70-80% vegetables and protein, less carbohydrates, and portion control.      Relevant Medications   tirzepatide  (MOUNJARO ) 2.5 MG/0.5ML Pen   valsartan  (DIOVAN ) 80 MG tablet   Other Relevant Orders   POCT glycosylated hemoglobin (Hb A1C) (Completed)     Other   Morbid obesity with BMI of 50.0-59.9, adult (HCC)    Wt Readings from Last 3 Encounters:  03/20/24 289 lb 3.2 oz (131.2 kg)  02/12/24 291 lb (132 kg)  12/17/23 293 lb (132.9 kg)    Focus on weight loss through lifestyle modifications. Tirzepatide  expected to aid weight loss and provide cardiovascular and renal protection. - Encourage weight loss through diet and exercise as outlined in diabetes management.      Relevant Medications   tirzepatide  (MOUNJARO ) 2.5 MG/0.5ML Pen    Meds ordered this encounter  Medications   tirzepatide  (MOUNJARO ) 2.5 MG/0.5ML Pen    Sig: Inject 2.5 mg into the skin once a week.    Dispense:  6 mL    Refill:  1   valsartan  (DIOVAN ) 80 MG tablet    Sig: Take 1 tablet (80 mg total) by mouth daily.    Dispense:  90 tablet    Refill:  1    Follow-up: No follow-ups on file.    Elexis Pollak R Kahealani Yankovich, FNP

## 2024-03-20 NOTE — Assessment & Plan Note (Signed)
 Lab Results  Component Value Date   HGBA1C 5.8 03/20/2024    A1c stable with tirzepatideb 2.5 mg weekly injection. Emphasized lifestyle modifications for weight loss and cardiovascular protection. - Continue tirzepatide  (Mounjaro ) 2.5 mg/0.5 mL injection once a week. - Encourage lifestyle modifications: increase exercise to 30 minutes, 5 days a week, and dietary changes with 70-80% vegetables and protein, less carbohydrates, and portion control.

## 2024-03-20 NOTE — Assessment & Plan Note (Signed)
  Recent shortness of breath managed with albuterol . - Continue albuterol  90-80 MCG/ACT inhalation 2 puffs every 6 hours as needed.

## 2024-03-20 NOTE — Assessment & Plan Note (Signed)
 BP Readings from Last 3 Encounters:  03/20/24 110/60  03/17/24 (!) 155/104  02/12/24 (!) 146/98   Essential hypertension Blood pressure controlled at 110/60 mmHg with current regimen. - Continue amlodipine  10 mg daily. - Continue hydrochlorothiazide  25 mg daily. - Continue valsartan  80 mg daily.. Discussed DASH diet and dietary sodium restrictions Continue to increase dietary efforts and exercise.

## 2024-03-20 NOTE — Assessment & Plan Note (Signed)
 Wt Readings from Last 3 Encounters:  03/20/24 289 lb 3.2 oz (131.2 kg)  02/12/24 291 lb (132 kg)  12/17/23 293 lb (132.9 kg)    Focus on weight loss through lifestyle modifications. Tirzepatide  expected to aid weight loss and provide cardiovascular and renal protection. - Encourage weight loss through diet and exercise as outlined in diabetes management.

## 2024-03-20 NOTE — Patient Instructions (Signed)
 1. Controlled type 2 diabetes mellitus with complication, without long-term current use of insulin (HCC) (Primary)  - POCT glycosylated hemoglobin (Hb A1C) - tirzepatide  (MOUNJARO ) 2.5 MG/0.5ML Pen; Inject 2.5 mg into the skin once a week.  Dispense: 6 mL; Refill: 1  2. Morbid obesity with BMI of 50.0-59.9, adult (HCC)  - tirzepatide  (MOUNJARO ) 2.5 MG/0.5ML Pen; Inject 2.5 mg into the skin once a week.  Dispense: 6 mL; Refill: 1  3. Essential hypertension  - valsartan  (DIOVAN ) 80 MG tablet; Take 1 tablet (80 mg total) by mouth daily.  Dispense: 90 tablet; Refill: 1     It is important that you exercise regularly at least 30 minutes 5 times a week as tolerated  Think about what you will eat, plan ahead. Choose  clean, green, fresh or frozen over canned, processed or packaged foods which are more sugary, salty and fatty. 70 to 75% of food eaten should be vegetables and fruit. Three meals at set times with snacks allowed between meals, but they must be fruit or vegetables. Aim to eat over a 12 hour period , example 7 am to 7 pm, and STOP after  your last meal of the day. Drink water,generally about 64 ounces per day, no other drink is as healthy. Fruit juice is best enjoyed in a healthy way, by EATING the fruit.  Thanks for choosing [facility] we consider it a privelige to serve you.

## 2024-03-23 ENCOUNTER — Other Ambulatory Visit: Payer: Self-pay

## 2024-03-31 ENCOUNTER — Other Ambulatory Visit (HOSPITAL_COMMUNITY): Payer: Self-pay

## 2024-04-13 ENCOUNTER — Other Ambulatory Visit: Payer: Self-pay

## 2024-04-13 ENCOUNTER — Other Ambulatory Visit: Payer: Self-pay | Admitting: Nurse Practitioner

## 2024-04-13 DIAGNOSIS — E118 Type 2 diabetes mellitus with unspecified complications: Secondary | ICD-10-CM

## 2024-04-13 MED ORDER — TIRZEPATIDE 2.5 MG/0.5ML ~~LOC~~ SOAJ
2.5000 mg | SUBCUTANEOUS | 1 refills | Status: AC
  Filled 2024-04-13 – 2024-04-15 (×3): qty 2, 28d supply, fill #0

## 2024-04-14 ENCOUNTER — Encounter (HOSPITAL_COMMUNITY): Payer: Self-pay

## 2024-04-14 ENCOUNTER — Other Ambulatory Visit (HOSPITAL_COMMUNITY): Payer: Self-pay

## 2024-04-14 NOTE — Progress Notes (Unsigned)
 Follow Up Note  RE: Edward Carr MRN: [id number] DOB: 12-20-1975 Date of Office Visit: 04/15/2024  Referring provider: Paseda, Folashade R, FNP Primary care provider: Paseda, Folashade R, FNP  Chief Complaint: No chief complaint on file.  History of Present Illness: I had the pleasure of seeing Edward Carr for a follow up visit at the [facility] on 04/15/2024. He is a 48 y.o. male, who is being followed for asthma, allergic rhinoconjunctivitis, adverse food reaction and facial rash. His previous allergy  office visit was on 12/11/2023 with Dr. Luke. Today is a regular follow up visit.  Discussed the use of AI scribe software for clinical note transcription with the patient, who gave verbal consent to proceed.  History of Present Illness            ***  Assessment and Plan: Edward Carr is a 48 y.o. male with: Moderate persistent asthma without complication Past history - asthma like symptoms for 30+ years. Main triggers are exertion and panic attacks. Uses albuterol  once a week with good benefit. 2025 spirometry showed mixed obstructive and restrictive disease with 78% improvement in FEV1 post bronchodilator treatment. Clinically feeling improved.  Interim history - unable to pick up Breo. Using Airsupra  once a month or so.  Today's spirometry showed some restriction. Daily controller medication(s): Dulera  100mcg 1 puff twice a day and rinse mouth after each use. If it's not covered let us  know.  May use Airsupra  rescue inhaler 2 puffs every 4 to 6 hours as needed for shortness of breath, chest tightness, coughing, and wheezing. Do not use more than 12 puffs in 24 hours. May use Airsupra  rescue inhaler 2 puffs 5 to 15 minutes prior to strenuous physical activities. Rinse mouth after each use.  Monitor frequency of use - if you need to use it more than twice per week on a consistent basis let us  know.  Get spirometry at next visit.   Seasonal and perennial allergic  rhinoconjunctivitis Past history - 2022 skin testing positive to grass, trees, ragweed, weed pollen, dust mites, cockroach. Borderline to cat and dog.  Interim history - stable without any meds.  Continue environmental control measures. Use over the counter antihistamines such as Zyrtec  (cetirizine ), Claritin (loratadine), Allegra (fexofenadine), or Xyzal  (levocetirizine) daily as needed. May take twice a day during allergy  flares. May switch antihistamines every few months.   Other adverse food reactions, not elsewhere classified, subsequent encounter Past history - delayed reaction to shrimp as a child in the form of right eye swelling. Resolved without any intervention. Then had another episode of eye swelling as an adult but not sure of shellfish exposure. Tolerates finned fish with no issues. 2022 skin testing positive to shellfish, shrimp, crab, lobster, oyster.  Interim history - unable to get neffy . Continue strict avoidance of shellfish and mollusks I have prescribed epinephrine  injectable device and demonstrated proper use. For mild symptoms you can take over the counter antihistamines such as Benadryl 1-2 tablets = 25-50mg  and monitor symptoms closely. If symptoms worsen or if you have severe symptoms including breathing issues, throat closure, significant swelling, whole body hives, severe diarrhea and vomiting, lightheadedness then inject epinephrine  and seek immediate medical care afterwards. Emergency action plan in place.    Facial rash Unchanged. Didn't see derm yet. Monitor symptoms.  Continue proper skin care.  Keep dermatology appointment.    Essential hypertension Please follow up with PCP regarding this.   Assessment and Plan  No follow-ups on file.  No orders of the defined types were placed in this encounter.  Lab Orders  No laboratory test(s) ordered today    Diagnostics: Spirometry:  Tracings reviewed. His effort: {Blank single:19197::Good  reproducible efforts.,It was hard to get consistent efforts and there is a question as to whether this reflects a maximal maneuver.,Poor effort, data can not be interpreted.} FVC: ***L FEV1: ***L, ***% predicted FEV1/FVC ratio: ***% Interpretation: {Blank single:19197::Spirometry consistent with mild obstructive disease,Spirometry consistent with moderate obstructive disease,Spirometry consistent with severe obstructive disease,Spirometry consistent with possible restrictive disease,Spirometry consistent with mixed obstructive and restrictive disease,Spirometry uninterpretable due to technique,Spirometry consistent with normal pattern,No overt abnormalities noted given today's efforts}.  Please see scanned spirometry results for details.  Skin Testing: {Blank single:19197::Select foods,Environmental allergy  panel,Environmental allergy  panel and select foods,Food allergy  panel,None,Deferred due to recent antihistamines use}. *** Results discussed with patient/family.   Medication List:  Current Outpatient Medications  Medication Sig Dispense Refill   Albuterol -Budesonide  (AIRSUPRA ) 90-80 MCG/ACT AERO Inhale 2 puffs into the lungs every 4 hours as needed (coughing, wheezing, chest tightness). Do not exceed 12 puffs in 24 hours. 10.7 g 2   amLODipine  (NORVASC ) 10 MG tablet Take 1 tablet (10 mg total) by mouth daily. 90 tablet 1   atorvastatin  (LIPITOR) 20 MG tablet Take 1 tablet (20 mg total) by mouth daily. 90 tablet 3   EPINEPHrine  0.3 mg/0.3 mL IJ SOAJ injection Inject 0.3 mg into the muscle as needed for anaphylaxis. (Patient not taking: Reported on 02/12/2024) 2 each 1   hydrochlorothiazide  (HYDRODIURIL ) 25 MG tablet Take 1 tablet (25 mg total) by mouth daily. 90 tablet 1   hydrocortisone  2.5 % ointment Apply topically 2 (two) times daily. Apply to affected areas daily for two weeks and then 3 times a week MWF thereafter 60 g 2   levocetirizine (XYZAL ) 5 MG  tablet Take 1 tablet (5 mg total) by mouth every evening. (Patient not taking: Reported on 02/12/2024) 30 tablet 5   mometasone -formoterol  (DULERA ) 100-5 MCG/ACT AERO Inhale 1-2 puffs into the lungs in the morning and at bedtime. Rinse mouth after each use. (Patient not taking: Reported on 02/12/2024) 13 g 5   tirzepatide  (MOUNJARO ) 2.5 MG/0.5ML Pen Inject 2.5 mg into the skin once a week. 6 mL 1   triamcinolone  ointment (KENALOG ) 0.5 % Apply 1 application  topically 2 (two) times daily. (Patient not taking: Reported on 02/12/2024) 30 g 0   valsartan  (DIOVAN ) 80 MG tablet Take 1 tablet (80 mg total) by mouth daily. 90 tablet 1   No current facility-administered medications for this visit.   Allergies: Allergies  Allergen Reactions   Crab Extract    Lisinopril      coughing   Shellfish Allergy     Shrimp Extract    I reviewed his past medical history, social history, family history, and environmental history and no significant changes have been reported from his previous visit.  Review of Systems  Constitutional:  Negative for appetite change, chills, fever and unexpected weight change.  HENT:  Negative for congestion, postnasal drip and rhinorrhea.   Eyes:  Negative for itching.  Respiratory:  Negative for cough, chest tightness, shortness of breath and wheezing.   Cardiovascular:  Negative for chest pain.  Gastrointestinal:  Negative for abdominal pain.  Genitourinary:  Negative for difficulty urinating.  Skin:  Positive for rash.  Allergic/Immunologic: Positive for environmental allergies and food allergies.  Neurological:  Negative for headaches.    Objective: There were no vitals taken  for this visit. There is no height or weight on file to calculate BMI. Physical Exam Vitals and nursing note reviewed.  Constitutional:      Appearance: Normal appearance. He is well-developed. He is obese.  HENT:     Head: Normocephalic and atraumatic.     Right Ear: Tympanic membrane and  external ear normal.     Left Ear: Tympanic membrane and external ear normal.     Nose: Nose normal.     Mouth/Throat:     Mouth: Mucous membranes are moist.     Pharynx: Oropharynx is clear.  Eyes:     Conjunctiva/sclera: Conjunctivae normal.  Cardiovascular:     Rate and Rhythm: Normal rate and regular rhythm.     Heart sounds: Normal heart sounds. No murmur heard.    No friction rub. No gallop.  Pulmonary:     Effort: Pulmonary effort is normal.     Breath sounds: Normal breath sounds. No wheezing, rhonchi or rales.  Musculoskeletal:     Cervical back: Neck supple.  Skin:    General: Skin is warm.     Findings: Rash present.     Comments: Erythematous hue on the face - unchanged  Neurological:     Mental Status: He is alert and oriented to person, place, and time.  Psychiatric:        Behavior: Behavior normal.    Previous notes and tests were reviewed. The plan was reviewed with the patient/family, and all questions/concerned were addressed.  It was my pleasure to see Edward Carr today and participate in his care. Please feel free to contact me with any questions or concerns.  Sincerely,  Orlan Cramp, DO Allergy  & Immunology  [facility]  [place] office: [phone] [facility] office: [phone]

## 2024-04-15 ENCOUNTER — Other Ambulatory Visit: Payer: Self-pay

## 2024-04-15 ENCOUNTER — Encounter (HOSPITAL_COMMUNITY): Payer: Self-pay

## 2024-04-15 ENCOUNTER — Encounter: Payer: Self-pay | Admitting: Allergy

## 2024-04-15 ENCOUNTER — Ambulatory Visit (INDEPENDENT_AMBULATORY_CARE_PROVIDER_SITE_OTHER): Admitting: Allergy

## 2024-04-15 ENCOUNTER — Other Ambulatory Visit (HOSPITAL_COMMUNITY): Payer: Self-pay

## 2024-04-15 ENCOUNTER — Other Ambulatory Visit: Payer: Self-pay | Admitting: Nurse Practitioner

## 2024-04-15 ENCOUNTER — Telehealth: Payer: Self-pay

## 2024-04-15 VITALS — BP 114/68 | HR 84 | Temp 98.2°F | Resp 18 | Ht 65.0 in | Wt 294.3 lb

## 2024-04-15 DIAGNOSIS — T781XXD Other adverse food reactions, not elsewhere classified, subsequent encounter: Secondary | ICD-10-CM | POA: Diagnosis not present

## 2024-04-15 DIAGNOSIS — J302 Other seasonal allergic rhinitis: Secondary | ICD-10-CM

## 2024-04-15 DIAGNOSIS — H1013 Acute atopic conjunctivitis, bilateral: Secondary | ICD-10-CM

## 2024-04-15 DIAGNOSIS — E118 Type 2 diabetes mellitus with unspecified complications: Secondary | ICD-10-CM

## 2024-04-15 DIAGNOSIS — J4540 Moderate persistent asthma, uncomplicated: Secondary | ICD-10-CM | POA: Diagnosis not present

## 2024-04-15 DIAGNOSIS — R21 Rash and other nonspecific skin eruption: Secondary | ICD-10-CM

## 2024-04-15 DIAGNOSIS — H1010 Acute atopic conjunctivitis, unspecified eye: Secondary | ICD-10-CM

## 2024-04-15 DIAGNOSIS — J3089 Other allergic rhinitis: Secondary | ICD-10-CM

## 2024-04-15 MED ORDER — EPINEPHRINE 0.3 MG/0.3ML IJ SOAJ
0.3000 mg | INTRAMUSCULAR | 1 refills | Status: AC | PRN
  Filled 2024-04-15: qty 2, 30d supply, fill #0

## 2024-04-15 MED ORDER — AIRSUPRA 90-80 MCG/ACT IN AERO
2.0000 | INHALATION_SPRAY | RESPIRATORY_TRACT | 2 refills | Status: DC | PRN
  Filled 2024-04-15 – 2024-07-17 (×2): qty 10.7, 15d supply, fill #0

## 2024-04-15 MED ORDER — BUDESONIDE-FORMOTEROL FUMARATE 80-4.5 MCG/ACT IN AERO
2.0000 | INHALATION_SPRAY | Freq: Two times a day (BID) | RESPIRATORY_TRACT | 5 refills | Status: DC
  Filled 2024-04-15 – 2024-04-20 (×2): qty 10.2, 30d supply, fill #0

## 2024-04-15 NOTE — Telephone Encounter (Signed)
*  [facility]  Pharmacy Patient Advocate Encounter   Received notification from CoverMyMeds that prior authorization for Airsupra  is required/requested.   Insurance verification completed.   The patient is insured through [employer] .   Per test claim: PA required; PA started via CoverMyMeds. KEY [id number] . Please see clinical question(s) below that I am not finding the answer to in their chart and advise.   Has the patient tried ONE albuterol -containing inhaler OR levalbuterol-containing inhaler taken concomitantly with one single-entity inhaled corticosteroid? Note: Albuterol -containing inhalers include ProAir  HFA, Proventil  HFA, albuterol  HFA, Ventolin  HFA. Levalbuterol-containing inhalers include Xopenex HFA and levalbuterol HFA. Examples of single-entity inhalers containing corticosteroids: Alvesco, Arnuity Ellipta , Asmanex  HFA, Asmanex  Twisthaler, Flovent Diskus, Flovent HFA, Pulmicort  Flexhaler, Qvar RediHaler.

## 2024-04-15 NOTE — Patient Instructions (Addendum)
 Environmental allergies: 2022 skin testing positive to grass, trees, ragweed, weed pollen, dust mites, cockroach. Borderline to cat and dog.  Continue environmental control measures. Use over the counter antihistamines such as Zyrtec  (cetirizine ), Claritin (loratadine), Allegra (fexofenadine), or Xyzal  (levocetirizine) daily as needed. May take twice a day during allergy  flares. May switch antihistamines every few months.  Food allergy : Continue strict avoidance of shellfish and mollusks For mild symptoms you can take over the counter antihistamines (zyrtec  10mg  to 20mg ) and monitor symptoms closely.  If symptoms worsen or if you have severe symptoms including breathing issues, throat closure, significant swelling, whole body hives, severe diarrhea and vomiting, lightheadedness then use epinephrine  and seek immediate medical care afterwards. Emergency action plan in place.   Asthma:  Daily controller medication(s): start Breyna  80mcg 2 puffs twice a day with spacer and rinse mouth afterwards. If it's not covered let us  know.  May use Airsupra  rescue inhaler 2 puffs every 4 to 6 hours as needed for shortness of breath, chest tightness, coughing, and wheezing. Do not use more than 12 puffs in 24 hours. May use Airsupra  rescue inhaler 2 puffs 5 to 15 minutes prior to strenuous physical activities. Rinse mouth after each use.  Monitor frequency of use - if you need to use it more than twice per week on a consistent basis let us  know.  Breathing control goals:  Full participation in all desired activities (may need albuterol  before activity) Albuterol  use two times or less a week on average (not counting use with activity) Cough interfering with sleep two times or less a month Oral steroids no more than once a year No hospitalizations Skin: Monitor symptoms.  Continue proper skin care.  Keep dermatology appointment in November.   Return in about 4 months (around 08/15/2024). Or sooner if needed.    Skin care recommendations  Bath time: Always use lukewarm water. AVOID very hot or cold water. Keep bathing time to 5-10 minutes. Do NOT use bubble bath. Use a mild soap and use just enough to wash the dirty areas. Do NOT scrub skin vigorously.  After bathing, pat dry your skin with a towel. Do NOT rub or scrub the skin.  Moisturizers and prescriptions:  ALWAYS apply moisturizers immediately after bathing (within 3 minutes). This helps to lock-in moisture. Use the moisturizer several times a day over the whole body. Good summer moisturizers include: Aveeno, CeraVe, Cetaphil. Good winter moisturizers include: Aquaphor, Vaseline, Cerave, Cetaphil, Eucerin, Vanicream. When using moisturizers along with medications, the moisturizer should be applied about one hour after applying the medication to prevent diluting effect of the medication or moisturize around where you applied the medications. When not using medications, the moisturizer can be continued twice daily as maintenance.  Laundry and clothing: Avoid laundry products with added color or perfumes. Use unscented hypo-allergenic laundry products such as Tide free, Cheer free & gentle, and All free and clear.  If the skin still seems dry or sensitive, you can try double-rinsing the clothes. Avoid tight or scratchy clothing such as wool. Do not use fabric softeners or dyer sheets.

## 2024-04-16 ENCOUNTER — Other Ambulatory Visit (HOSPITAL_COMMUNITY): Payer: Self-pay

## 2024-04-16 MED ORDER — ALBUTEROL SULFATE HFA 108 (90 BASE) MCG/ACT IN AERS
2.0000 | INHALATION_SPRAY | RESPIRATORY_TRACT | 1 refills | Status: DC | PRN
  Filled 2024-04-16 – 2024-07-28 (×2): qty 6.7, 20d supply, fill #0

## 2024-04-16 NOTE — Telephone Encounter (Signed)
 Sending in albuterol   May use albuterol  rescue inhaler 2 puffs every 4 to 6 hours as needed for shortness of breath, chest tightness, coughing, and wheezing. Monitor frequency of use - if you need to use it more than twice per week on a consistent basis let us  know.

## 2024-04-17 ENCOUNTER — Other Ambulatory Visit (HOSPITAL_COMMUNITY): Payer: Self-pay

## 2024-04-20 ENCOUNTER — Other Ambulatory Visit (HOSPITAL_COMMUNITY): Payer: Self-pay

## 2024-04-20 MED ORDER — FLUTICASONE FUROATE-VILANTEROL 100-25 MCG/ACT IN AEPB
INHALATION_SPRAY | RESPIRATORY_TRACT | 0 refills | Status: DC
  Filled 2024-04-20: qty 60, 30d supply, fill #0

## 2024-04-21 ENCOUNTER — Other Ambulatory Visit (HOSPITAL_COMMUNITY): Payer: Self-pay

## 2024-04-22 ENCOUNTER — Other Ambulatory Visit: Payer: Self-pay

## 2024-04-24 ENCOUNTER — Other Ambulatory Visit: Payer: Self-pay

## 2024-04-26 ENCOUNTER — Other Ambulatory Visit (HOSPITAL_COMMUNITY): Payer: Self-pay

## 2024-04-27 ENCOUNTER — Encounter (HOSPITAL_COMMUNITY): Payer: Self-pay

## 2024-04-27 ENCOUNTER — Other Ambulatory Visit (HOSPITAL_COMMUNITY): Payer: Self-pay

## 2024-04-28 ENCOUNTER — Other Ambulatory Visit: Payer: Self-pay

## 2024-04-28 ENCOUNTER — Other Ambulatory Visit (HOSPITAL_COMMUNITY): Payer: Self-pay

## 2024-05-01 ENCOUNTER — Other Ambulatory Visit: Payer: Self-pay

## 2024-05-01 ENCOUNTER — Other Ambulatory Visit (HOSPITAL_COMMUNITY): Payer: Self-pay

## 2024-05-01 ENCOUNTER — Other Ambulatory Visit: Payer: Self-pay | Admitting: Nurse Practitioner

## 2024-05-01 DIAGNOSIS — E118 Type 2 diabetes mellitus with unspecified complications: Secondary | ICD-10-CM

## 2024-05-01 MED ORDER — METFORMIN HCL 500 MG PO TABS
500.0000 mg | ORAL_TABLET | Freq: Two times a day (BID) | ORAL | 1 refills | Status: DC
  Filled 2024-05-01: qty 60, 30d supply, fill #0
  Filled 2024-06-04: qty 60, 30d supply, fill #1
  Filled 2024-07-14: qty 60, 30d supply, fill #2
  Filled 2024-08-11: qty 60, 30d supply, fill #3

## 2024-05-07 ENCOUNTER — Other Ambulatory Visit (HOSPITAL_COMMUNITY): Payer: Self-pay

## 2024-06-04 ENCOUNTER — Other Ambulatory Visit: Payer: Self-pay

## 2024-06-05 ENCOUNTER — Other Ambulatory Visit: Payer: Self-pay

## 2024-06-17 ENCOUNTER — Ambulatory Visit: Admitting: Physician Assistant

## 2024-07-17 ENCOUNTER — Other Ambulatory Visit (HOSPITAL_COMMUNITY): Payer: Self-pay

## 2024-07-20 ENCOUNTER — Other Ambulatory Visit (HOSPITAL_COMMUNITY): Payer: Self-pay

## 2024-07-20 ENCOUNTER — Ambulatory Visit: Payer: Self-pay | Admitting: Nurse Practitioner

## 2024-07-20 VITALS — BP 128/97 | HR 73 | Temp 98.1°F | Wt 314.6 lb

## 2024-07-20 DIAGNOSIS — E785 Hyperlipidemia, unspecified: Secondary | ICD-10-CM | POA: Diagnosis not present

## 2024-07-20 DIAGNOSIS — E118 Type 2 diabetes mellitus with unspecified complications: Secondary | ICD-10-CM

## 2024-07-20 DIAGNOSIS — Z1211 Encounter for screening for malignant neoplasm of colon: Secondary | ICD-10-CM | POA: Diagnosis not present

## 2024-07-20 DIAGNOSIS — Z6841 Body Mass Index (BMI) 40.0 and over, adult: Secondary | ICD-10-CM | POA: Diagnosis not present

## 2024-07-20 DIAGNOSIS — Z23 Encounter for immunization: Secondary | ICD-10-CM

## 2024-07-20 DIAGNOSIS — I10 Essential (primary) hypertension: Secondary | ICD-10-CM | POA: Diagnosis not present

## 2024-07-20 LAB — POCT GLYCOSYLATED HEMOGLOBIN (HGB A1C): Hemoglobin A1C: 6.2 % — AB (ref 4.0–5.6)

## 2024-07-20 MED ORDER — HYDROCHLOROTHIAZIDE 25 MG PO TABS
25.0000 mg | ORAL_TABLET | Freq: Every day | ORAL | 1 refills | Status: AC
  Filled 2024-07-20 – 2024-08-11 (×2): qty 90, 90d supply, fill #0

## 2024-07-20 MED ORDER — AMLODIPINE BESYLATE 10 MG PO TABS
10.0000 mg | ORAL_TABLET | Freq: Every day | ORAL | 1 refills | Status: AC
  Filled 2024-07-20 – 2024-08-11 (×2): qty 90, 90d supply, fill #0

## 2024-07-20 MED ORDER — VALSARTAN 80 MG PO TABS
80.0000 mg | ORAL_TABLET | Freq: Every day | ORAL | 1 refills | Status: DC
  Filled 2024-07-20: qty 30, 30d supply, fill #0
  Filled 2024-08-11: qty 30, 30d supply, fill #1

## 2024-07-20 NOTE — Assessment & Plan Note (Addendum)
 Managed with metformin  500 mg twice daily. Interest in tirzepatide  for weight loss and diabetes control, pending affordability and insurance. His current insurance will be inactive soon, will restart mounjaro  once he gets a new insurance  Continue metformin  500 mg twice daily - Advised to reschedule eye exam once insurance is renewed.

## 2024-07-20 NOTE — Assessment & Plan Note (Signed)
 On atorvastatin  20 mg daily Rechecking lipid panel Lab Results  Component Value Date   CHOL 115 12/17/2023   HDL 32 (L) 12/17/2023   LDLCALC 63 12/17/2023   TRIG 106 12/17/2023   CHOLHDL 3.6 12/17/2023

## 2024-07-20 NOTE — Patient Instructions (Signed)
Around 3 times per week, check your blood pressure 2 times per day. once in the morning and once in the evening. The readings should be at least one minute apart. Write down these values and bring them to your next nurse visit/appointment.  When you check your BP, make sure you have been doing something calm/relaxing 5 minutes prior to checking. Both feet should be flat on the floor and you should be sitting. Use your left arm and make sure it is in a relaxed position (on a table), and that the cuff is at the approximate level/height of your heart.   Blood pressure goal is less than 130/80.    It is important that you exercise regularly at least 30 minutes 5 times a week as tolerated  Think about what you will eat, plan ahead. Choose " clean, green, fresh or frozen" over canned, processed or packaged foods which are more sugary, salty and fatty. 70 to 75% of food eaten should be vegetables and fruit. Three meals at set times with snacks allowed between meals, but they must be fruit or vegetables. Aim to eat over a 12 hour period , example 7 am to 7 pm, and STOP after  your last meal of the day. Drink water,generally about 64 ounces per day, no other drink is as healthy. Fruit juice is best enjoyed in a healthy way, by EATING the fruit.  Thanks for choosing [facility] we consider it a privelige to serve you.

## 2024-07-20 NOTE — Assessment & Plan Note (Addendum)
 Diastolic blood pressure is elevated but has been previously well-controlled ,continue amlodipine  10 mg daily, hydrochlorothiazide  25 mg daily, valsartan  80 mg daily No changes made to medication today will recheck blood pressure in 4 weeks and adjust medication if blood pressure remains elevated DASH diet and commitment to daily physical activity for a minimum of 30 minutes discussed and encouraged, as a part of hypertension management. The importance of attaining a healthy weight is also discussed.     07/20/2024    9:44 AM 04/15/2024    1:31 PM 03/20/2024    9:05 AM 03/17/2024    1:14 PM 02/12/2024   10:24 AM 02/12/2024   10:14 AM 12/17/2023    2:50 PM  BP/Weight  Systolic BP 129 114 110 155 146 146 136  Diastolic BP 87 68 60 104 98 98 94  Wt. (Lbs) 314.6 294.3 289.2   291   BMI 52.35 kg/m2 48.97 kg/m2 48.13 kg/m2   48.42 kg/m2

## 2024-07-20 NOTE — Assessment & Plan Note (Signed)
 Wt Readings from Last 3 Encounters:  07/20/24 (!) 314 lb 9.6 oz (142.7 kg)  04/15/24 294 lb 4.8 oz (133.5 kg)  03/20/24 289 lb 3.2 oz (131.2 kg)   Body mass index is 52.35 kg/m.  Patient counseled on low-carb diet Encouraged regular moderate to vigorous exercise at least 150 minutes weekly as tolerated

## 2024-07-20 NOTE — Progress Notes (Signed)
 "  Established Patient Office Visit  Subjective:  Patient ID: Edward Carr, male    DOB: 1976-07-05  Age: 48 y.o. MRN: [id number]  CC:  Chief Complaint  Patient presents with   Sickle Cell Anemia    4 month f/u.    HPI    Discussed the use of AI scribe software for clinical note transcription with the patient, who gave verbal consent to proceed.  History of Present Illness Edward Carr is a 48 year old male  has a past medical history of Asthma, Diabetes mellitus without complication (HCC), Essential hypertension (08/22/2016), Hypertension, and Morbid obesity with BMI of 50.0-59.9, adult (HCC) (12/13/2020).  who presents for a follow-up visit.  He is concerned about weight gain despite efforts to lose weight by eating less and walking. He is currently not using insurance due to affordability issues, which affects his ability to access certain medications and appointments.  He is taking amlodipine  10 mg daily, hydrochlorothiazide  25 mg daily, and valsartan  80 mg daily for hypertension.  Currently denies chest pain shortness of breath edema.  He has access to a blood pressure monitor at home, which belongs to his mother.   He also takes metformin  500 mg twice daily for diabetes.  Takes atorvastatin  20 mg daily for hyperlipidemia He has not been able to obtain Mounjaro  due to insurance and cost issues, although he is interested in trying it if affordable.  He has not seen an eye doctor in the past year due to the inability to afford the copay.       Assessment & Plan        Past Medical History:  Diagnosis Date   Asthma    Diabetes mellitus without complication (HCC)    Essential hypertension 08/22/2016   Hypertension    Morbid obesity with BMI of 50.0-59.9, adult (HCC) 12/13/2020    Past Surgical History:  Procedure Laterality Date   DENTAL SURGERY      Family History  Problem Relation Age of Onset   Diabetes Mother    Cancer Father    Cancer Sister    Asthma  Sister     Social History   Socioeconomic History   Marital status: Single    Spouse name: Not on file   Number of children: Not on file   Years of education: Not on file   Highest education level: Bachelor's degree (e.g., BA, AB, BS)  Occupational History   Not on file  Tobacco Use   Smoking status: Never   Smokeless tobacco: Never  Vaping Use   Vaping status: Never Used  Substance and Sexual Activity   Alcohol use: No   Drug use: No   Sexual activity: Never  Other Topics Concern   Not on file  Social History Narrative   Not on file   Social Drivers of Health   Tobacco Use: Low Risk (07/20/2024)   Patient History    Smoking Tobacco Use: Never    Smokeless Tobacco Use: Never    Passive Exposure: Not on file  Financial Resource Strain: Medium Risk (07/20/2024)   Overall Financial Resource Strain (CARDIA)    Difficulty of Paying Living Expenses: Somewhat hard  Food Insecurity: No Food Insecurity (07/20/2024)   Epic    Worried About Radiation Protection Practitioner of Food in the Last Year: Never true    Ran Out of Food in the Last Year: Never true  Recent Concern: Food Insecurity - Food Insecurity Present (07/20/2024)   Epic    Worried  About Running Out of Food in the Last Year: Sometimes true    Ran Out of Food in the Last Year: Sometimes true  Transportation Needs: No Transportation Needs (07/20/2024)   Epic    Lack of Transportation (Medical): No    Lack of Transportation (Non-Medical): No  Physical Activity: Insufficiently Active (07/20/2024)   Exercise Vital Sign    Days of Exercise per Week: 3 days    Minutes of Exercise per Session: 10 min  Stress: No Stress Concern Present (07/20/2024)   [employer] of Occupational Health - Occupational Stress Questionnaire    Feeling of Stress: Only a little  Social Connections: Unknown (07/20/2024)   Social Connection and Isolation Panel    Frequency of Communication with Friends and Family: Patient declined    Frequency of Social  Gatherings with Friends and Family: Once a week    Attends Religious Services: Never    Database Administrator or Organizations: No    Attends Engineer, Structural: Not on file    Marital Status: Never married  Intimate Partner Violence: Not At Risk (07/20/2024)   Epic    Fear of Current or Ex-Partner: No    Emotionally Abused: No    Physically Abused: No    Sexually Abused: No  Depression (PHQ2-9): Low Risk (07/20/2024)   Depression (PHQ2-9)    PHQ-2 Score: 0  Alcohol Screen: Not on file  Housing: Low Risk (07/20/2024)   Epic    Unable to Pay for Housing in the Last Year: No    Number of Times Moved in the Last Year: 0    Homeless in the Last Year: No  Utilities: Not At Risk (07/20/2024)   Epic    Threatened with loss of utilities: No  Health Literacy: Not on file    Outpatient Medications Prior to Visit  Medication Sig Dispense Refill   Albuterol -Budesonide  (AIRSUPRA ) 90-80 MCG/ACT AERO Inhale 2 puffs into the lungs every 4 (four) hours as needed (coughing, wheezing, chest tightness). Do not exceed 12 puffs in 24 hours. 10.7 g 2   atorvastatin  (LIPITOR) 20 MG tablet Take 1 tablet (20 mg total) by mouth daily. 90 tablet 3   metFORMIN  (GLUCOPHAGE ) 500 MG tablet Take 1 tablet (500 mg total) by mouth 2 (two) times daily with a meal. 180 tablet 1   amLODipine  (NORVASC ) 10 MG tablet Take 1 tablet (10 mg total) by mouth daily. 90 tablet 1   hydrochlorothiazide  (HYDRODIURIL ) 25 MG tablet Take 1 tablet (25 mg total) by mouth daily. 90 tablet 1   albuterol  (VENTOLIN  HFA) 108 (90 Base) MCG/ACT inhaler Inhale 2 puffs into the lungs every 4 (four) hours as needed for wheezing or shortness of breath (coughing fits). (Patient not taking: Reported on 07/20/2024) 6.7 g 1   budesonide -formoterol  (BREYNA ) 80-4.5 MCG/ACT inhaler Inhale 2 puffs into the lungs in the morning and at bedtime. with spacer and rinse mouth afterwards. (Patient not taking: Reported on 07/20/2024) 10.2 g 5    EPINEPHrine  0.3 mg/0.3 mL IJ SOAJ injection Inject 0.3 mg into the muscle as needed for anaphylaxis. (Patient not taking: Reported on 07/20/2024) 2 each 1   hydrocortisone  2.5 % ointment Apply topically 2 (two) times daily. Apply to affected areas daily for two weeks and then 3 times a week MWF thereafter (Patient not taking: Reported on 07/20/2024) 60 g 2   levocetirizine (XYZAL ) 5 MG tablet Take 1 tablet (5 mg total) by mouth every evening. (Patient not taking: Reported on 07/20/2024) 30  tablet 5   tirzepatide  (MOUNJARO ) 2.5 MG/0.5ML Pen Inject 2.5 mg into the skin once a week. (Patient not taking: Reported on 07/20/2024) 6 mL 1   triamcinolone  ointment (KENALOG ) 0.5 % Apply 1 application  topically 2 (two) times daily. (Patient not taking: Reported on 07/20/2024) 30 g 0   valsartan  (DIOVAN ) 80 MG tablet Take 1 tablet (80 mg total) by mouth daily. 90 tablet 1   No facility-administered medications prior to visit.    Allergies[1]  ROS Review of Systems  Constitutional:  Negative for appetite change, chills, fatigue and fever.  HENT:  Negative for congestion, postnasal drip, rhinorrhea and sneezing.   Respiratory:  Negative for cough, shortness of breath and wheezing.   Cardiovascular:  Negative for chest pain, palpitations and leg swelling.  Gastrointestinal:  Negative for abdominal pain, constipation, nausea and vomiting.  Genitourinary:  Negative for difficulty urinating, dysuria, flank pain and frequency.  Musculoskeletal:  Negative for arthralgias, back pain, joint swelling and myalgias.  Skin:  Negative for color change, pallor, rash and wound.  Neurological:  Negative for dizziness, facial asymmetry, weakness, numbness and headaches.  Psychiatric/Behavioral:  Negative for behavioral problems, confusion, self-injury and suicidal ideas.       Objective:    Physical Exam Vitals and nursing note reviewed.  Constitutional:      General: He is not in acute distress.    Appearance:  Normal appearance. He is obese. He is not ill-appearing, toxic-appearing or diaphoretic.  HENT:     Mouth/Throat:     Mouth: Mucous membranes are moist.     Pharynx: Oropharynx is clear. No oropharyngeal exudate or posterior oropharyngeal erythema.  Eyes:     General: No scleral icterus.       Right eye: No discharge.        Left eye: No discharge.     Extraocular Movements: Extraocular movements intact.     Conjunctiva/sclera: Conjunctivae normal.  Cardiovascular:     Rate and Rhythm: Normal rate and regular rhythm.     Pulses: Normal pulses.     Heart sounds: Normal heart sounds. No murmur heard.    No friction rub. No gallop.  Pulmonary:     Effort: Pulmonary effort is normal. No respiratory distress.     Breath sounds: Normal breath sounds. No stridor. No wheezing, rhonchi or rales.  Chest:     Chest wall: No tenderness.  Abdominal:     General: There is no distension.     Palpations: Abdomen is soft.     Tenderness: There is no abdominal tenderness. There is no right CVA tenderness, left CVA tenderness or guarding.  Musculoskeletal:        General: No swelling, tenderness, deformity or signs of injury.     Right lower leg: No edema.     Left lower leg: No edema.  Skin:    General: Skin is warm and dry.     Capillary Refill: Capillary refill takes 2 to 3 seconds.     Coloration: Skin is not jaundiced or pale.     Findings: No bruising, erythema or lesion.  Neurological:     Mental Status: He is alert and oriented to person, place, and time.     Motor: No weakness.     Coordination: Coordination normal.     Gait: Gait normal.  Psychiatric:        Mood and Affect: Mood normal.        Behavior: Behavior normal.  Thought Content: Thought content normal.        Judgment: Judgment normal.     BP (!) 128/97   Pulse 73   Temp 98.1 F (36.7 C)   Wt (!) 314 lb 9.6 oz (142.7 kg)   SpO2 99%   BMI 52.35 kg/m  Wt Readings from Last 3 Encounters:  07/20/24 (!) 314  lb 9.6 oz (142.7 kg)  04/15/24 294 lb 4.8 oz (133.5 kg)  03/20/24 289 lb 3.2 oz (131.2 kg)    Lab Results  Component Value Date   TSH 1.430 12/11/2019   Lab Results  Component Value Date   WBC 6.6 12/31/2023   HGB 13.8 12/31/2023   HCT 41.3 12/31/2023   MCV 83 12/31/2023   PLT 304 12/31/2023   Lab Results  Component Value Date   NA 137 02/26/2024   K 3.5 02/26/2024   CO2 22 02/26/2024   GLUCOSE 93 02/26/2024   BUN 16 02/26/2024   CREATININE 1.27 02/26/2024   BILITOT 1.0 12/17/2023   ALKPHOS 103 12/17/2023   AST 19 12/17/2023   ALT 19 12/17/2023   PROT 7.4 12/17/2023   ALBUMIN 4.4 12/17/2023   CALCIUM  9.8 02/26/2024   ANIONGAP 14 06/11/2022   EGFR 71 02/26/2024   Lab Results  Component Value Date   CHOL 115 12/17/2023   Lab Results  Component Value Date   HDL 32 (L) 12/17/2023   Lab Results  Component Value Date   LDLCALC 63 12/17/2023   Lab Results  Component Value Date   TRIG 106 12/17/2023   Lab Results  Component Value Date   CHOLHDL 3.6 12/17/2023   Lab Results  Component Value Date   HGBA1C 6.2 (A) 07/20/2024      Assessment & Plan:   Problem List Items Addressed This Visit       Cardiovascular and Mediastinum   Essential hypertension   Diastolic blood pressure is elevated but has been previously well-controlled ,continue amlodipine  10 mg daily, hydrochlorothiazide  25 mg daily, valsartan  80 mg daily No changes made to medication today will recheck blood pressure in 4 weeks and adjust medication if blood pressure remains elevated DASH diet and commitment to daily physical activity for a minimum of 30 minutes discussed and encouraged, as a part of hypertension management. The importance of attaining a healthy weight is also discussed.     07/20/2024    9:44 AM 04/15/2024    1:31 PM 03/20/2024    9:05 AM 03/17/2024    1:14 PM 02/12/2024   10:24 AM 02/12/2024   10:14 AM 12/17/2023    2:50 PM  BP/Weight  Systolic BP 129 114 110 155 146 146  136  Diastolic BP 87 68 60 104 98 98 94  Wt. (Lbs) 314.6 294.3 289.2   291   BMI 52.35 kg/m2 48.97 kg/m2 48.13 kg/m2   48.42 kg/m2            Relevant Medications   valsartan  (DIOVAN ) 80 MG tablet   hydrochlorothiazide  (HYDRODIURIL ) 25 MG tablet   amLODipine  (NORVASC ) 10 MG tablet   Other Relevant Orders   Basic Metabolic Panel     Endocrine   Controlled type 2 diabetes mellitus with complication, without long-term current use of insulin (HCC)   Managed with metformin  500 mg twice daily. Interest in tirzepatide  for weight loss and diabetes control, pending affordability and insurance. His current insurance will be inactive soon, will restart mounjaro  once he gets a new insurance  Continue metformin  500 mg  twice daily - Advised to reschedule eye exam once insurance is renewed.       Relevant Medications   valsartan  (DIOVAN ) 80 MG tablet   Other Relevant Orders   Microalbumin / creatinine urine ratio   POCT glycosylated hemoglobin (Hb A1C) (Completed)     Other   Morbid obesity with BMI of 50.0-59.9, adult (HCC)   Wt Readings from Last 3 Encounters:  07/20/24 (!) 314 lb 9.6 oz (142.7 kg)  04/15/24 294 lb 4.8 oz (133.5 kg)  03/20/24 289 lb 3.2 oz (131.2 kg)   Body mass index is 52.35 kg/m.  Patient counseled on low-carb diet Encouraged regular moderate to vigorous exercise at least 150 minutes weekly as tolerated      Need for influenza vaccination   Dyslipidemia, goal LDL below 70 - Primary   On atorvastatin  20 mg daily Rechecking lipid panel Lab Results  Component Value Date   CHOL 115 12/17/2023   HDL 32 (L) 12/17/2023   LDLCALC 63 12/17/2023   TRIG 106 12/17/2023   CHOLHDL 3.6 12/17/2023         Relevant Medications   valsartan  (DIOVAN ) 80 MG tablet   hydrochlorothiazide  (HYDRODIURIL ) 25 MG tablet   amLODipine  (NORVASC ) 10 MG tablet   Other Relevant Orders   Lipid panel   Screening for colon cancer   Relevant Orders   Cologuard    Meds ordered this  encounter  Medications   valsartan  (DIOVAN ) 80 MG tablet    Sig: Take 1 tablet (80 mg total) by mouth daily.    Dispense:  30 tablet    Refill:  1   hydrochlorothiazide  (HYDRODIURIL ) 25 MG tablet    Sig: Take 1 tablet (25 mg total) by mouth daily.    Dispense:  90 tablet    Refill:  1   amLODipine  (NORVASC ) 10 MG tablet    Sig: Take 1 tablet (10 mg total) by mouth daily.    Dispense:  90 tablet    Refill:  1    Follow-up: Return in about 4 weeks (around 08/17/2024) for HTN.    Vershawn Westrup R Steffen Hase, FNP     [1]  Allergies Allergen Reactions   Crab Extract    Lisinopril      coughing   Shellfish Allergy     Shrimp Extract    "

## 2024-07-21 ENCOUNTER — Ambulatory Visit: Payer: Self-pay | Admitting: Nurse Practitioner

## 2024-07-21 LAB — BASIC METABOLIC PANEL WITH GFR
BUN/Creatinine Ratio: 9 (ref 9–20)
BUN: 11 mg/dL (ref 6–24)
CO2: 23 mmol/L (ref 20–29)
Calcium: 9.9 mg/dL (ref 8.7–10.2)
Chloride: 93 mmol/L — ABNORMAL LOW (ref 96–106)
Creatinine, Ser: 1.17 mg/dL (ref 0.76–1.27)
Glucose: 93 mg/dL (ref 70–99)
Potassium: 3.8 mmol/L (ref 3.5–5.2)
Sodium: 135 mmol/L (ref 134–144)
eGFR: 78 mL/min/1.73

## 2024-07-21 LAB — LIPID PANEL
Chol/HDL Ratio: 2.9 ratio (ref 0.0–5.0)
Cholesterol, Total: 115 mg/dL (ref 100–199)
HDL: 40 mg/dL
LDL Chol Calc (NIH): 59 mg/dL (ref 0–99)
Triglycerides: 80 mg/dL (ref 0–149)
VLDL Cholesterol Cal: 16 mg/dL (ref 5–40)

## 2024-07-21 LAB — MICROALBUMIN / CREATININE URINE RATIO
Creatinine, Urine: 121.1 mg/dL
Microalb/Creat Ratio: 17 mg/g{creat} (ref 0–29)
Microalbumin, Urine: 20.3 ug/mL

## 2024-07-28 ENCOUNTER — Other Ambulatory Visit (HOSPITAL_COMMUNITY): Payer: Self-pay

## 2024-07-31 ENCOUNTER — Other Ambulatory Visit (HOSPITAL_COMMUNITY): Payer: Self-pay

## 2024-07-31 ENCOUNTER — Telehealth (HOSPITAL_COMMUNITY): Payer: Self-pay

## 2024-07-31 NOTE — Telephone Encounter (Signed)
 Medication: Airsupra  Able to fill? No Prior authorization required? Yes Co-pay before assistance: N/A

## 2024-08-03 ENCOUNTER — Telehealth (HOSPITAL_COMMUNITY): Payer: Self-pay

## 2024-08-03 ENCOUNTER — Other Ambulatory Visit (HOSPITAL_COMMUNITY): Payer: Self-pay

## 2024-08-03 NOTE — Telephone Encounter (Signed)
 PA request has been Received. New Encounter has been or will be created for follow up. For additional info see Pharmacy Prior Auth telephone encounter from 08/03/24.

## 2024-08-03 NOTE — Telephone Encounter (Signed)
 Pharmacy Patient Advocate Encounter   Received notification from Pt Calls Messages that prior authorization for Airsupra  90-80 MCG/ACT is required/requested.   Insurance verification completed.   The patient is insured through [employer].   Per test claim: Per test claim, medication is not covered due to plan/benefit exclusion, PA not submitted at this time

## 2024-08-04 LAB — COLOGUARD: COLOGUARD: NEGATIVE

## 2024-08-11 ENCOUNTER — Other Ambulatory Visit: Payer: Self-pay

## 2024-08-11 ENCOUNTER — Other Ambulatory Visit (HOSPITAL_COMMUNITY): Payer: Self-pay

## 2024-08-12 ENCOUNTER — Other Ambulatory Visit (HOSPITAL_COMMUNITY): Payer: Self-pay

## 2024-08-12 ENCOUNTER — Ambulatory Visit: Admitting: Allergy

## 2024-08-12 ENCOUNTER — Encounter: Payer: Self-pay | Admitting: Allergy

## 2024-08-12 VITALS — BP 132/92 | HR 83 | Temp 98.0°F | Wt 308.4 lb

## 2024-08-12 DIAGNOSIS — J3089 Other allergic rhinitis: Secondary | ICD-10-CM

## 2024-08-12 DIAGNOSIS — J302 Other seasonal allergic rhinitis: Secondary | ICD-10-CM | POA: Diagnosis not present

## 2024-08-12 DIAGNOSIS — H1010 Acute atopic conjunctivitis, unspecified eye: Secondary | ICD-10-CM

## 2024-08-12 DIAGNOSIS — R21 Rash and other nonspecific skin eruption: Secondary | ICD-10-CM

## 2024-08-12 DIAGNOSIS — T7819XD Other adverse food reactions, not elsewhere classified, subsequent encounter: Secondary | ICD-10-CM | POA: Diagnosis not present

## 2024-08-12 DIAGNOSIS — J4540 Moderate persistent asthma, uncomplicated: Secondary | ICD-10-CM

## 2024-08-12 MED ORDER — ALBUTEROL SULFATE HFA 108 (90 BASE) MCG/ACT IN AERS
2.0000 | INHALATION_SPRAY | RESPIRATORY_TRACT | 1 refills | Status: AC | PRN
  Filled 2024-08-12: qty 6.7, 20d supply, fill #0

## 2024-08-12 MED ORDER — BUDESONIDE-FORMOTEROL FUMARATE 160-4.5 MCG/ACT IN AERO
2.0000 | INHALATION_SPRAY | Freq: Two times a day (BID) | RESPIRATORY_TRACT | 5 refills | Status: DC
  Filled 2024-08-12: qty 10.2, 30d supply, fill #0

## 2024-08-12 NOTE — Patient Instructions (Addendum)
 Environmental allergies: 2022 skin testing positive to grass, trees, ragweed, weed pollen, dust mites, cockroach. Borderline to cat and dog.  Continue environmental control measures. Use over the counter antihistamines such as Zyrtec  (cetirizine ), Claritin (loratadine), Allegra (fexofenadine), or Xyzal  (levocetirizine) daily as needed. May take twice a day during allergy  flares. May switch antihistamines every few months.  Food allergy : Continue strict avoidance of shellfish and mollusks For mild symptoms you can take over the counter antihistamines (zyrtec  10mg  to 20mg ) and monitor symptoms closely.  If symptoms worsen or if you have severe symptoms including breathing issues, throat closure, significant swelling, whole body hives, severe diarrhea and vomiting, lightheadedness then use epinephrine  and seek immediate medical care afterwards. Emergency action plan in place.   Asthma:  Daily controller medication(s): start Breyna  160mcg 2 puffs twice a day with spacer and rinse mouth afterwards. Let me know if this is not covered. May use albuterol  rescue inhaler 2 puffs  every 4 to 6 hours as needed for shortness of breath, chest tightness, coughing, and wheezing.  Monitor frequency of use - if you need to use it more than twice per week on a consistent basis let us  know.  Breathing control goals:  Full participation in all desired activities (may need albuterol  before activity) Albuterol  use two times or less a week on average (not counting use with activity) Cough interfering with sleep two times or less a month Oral steroids no more than once a year No hospitalizations Skin: Monitor symptoms.  Continue proper skin care.  Keep follow up with dermatology.   Return in about 4 months (around 12/10/2024). Or sooner if needed.

## 2024-08-12 NOTE — Progress Notes (Signed)
 "  Follow Up Note  RE: Edward Carr MRN: [id number] DOB: [birth year] Date of Office Visit: 08/12/2024  Referring provider: Paseda, Folashade R, FNP Primary care provider: Paseda, Folashade R, FNP  Chief Complaint: Follow-up  History of Present Illness: I had the pleasure of seeing Edward Carr for a follow up visit at the [facility] on 08/12/2024. He is a 49 y.o. male, who is being followed for asthma, allergic rhino conjunctivitis, food allergy , facial rash. His previous allergy  office visit was on 04/15/2024 with Dr. Luke. Today is a regular follow up visit.  Discussed the use of AI scribe software for clinical note transcription with the patient, who gave verbal consent to proceed.    He experiences breathing difficulties mostly at night, about three to four nights a week. He recently started using a new inhaler, Breyna  , at a dose of two puffs twice a day, but is unsure if it has made a difference. He has run out of his rescue inhaler, Air Supra, and was unable to get a refill due to insurance coverage issues.  He has a history of skin issues and was supposed to get more shampoo from the dermatologist, but could not access it through MyChart. He found a new shampoo that has helped his skin improve slightly, though not to his satisfaction. He avoids shellfish and mollusks due to allergies and does not have an EpiPen  due to cost.  He is not currently taking any allergy  medications as he has not needed them recently. He mentions that he might lose his current insurance soon due to affordability issues. He is currently looking for work. He does not have any new medications, surgeries, or medical diagnoses since his last visit.     Assessment and Plan: Edward Carr is a 49 y.o. male with: Moderate persistent asthma without complication Past history - asthma like symptoms for 30+ years. Main triggers are exertion and panic attacks. Uses albuterol  once a week with good benefit.  2025 spirometry showed mixed obstructive and restrictive disease with 78% improvement in FEV1 post bronchodilator treatment. Clinically feeling improved.  Interim history - not sure if Breyna  is helping as still feels like he needs to use his rescue inhaler 3-4 times per week. Airsupra  not covered. Today's spirometry showed mixed restriction and obstruction.  Daily controller medication(s): start Breyna  160mcg 2 puffs twice a day with spacer and rinse mouth afterwards. Let me know if this is not covered. May use albuterol  rescue inhaler 2 puffs  every 4 to 6 hours as needed for shortness of breath, chest tightness, coughing, and wheezing.  Monitor frequency of use - if you need to use it more than twice per week on a consistent basis let us  know.  Get spirometry at next visit.   Seasonal and perennial allergic rhinoconjunctivitis Past history - 2022 skin testing positive to grass, trees, ragweed, weed pollen, dust mites, cockroach. Borderline to cat and dog.  Interim history - stable without any meds currently. Continue environmental control measures. Use over the counter antihistamines such as Zyrtec  (cetirizine ), Claritin (loratadine), Allegra (fexofenadine), or Xyzal  (levocetirizine) daily as needed. May take twice a day during allergy  flares. May switch antihistamines every few months.   Other adverse food reactions, not elsewhere classified, subsequent encounter Past history - delayed reaction to shrimp as a child in the form of right eye swelling. Resolved without any intervention. Then had another episode of eye swelling as an adult but not sure of shellfish exposure. Tolerates finned  fish with no issues. 2022 skin testing positive to shellfish, shrimp, crab, lobster, oyster.  Interim history - unable to get neffy /epinephrine . No reactions.  Continue strict avoidance of shellfish and mollusks For mild symptoms you can take over the counter antihistamines (zyrtec  10mg  to 20mg ) and monitor  symptoms closely.  If symptoms worsen or if you have severe symptoms including breathing issues, throat closure, significant swelling, whole body hives, severe diarrhea and vomiting, lightheadedness then use epinephrine  and seek immediate medical care afterwards. Emergency action plan in place.    Facial rash Past history - follows with derm.  Monitor symptoms.  Continue proper skin care.  Keep follow up with dermatology.   Return in about 4 months (around 12/10/2024).  Meds ordered this encounter  Medications   budesonide -formoterol  (BREYNA ) 160-4.5 MCG/ACT inhaler    Sig: Inhale 2 puffs into the lungs in the morning and at bedtime. with spacer and rinse mouth afterwards.    Dispense:  10.2 g    Refill:  5   albuterol  (VENTOLIN  HFA) 108 (90 Base) MCG/ACT inhaler    Sig: Inhale 2 puffs into the lungs every 4 (four) hours as needed for wheezing or shortness of breath (coughing fits).    Dispense:  6.7 g    Refill:  1   Lab Orders  No laboratory test(s) ordered today    Diagnostics: Spirometry:  Tracings reviewed. His effort: Good reproducible efforts. FVC: 2.33L FEV1: 1.57L, 53% predicted FEV1/FVC ratio: 67% Interpretation: Spirometry consistent with mixed obstructive and restrictive disease.  Please see scanned spirometry results for details.  Results discussed with patient/family.   Medication List:  Current Outpatient Medications  Medication Sig Dispense Refill   albuterol  (VENTOLIN  HFA) 108 (90 Base) MCG/ACT inhaler Inhale 2 puffs into the lungs every 4 (four) hours as needed for wheezing or shortness of breath (coughing fits). 6.7 g 1   amLODipine  (NORVASC ) 10 MG tablet Take 1 tablet (10 mg total) by mouth daily. 90 tablet 1   atorvastatin  (LIPITOR) 20 MG tablet Take 1 tablet (20 mg total) by mouth daily. 90 tablet 3   budesonide -formoterol  (BREYNA ) 160-4.5 MCG/ACT inhaler Inhale 2 puffs into the lungs in the morning and at bedtime. with spacer and rinse mouth  afterwards. 10.2 g 5   hydrochlorothiazide  (HYDRODIURIL ) 25 MG tablet Take 1 tablet (25 mg total) by mouth daily. 90 tablet 1   hydrocortisone  2.5 % ointment Apply topically 2 (two) times daily. Apply to affected areas daily for two weeks and then 3 times a week MWF thereafter 60 g 2   metFORMIN  (GLUCOPHAGE ) 500 MG tablet Take 1 tablet (500 mg total) by mouth 2 (two) times daily with a meal. 180 tablet 1   valsartan  (DIOVAN ) 80 MG tablet Take 1 tablet (80 mg total) by mouth daily. 30 tablet 1   EPINEPHrine  0.3 mg/0.3 mL IJ SOAJ injection Inject 0.3 mg into the muscle as needed for anaphylaxis. (Patient not taking: Reported on 08/12/2024) 2 each 1   levocetirizine (XYZAL ) 5 MG tablet Take 1 tablet (5 mg total) by mouth every evening. (Patient not taking: Reported on 08/12/2024) 30 tablet 5   tirzepatide  (MOUNJARO ) 2.5 MG/0.5ML Pen Inject 2.5 mg into the skin once a week. (Patient not taking: Reported on 08/12/2024) 6 mL 1   triamcinolone  ointment (KENALOG ) 0.5 % Apply 1 application  topically 2 (two) times daily. (Patient not taking: Reported on 08/12/2024) 30 g 0   No current facility-administered medications for this visit.   Allergies: Allergies[1] I reviewed  his past medical history, social history, family history, and environmental history and no significant changes have been reported from his previous visit.  Review of Systems  Constitutional:  Negative for appetite change, chills, fever and unexpected weight change.  HENT:  Negative for congestion, postnasal drip and rhinorrhea.   Eyes:  Negative for itching.  Respiratory:  Negative for cough, chest tightness, shortness of breath and wheezing.   Cardiovascular:  Negative for chest pain.  Gastrointestinal:  Negative for abdominal pain.  Genitourinary:  Negative for difficulty urinating.  Skin:  Positive for rash.  Allergic/Immunologic: Positive for environmental allergies and food allergies.  Neurological:  Negative for headaches.     Objective: BP (!) 132/92   Pulse 83   Temp 98 F (36.7 C)   Wt (!) 308 lb 6 oz (139.9 kg)   SpO2 95%   BMI 51.32 kg/m  Body mass index is 51.32 kg/m. Physical Exam Vitals and nursing note reviewed.  Constitutional:      Appearance: Normal appearance. He is well-developed. He is obese.  HENT:     Head: Normocephalic and atraumatic.     Right Ear: Tympanic membrane and external ear normal.     Left Ear: Tympanic membrane and external ear normal.     Nose: Nose normal.     Mouth/Throat:     Mouth: Mucous membranes are moist.     Pharynx: Oropharynx is clear.  Eyes:     Conjunctiva/sclera: Conjunctivae normal.  Cardiovascular:     Rate and Rhythm: Normal rate and regular rhythm.     Heart sounds: Normal heart sounds. No murmur heard.    No friction rub. No gallop.  Pulmonary:     Effort: Pulmonary effort is normal.     Breath sounds: Normal breath sounds. No wheezing, rhonchi or rales.  Musculoskeletal:     Cervical back: Neck supple.  Skin:    General: Skin is warm.     Comments: Erythematous facial hue  Neurological:     Mental Status: He is alert and oriented to person, place, and time.  Psychiatric:        Behavior: Behavior normal.    Previous notes and tests were reviewed. The plan was reviewed with the patient/family, and all questions/concerned were addressed.  It was my pleasure to see Edward Carr today and participate in his care. Please feel free to contact me with any questions or concerns.  Sincerely,  Orlan Cramp, DO Allergy  & Immunology  [facility]  [facility] office: [phone] [facility] office: [phone]    [1]  Allergies Allergen Reactions   Crab Extract    Lisinopril      coughing   Shellfish Allergy     Shrimp Extract    "

## 2024-08-13 ENCOUNTER — Encounter: Payer: Self-pay | Admitting: Allergy

## 2024-08-13 ENCOUNTER — Other Ambulatory Visit (HOSPITAL_COMMUNITY): Payer: Self-pay

## 2024-08-14 ENCOUNTER — Other Ambulatory Visit: Payer: Self-pay

## 2024-08-14 MED ORDER — BUDESONIDE-FORMOTEROL FUMARATE 160-4.5 MCG/ACT IN AERO
2.0000 | INHALATION_SPRAY | Freq: Two times a day (BID) | RESPIRATORY_TRACT | 5 refills | Status: AC
  Filled 2024-08-14: qty 1, fill #0

## 2024-08-14 NOTE — Addendum Note (Signed)
 Addended by: Varshini Arrants M on: 08/14/2024 02:32 PM   Modules accepted: Orders

## 2024-08-17 ENCOUNTER — Encounter: Payer: Self-pay | Admitting: Nurse Practitioner

## 2024-08-17 ENCOUNTER — Other Ambulatory Visit (HOSPITAL_COMMUNITY): Payer: Self-pay

## 2024-08-17 ENCOUNTER — Other Ambulatory Visit: Payer: Self-pay

## 2024-08-17 ENCOUNTER — Ambulatory Visit: Payer: Self-pay | Admitting: Nurse Practitioner

## 2024-08-17 VITALS — BP 128/79 | HR 84 | Wt 307.0 lb

## 2024-08-17 DIAGNOSIS — L219 Seborrheic dermatitis, unspecified: Secondary | ICD-10-CM

## 2024-08-17 DIAGNOSIS — J4520 Mild intermittent asthma, uncomplicated: Secondary | ICD-10-CM | POA: Diagnosis not present

## 2024-08-17 DIAGNOSIS — E785 Hyperlipidemia, unspecified: Secondary | ICD-10-CM | POA: Diagnosis not present

## 2024-08-17 DIAGNOSIS — E118 Type 2 diabetes mellitus with unspecified complications: Secondary | ICD-10-CM

## 2024-08-17 DIAGNOSIS — I10 Essential (primary) hypertension: Secondary | ICD-10-CM | POA: Diagnosis not present

## 2024-08-17 MED ORDER — VALSARTAN 80 MG PO TABS
80.0000 mg | ORAL_TABLET | Freq: Every day | ORAL | 1 refills | Status: AC
  Filled 2024-08-17: qty 90, 90d supply, fill #0

## 2024-08-17 MED ORDER — KETOCONAZOLE 2 % EX SHAM
1.0000 | MEDICATED_SHAMPOO | CUTANEOUS | 1 refills | Status: AC
  Filled 2024-08-17: qty 120, 84d supply, fill #0
  Filled 2024-08-17 – 2024-08-18 (×2): qty 120, 30d supply, fill #0
  Filled 2024-08-18: qty 120, 84d supply, fill #0
  Filled 2024-08-18: qty 120, 30d supply, fill #0

## 2024-08-17 MED ORDER — METFORMIN HCL 500 MG PO TABS
500.0000 mg | ORAL_TABLET | Freq: Two times a day (BID) | ORAL | 1 refills | Status: AC
  Filled 2024-08-17: qty 180, 90d supply, fill #0

## 2024-08-17 MED ORDER — ATORVASTATIN CALCIUM 20 MG PO TABS
20.0000 mg | ORAL_TABLET | Freq: Every day | ORAL | 3 refills | Status: AC
  Filled 2024-08-17: qty 90, 90d supply, fill #0

## 2024-08-17 NOTE — Progress Notes (Signed)
 "  Established Patient Office Visit  Subjective:  Patient ID: Edward Carr, male    DOB: [birth year]  Age: 49 y.o. MRN: [id number]  CC:  Chief Complaint  Patient presents with   Hyperlipidemia    Follow up fasting    HPI   Discussed the use of AI scribe software for clinical note transcription with the patient, who gave verbal consent to proceed.  History of Present Illness Edward Carr is a 49 year old male  has a past medical history of Asthma, Diabetes mellitus without complication (HCC), Essential hypertension (08/22/2016), Hypertension, and Morbid obesity with BMI of 50.0-59.9, adult (HCC) (12/13/2020).   who presents for a follow-up on blood pressure management.  His blood pressure was measured at 128/79 mmHg. He is taking amlodipine  10 mg daily, valsartan  80 mg daily, and hydrochlorothiazide  25 mg daily.  He has diabetes and is taking metformin  500 mg twice daily. His last A1c was 6.2.  He has asthma and uses an albuterol  inhaler as needed. He has not obtained his prescribed Symbicort  inhaler from the pharmacy. He reports that the albuterol  inhaler is working for him.  He recently completed a Cologuard test for colon cancer screening, which returned normal results.   He has not yet seen an eye doctor due to the high copay cost, which is over $100.     Assessment & Plan   Past Medical History:  Diagnosis Date   Asthma    Diabetes mellitus without complication (HCC)    Essential hypertension 08/22/2016   Hypertension    Morbid obesity with BMI of 50.0-59.9, adult (HCC) 12/13/2020    Past Surgical History:  Procedure Laterality Date   DENTAL SURGERY      Family History  Problem Relation Age of Onset   Diabetes Mother    Cancer Father    Cancer Sister    Asthma Sister     Social History   Socioeconomic History   Marital status: Single    Spouse name: Not on file   Number of children: Not on file   Years of education: Not on file   Highest education  level: Bachelor's degree (e.g., BA, AB, BS)  Occupational History   Not on file  Tobacco Use   Smoking status: Never   Smokeless tobacco: Never  Vaping Use   Vaping status: Never Used  Substance and Sexual Activity   Alcohol use: No   Drug use: No   Sexual activity: Never  Other Topics Concern   Not on file  Social History Narrative   Not on file   Social Drivers of Health   Tobacco Use: Low Risk (08/17/2024)   Patient History    Smoking Tobacco Use: Never    Smokeless Tobacco Use: Never    Passive Exposure: Not on file  Financial Resource Strain: Medium Risk (07/20/2024)   Overall Financial Resource Strain (CARDIA)    Difficulty of Paying Living Expenses: Somewhat hard  Food Insecurity: No Food Insecurity (08/17/2024)   Epic    Worried About Programme Researcher, Broadcasting/film/video in the Last Year: Never true    Ran Out of Food in the Last Year: Never true  Recent Concern: Food Insecurity - Food Insecurity Present (07/20/2024)   Epic    Worried About Programme Researcher, Broadcasting/film/video in the Last Year: Sometimes true    Ran Out of Food in the Last Year: Sometimes true  Transportation Needs: No Transportation Needs (08/17/2024)   Epic    Lack of Transportation (Medical):  No    Lack of Transportation (Non-Medical): No  Physical Activity: Insufficiently Active (07/20/2024)   Exercise Vital Sign    Days of Exercise per Week: 3 days    Minutes of Exercise per Session: 10 min  Stress: No Stress Concern Present (07/20/2024)   [employer] of Occupational Health - Occupational Stress Questionnaire    Feeling of Stress: Only a little  Social Connections: Unknown (07/20/2024)   Social Connection and Isolation Panel    Frequency of Communication with Friends and Family: Patient declined    Frequency of Social Gatherings with Friends and Family: Once a week    Attends Religious Services: Never    Database Administrator or Organizations: No    Attends Engineer, Structural: Not on file    Marital  Status: Never married  Intimate Partner Violence: Not At Risk (08/17/2024)   Epic    Fear of Current or Ex-Partner: No    Emotionally Abused: No    Physically Abused: No    Sexually Abused: No  Depression (PHQ2-9): Low Risk (08/17/2024)   Depression (PHQ2-9)    PHQ-2 Score: 0  Alcohol Screen: Not on file  Housing: Low Risk (08/17/2024)   Epic    Unable to Pay for Housing in the Last Year: No    Number of Times Moved in the Last Year: 0    Homeless in the Last Year: No  Utilities: Not At Risk (08/17/2024)   Epic    Threatened with loss of utilities: No  Health Literacy: Not on file    Outpatient Medications Prior to Visit  Medication Sig Dispense Refill   albuterol  (VENTOLIN  HFA) 108 (90 Base) MCG/ACT inhaler Inhale 2 puffs into the lungs every 4 (four) hours as needed for wheezing or shortness of breath (coughing fits). 6.7 g 1   amLODipine  (NORVASC ) 10 MG tablet Take 1 tablet (10 mg total) by mouth daily. 90 tablet 1   hydrochlorothiazide  (HYDRODIURIL ) 25 MG tablet Take 1 tablet (25 mg total) by mouth daily. 90 tablet 1   hydrocortisone  2.5 % ointment Apply topically 2 (two) times daily. Apply to affected areas daily for two weeks and then 3 times a week MWF thereafter 60 g 2   atorvastatin  (LIPITOR) 20 MG tablet Take 1 tablet (20 mg total) by mouth daily. 90 tablet 3   ketoconazole  (NIZORAL ) 2 % shampoo Apply 1 Application topically 2 (two) times a week.     metFORMIN  (GLUCOPHAGE ) 500 MG tablet Take 1 tablet (500 mg total) by mouth 2 (two) times daily with a meal. 180 tablet 1   valsartan  (DIOVAN ) 80 MG tablet Take 1 tablet (80 mg total) by mouth daily. 30 tablet 1   budesonide -formoterol  (SYMBICORT ) 160-4.5 MCG/ACT inhaler Inhale 2 puffs into the lungs in the morning and at bedtime. with spacer and rinse mouth afterwards. (Patient not taking: Reported on 08/17/2024) 10.2 g 5   EPINEPHrine  0.3 mg/0.3 mL IJ SOAJ injection Inject 0.3 mg into the muscle as needed for anaphylaxis. (Patient  not taking: Reported on 08/12/2024) 2 each 1   levocetirizine (XYZAL ) 5 MG tablet Take 1 tablet (5 mg total) by mouth every evening. (Patient not taking: Reported on 08/12/2024) 30 tablet 5   tirzepatide  (MOUNJARO ) 2.5 MG/0.5ML Pen Inject 2.5 mg into the skin once a week. (Patient not taking: Reported on 08/12/2024) 6 mL 1   triamcinolone  ointment (KENALOG ) 0.5 % Apply 1 application  topically 2 (two) times daily. (Patient not taking: Reported on 08/12/2024) 30  g 0   No facility-administered medications prior to visit.    Allergies[1]  ROS Review of Systems  Constitutional:  Negative for appetite change, chills, fatigue and fever.  HENT:  Negative for congestion, postnasal drip, rhinorrhea and sneezing.   Respiratory:  Negative for cough, shortness of breath and wheezing.   Cardiovascular:  Negative for chest pain, palpitations and leg swelling.  Gastrointestinal:  Negative for abdominal pain, constipation, nausea and vomiting.  Genitourinary:  Negative for difficulty urinating, dysuria, flank pain and frequency.  Musculoskeletal:  Negative for arthralgias, back pain, joint swelling and myalgias.  Skin:  Negative for color change, pallor, rash and wound.  Neurological:  Negative for dizziness, facial asymmetry, weakness, numbness and headaches.  Psychiatric/Behavioral:  Negative for behavioral problems, confusion, self-injury and suicidal ideas.       Objective:    Physical Exam Vitals and nursing note reviewed.  Constitutional:      General: He is not in acute distress.    Appearance: Normal appearance. He is obese. He is not ill-appearing, toxic-appearing or diaphoretic.  Eyes:     General: No scleral icterus.       Right eye: No discharge.        Left eye: No discharge.     Extraocular Movements: Extraocular movements intact.     Conjunctiva/sclera: Conjunctivae normal.  Cardiovascular:     Rate and Rhythm: Normal rate and regular rhythm.     Pulses: Normal pulses.     Heart  sounds: Normal heart sounds. No murmur heard.    No friction rub. No gallop.  Pulmonary:     Effort: Pulmonary effort is normal. No respiratory distress.     Breath sounds: Normal breath sounds. No stridor. No wheezing, rhonchi or rales.  Chest:     Chest wall: No tenderness.  Abdominal:     General: There is no distension.     Palpations: Abdomen is soft.     Tenderness: There is no abdominal tenderness. There is no right CVA tenderness, left CVA tenderness or guarding.  Musculoskeletal:        General: No swelling, tenderness, deformity or signs of injury.     Right lower leg: No edema.     Left lower leg: No edema.  Skin:    General: Skin is warm and dry.     Capillary Refill: Capillary refill takes less than 2 seconds.     Coloration: Skin is not jaundiced or pale.     Findings: No bruising or erythema.  Neurological:     Mental Status: He is alert and oriented to person, place, and time.     Motor: No weakness.     Coordination: Coordination normal.     Gait: Gait normal.  Psychiatric:        Mood and Affect: Mood normal.        Behavior: Behavior normal.        Thought Content: Thought content normal.        Judgment: Judgment normal.     BP 128/79   Pulse 84   Wt (!) 307 lb (139.3 kg)   SpO2 98%   BMI 51.09 kg/m  Wt Readings from Last 3 Encounters:  08/17/24 (!) 307 lb (139.3 kg)  08/12/24 (!) 308 lb 6 oz (139.9 kg)  07/20/24 (!) 314 lb 9.6 oz (142.7 kg)    Lab Results  Component Value Date   TSH 1.430 12/11/2019   Lab Results  Component Value Date   WBC  6.6 12/31/2023   HGB 13.8 12/31/2023   HCT 41.3 12/31/2023   MCV 83 12/31/2023   PLT 304 12/31/2023   Lab Results  Component Value Date   NA 135 07/20/2024   K 3.8 07/20/2024   CO2 23 07/20/2024   GLUCOSE 93 07/20/2024   BUN 11 07/20/2024   CREATININE 1.17 07/20/2024   BILITOT 1.0 12/17/2023   ALKPHOS 103 12/17/2023   AST 19 12/17/2023   ALT 19 12/17/2023   PROT 7.4 12/17/2023   ALBUMIN 4.4  12/17/2023   CALCIUM  9.9 07/20/2024   ANIONGAP 14 06/11/2022   EGFR 78 07/20/2024   Lab Results  Component Value Date   CHOL 115 07/20/2024   Lab Results  Component Value Date   HDL 40 07/20/2024   Lab Results  Component Value Date   LDLCALC 59 07/20/2024   Lab Results  Component Value Date   TRIG 80 07/20/2024   Lab Results  Component Value Date   CHOLHDL 2.9 07/20/2024   Lab Results  Component Value Date   HGBA1C 6.2 (A) 07/20/2024      Assessment & Plan:   Problem List Items Addressed This Visit       Cardiovascular and Mediastinum   Essential hypertension        08/17/2024   10:04 AM 08/17/2024    9:57 AM 08/12/2024    1:31 PM 07/20/2024   10:06 AM 07/20/2024    9:44 AM 04/15/2024    1:31 PM 03/20/2024    9:05 AM  BP/Weight  Systolic BP 128 128 132 128 129 114 110  Diastolic BP 79 87 92 97 87 68 60  Wt. (Lbs)  307 308.38  314.6 294.3 289.2  BMI  51.09 kg/m2 51.32 kg/m2  52.35 kg/m2 48.97 kg/m2 48.13 kg/m2   Essential hypertension Blood pressure controlled at 128/79 mmHg with current medications. - Continue amlodipine  10 mg daily, valsartan  80 mg daily, and hydrochlorothiazide  25 mg daily. - Encouraged moderate to vigorous exercise for 30 minutes, five days a week. - Advised on a heart-healthy, low salt, low fat diet.       Relevant Medications   valsartan  (DIOVAN ) 80 MG tablet   atorvastatin  (LIPITOR) 20 MG tablet     Respiratory   Asthma    Using albuterol  inhaler as needed. Awaiting Symbicort  prescription. - Continue using albuterol  inhaler as needed. - Pick up Symbicort  prescription from pharmacy.           Endocrine   Controlled type 2 diabetes mellitus with complication, without long-term current use of insulin (HCC)    A1c at 6.2%, indicating good glycemic control with current medications. - Continue metformin  500 mg twice daily.       Relevant Medications   valsartan  (DIOVAN ) 80 MG tablet   metFORMIN  (GLUCOPHAGE ) 500 MG  tablet   atorvastatin  (LIPITOR) 20 MG tablet     Other   Dyslipidemia, goal LDL below 70   Lab Results  Component Value Date   CHOL 115 07/20/2024   HDL 40 07/20/2024   LDLCALC 59 07/20/2024   TRIG 80 07/20/2024   CHOLHDL 2.9 07/20/2024   Dyslipidemia LDL cholesterol at 59 mg/dL, indicating good control with current medications. - Continue atorvastatin  20 mg daily.        Relevant Medications   valsartan  (DIOVAN ) 80 MG tablet   atorvastatin  (LIPITOR) 20 MG tablet   Other Visit Diagnoses       Seborrheic dermatitis    -  Primary   Relevant  Medications   ketoconazole  (NIZORAL ) 2 % shampoo       Meds ordered this encounter  Medications   valsartan  (DIOVAN ) 80 MG tablet    Sig: Take 1 tablet (80 mg total) by mouth daily.    Dispense:  90 tablet    Refill:  1   metFORMIN  (GLUCOPHAGE ) 500 MG tablet    Sig: Take 1 tablet (500 mg total) by mouth 2 (two) times daily with a meal.    Dispense:  180 tablet    Refill:  1   ketoconazole  (NIZORAL ) 2 % shampoo    Sig: Apply 1 Application topically 2 (two) times a week. Ketoconazole  2 % 1 Application Topical  Once, Wash face and scalp daily for 2 weeks then MWF thereafter.  Let sit for a few minutes then rinse as directed.    Dispense:  120 mL    Refill:  1   atorvastatin  (LIPITOR) 20 MG tablet    Sig: Take 1 tablet (20 mg total) by mouth daily.    Dispense:  90 tablet    Refill:  3    Follow-up: Return in about 5 months (around 01/15/2025) for CPE.    Havana Baldwin R Lakenya Riendeau, FNP    [1]  Allergies Allergen Reactions   Crab Extract    Lisinopril      coughing   Shellfish Allergy     Shrimp Extract    "

## 2024-08-17 NOTE — Assessment & Plan Note (Signed)
 Lab Results  Component Value Date   CHOL 115 07/20/2024   HDL 40 07/20/2024   LDLCALC 59 07/20/2024   TRIG 80 07/20/2024   CHOLHDL 2.9 07/20/2024   Dyslipidemia LDL cholesterol at 59 mg/dL, indicating good control with current medications. - Continue atorvastatin  20 mg daily.

## 2024-08-17 NOTE — Patient Instructions (Signed)
 1. Essential hypertension - valsartan  (DIOVAN ) 80 MG tablet; Take 1 tablet (80 mg total) by mouth daily.  Dispense: 90 tablet; Refill: 1  2. Controlled type 2 diabetes mellitus with complication, without long-term current use of insulin (HCC) - metFORMIN  (GLUCOPHAGE ) 500 MG tablet; Take 1 tablet (500 mg total) by mouth 2 (two) times daily with a meal.  Dispense: 180 tablet; Refill: 1  3. Seborrheic dermatitis (Primary) - ketoconazole  (NIZORAL ) 2 % shampoo; Apply 1 Application topically 2 (two) times a week. Ketoconazole  2 % 1 Application Topical  Once, Wash face and scalp daily for 2 weeks then MWF thereafter.  Let sit for a few minutes then rinse as directed.  Dispense: 120 mL; Refill: 1    It is important that you exercise regularly at least 30 minutes 5 times a week as tolerated  Think about what you will eat, plan ahead. Choose  clean, green, fresh or frozen over canned, processed or packaged foods which are more sugary, salty and fatty. 70 to 75% of food eaten should be vegetables and fruit. Three meals at set times with snacks allowed between meals, but they must be fruit or vegetables. Aim to eat over a 12 hour period , example 7 am to 7 pm, and STOP after  your last meal of the day. Drink water,generally about 64 ounces per day, no other drink is as healthy. Fruit juice is best enjoyed in a healthy way, by EATING the fruit.  Thanks for choosing [facility] we consider it a privelige to serve you.

## 2024-08-17 NOTE — Assessment & Plan Note (Signed)
" ° ° °    08/17/2024   10:04 AM 08/17/2024    9:57 AM 08/12/2024    1:31 PM 07/20/2024   10:06 AM 07/20/2024    9:44 AM 04/15/2024    1:31 PM 03/20/2024    9:05 AM  BP/Weight  Systolic BP 128 128 132 128 129 114 110  Diastolic BP 79 87 92 97 87 68 60  Wt. (Lbs)  307 308.38  314.6 294.3 289.2  BMI  51.09 kg/m2 51.32 kg/m2  52.35 kg/m2 48.97 kg/m2 48.13 kg/m2   Essential hypertension Blood pressure controlled at 128/79 mmHg with current medications. - Continue amlodipine  10 mg daily, valsartan  80 mg daily, and hydrochlorothiazide  25 mg daily. - Encouraged moderate to vigorous exercise for 30 minutes, five days a week. - Advised on a heart-healthy, low salt, low fat diet.  "

## 2024-08-17 NOTE — Assessment & Plan Note (Signed)
" °  Using albuterol  inhaler as needed. Awaiting Symbicort  prescription. - Continue using albuterol  inhaler as needed. - Pick up Symbicort  prescription from pharmacy.    "

## 2024-08-17 NOTE — Assessment & Plan Note (Signed)
" °  A1c at 6.2%, indicating good glycemic control with current medications. - Continue metformin  500 mg twice daily.  "

## 2024-08-18 ENCOUNTER — Other Ambulatory Visit (HOSPITAL_BASED_OUTPATIENT_CLINIC_OR_DEPARTMENT_OTHER): Payer: Self-pay

## 2024-08-18 ENCOUNTER — Other Ambulatory Visit: Payer: Self-pay

## 2024-08-18 ENCOUNTER — Other Ambulatory Visit (HOSPITAL_COMMUNITY): Payer: Self-pay

## 2024-08-19 ENCOUNTER — Other Ambulatory Visit: Payer: Self-pay

## 2024-12-15 ENCOUNTER — Ambulatory Visit: Admitting: Allergy

## 2025-01-20 ENCOUNTER — Encounter: Payer: Self-pay | Admitting: Nurse Practitioner
